# Patient Record
Sex: Male | Born: 1950 | Race: Black or African American | Hispanic: No | Smoking: Never smoker
Health system: Southern US, Community
[De-identification: ages and names within clinical notes are randomized; demographics above are authoritative.]

## PROBLEM LIST (undated history)

## (undated) ENCOUNTER — Emergency Department (HOSPITAL_COMMUNITY): Admission: EM | Payer: Medicare Other | Source: Home / Self Care

## (undated) DIAGNOSIS — G35 Multiple sclerosis: Secondary | ICD-10-CM

## (undated) DIAGNOSIS — I10 Essential (primary) hypertension: Secondary | ICD-10-CM

## (undated) DIAGNOSIS — N319 Neuromuscular dysfunction of bladder, unspecified: Secondary | ICD-10-CM

## (undated) DIAGNOSIS — Z86711 Personal history of pulmonary embolism: Secondary | ICD-10-CM

## (undated) DIAGNOSIS — G8250 Quadriplegia, unspecified: Secondary | ICD-10-CM

## (undated) HISTORY — DX: Multiple sclerosis: G35

## (undated) HISTORY — PX: TONSILLECTOMY AND ADENOIDECTOMY: SUR1326

## (undated) HISTORY — PX: OTHER SURGICAL HISTORY: SHX169

## (undated) HISTORY — DX: Neuromuscular dysfunction of bladder, unspecified: N31.9

## (undated) HISTORY — DX: Personal history of pulmonary embolism: Z86.711

---

## 1999-07-04 ENCOUNTER — Inpatient Hospital Stay (HOSPITAL_COMMUNITY): Admission: EM | Admit: 1999-07-04 | Discharge: 1999-07-07 | Payer: Self-pay | Admitting: *Deleted

## 1999-07-04 ENCOUNTER — Encounter: Payer: Self-pay | Admitting: Family Medicine

## 1999-07-06 ENCOUNTER — Encounter: Payer: Self-pay | Admitting: Internal Medicine

## 2000-03-07 ENCOUNTER — Encounter: Payer: Self-pay | Admitting: Emergency Medicine

## 2000-03-08 ENCOUNTER — Inpatient Hospital Stay (HOSPITAL_COMMUNITY): Admission: EM | Admit: 2000-03-08 | Discharge: 2000-03-09 | Payer: Self-pay | Admitting: Emergency Medicine

## 2000-12-12 ENCOUNTER — Encounter: Payer: Self-pay | Admitting: Emergency Medicine

## 2000-12-12 ENCOUNTER — Inpatient Hospital Stay (HOSPITAL_COMMUNITY): Admission: EM | Admit: 2000-12-12 | Discharge: 2000-12-21 | Payer: Self-pay | Admitting: Emergency Medicine

## 2000-12-13 ENCOUNTER — Encounter: Payer: Self-pay | Admitting: Internal Medicine

## 2000-12-17 ENCOUNTER — Encounter: Payer: Self-pay | Admitting: Internal Medicine

## 2000-12-19 ENCOUNTER — Encounter: Payer: Self-pay | Admitting: Internal Medicine

## 2001-04-18 ENCOUNTER — Ambulatory Visit (HOSPITAL_COMMUNITY): Admission: RE | Admit: 2001-04-18 | Discharge: 2001-04-18 | Payer: Self-pay | Admitting: Family Medicine

## 2001-04-18 ENCOUNTER — Encounter: Payer: Self-pay | Admitting: Family Medicine

## 2001-05-16 ENCOUNTER — Ambulatory Visit (HOSPITAL_COMMUNITY): Admission: RE | Admit: 2001-05-16 | Discharge: 2001-05-16 | Payer: Self-pay | Admitting: General Surgery

## 2002-08-21 ENCOUNTER — Encounter: Payer: Self-pay | Admitting: Emergency Medicine

## 2002-08-21 ENCOUNTER — Emergency Department (HOSPITAL_COMMUNITY): Admission: EM | Admit: 2002-08-21 | Discharge: 2002-08-21 | Payer: Self-pay | Admitting: Emergency Medicine

## 2003-04-19 ENCOUNTER — Inpatient Hospital Stay (HOSPITAL_COMMUNITY): Admission: EM | Admit: 2003-04-19 | Discharge: 2003-04-22 | Payer: Self-pay | Admitting: Emergency Medicine

## 2003-04-19 ENCOUNTER — Encounter: Payer: Self-pay | Admitting: Emergency Medicine

## 2003-04-20 ENCOUNTER — Encounter: Payer: Self-pay | Admitting: Internal Medicine

## 2003-04-21 ENCOUNTER — Encounter: Payer: Self-pay | Admitting: Internal Medicine

## 2003-07-17 ENCOUNTER — Encounter: Payer: Self-pay | Admitting: Emergency Medicine

## 2003-07-18 ENCOUNTER — Inpatient Hospital Stay (HOSPITAL_COMMUNITY): Admission: EM | Admit: 2003-07-18 | Discharge: 2003-07-22 | Payer: Self-pay | Admitting: Emergency Medicine

## 2003-07-18 ENCOUNTER — Encounter: Payer: Self-pay | Admitting: Internal Medicine

## 2003-07-21 ENCOUNTER — Encounter: Payer: Self-pay | Admitting: Internal Medicine

## 2003-10-03 ENCOUNTER — Inpatient Hospital Stay (HOSPITAL_COMMUNITY): Admission: EM | Admit: 2003-10-03 | Discharge: 2003-10-11 | Payer: Self-pay | Admitting: Internal Medicine

## 2003-10-03 ENCOUNTER — Encounter: Payer: Self-pay | Admitting: Internal Medicine

## 2005-04-23 ENCOUNTER — Inpatient Hospital Stay (HOSPITAL_COMMUNITY): Admission: EM | Admit: 2005-04-23 | Discharge: 2005-04-26 | Payer: Self-pay | Admitting: Emergency Medicine

## 2005-10-26 ENCOUNTER — Emergency Department (HOSPITAL_COMMUNITY): Admission: EM | Admit: 2005-10-26 | Discharge: 2005-10-26 | Payer: Self-pay | Admitting: Emergency Medicine

## 2006-10-24 ENCOUNTER — Emergency Department (HOSPITAL_COMMUNITY): Admission: EM | Admit: 2006-10-24 | Discharge: 2006-10-24 | Payer: Self-pay | Admitting: Emergency Medicine

## 2007-04-12 ENCOUNTER — Emergency Department (HOSPITAL_COMMUNITY): Admission: EM | Admit: 2007-04-12 | Discharge: 2007-04-12 | Payer: Self-pay | Admitting: Emergency Medicine

## 2007-10-10 ENCOUNTER — Inpatient Hospital Stay (HOSPITAL_COMMUNITY): Admission: EM | Admit: 2007-10-10 | Discharge: 2007-10-14 | Payer: Self-pay | Admitting: Emergency Medicine

## 2007-10-31 ENCOUNTER — Inpatient Hospital Stay (HOSPITAL_COMMUNITY): Admission: EM | Admit: 2007-10-31 | Discharge: 2007-11-06 | Payer: Self-pay | Admitting: Emergency Medicine

## 2007-12-03 ENCOUNTER — Emergency Department (HOSPITAL_COMMUNITY): Admission: EM | Admit: 2007-12-03 | Discharge: 2007-12-03 | Payer: Self-pay | Admitting: Emergency Medicine

## 2008-04-04 ENCOUNTER — Emergency Department (HOSPITAL_COMMUNITY): Admission: EM | Admit: 2008-04-04 | Discharge: 2008-04-04 | Payer: Self-pay | Admitting: Emergency Medicine

## 2008-04-12 ENCOUNTER — Inpatient Hospital Stay (HOSPITAL_COMMUNITY): Admission: EM | Admit: 2008-04-12 | Discharge: 2008-04-14 | Payer: Self-pay | Admitting: Emergency Medicine

## 2008-04-12 ENCOUNTER — Ambulatory Visit: Payer: Self-pay | Admitting: Nephrology

## 2008-04-25 ENCOUNTER — Inpatient Hospital Stay (HOSPITAL_COMMUNITY): Admission: EM | Admit: 2008-04-25 | Discharge: 2008-04-28 | Payer: Self-pay | Admitting: Emergency Medicine

## 2008-06-17 IMAGING — CR DG CHEST 1V PORT
1 series · 1 of 1 positions shown · non-contrast
Comparison: 10/11/2007.

CLINICAL DATA: 56 year-old with fever.  Unable to communicate.
 PORTABLE CHEST, ONE VIEW ? 10/31/2007:

[view not recorded]
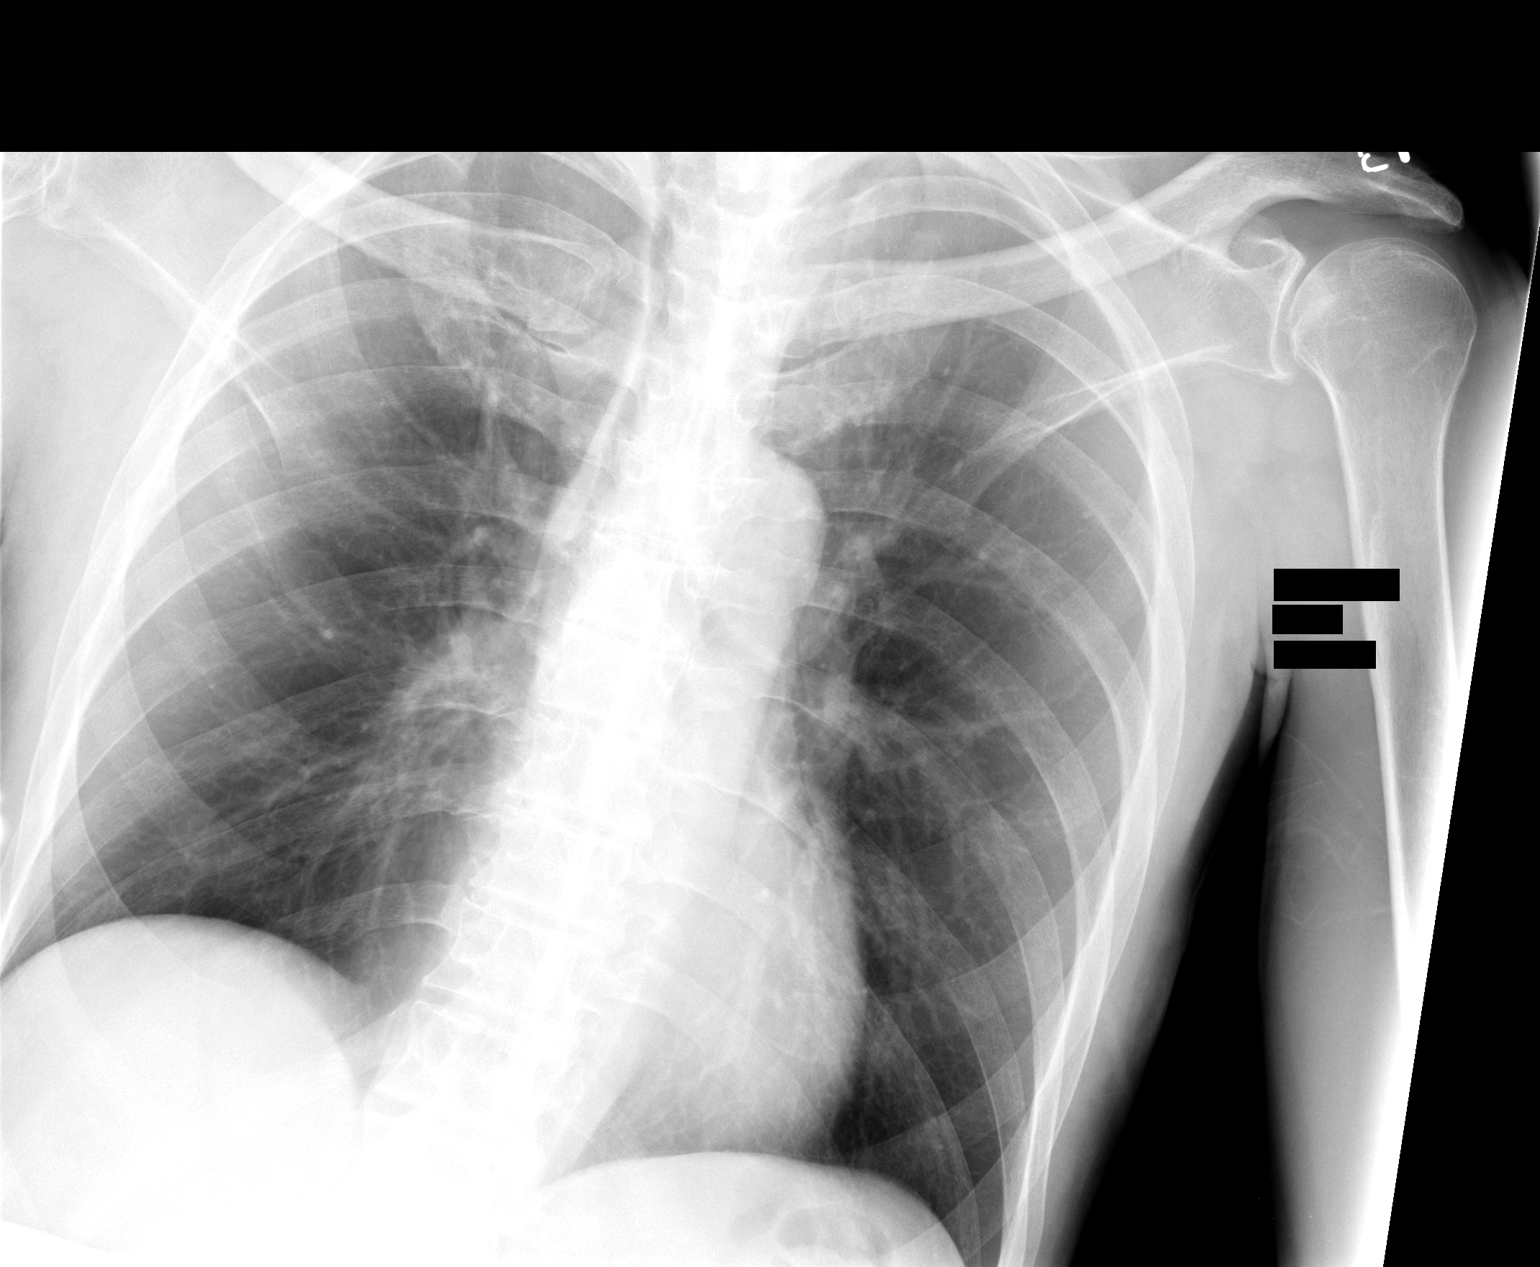

[1 of 1 positions shown; findings below may reference images not displayed]

FINDINGS: Cardiac silhouette, mediastinal and hilar contours are stable.  No acute pulmonary findings.  Bony thorax is intact.
IMPRESSION: No acute cardiopulmonary findings.

## 2008-09-14 ENCOUNTER — Inpatient Hospital Stay (HOSPITAL_COMMUNITY): Admission: EM | Admit: 2008-09-14 | Discharge: 2008-09-16 | Payer: Self-pay | Admitting: Emergency Medicine

## 2008-10-25 ENCOUNTER — Ambulatory Visit: Payer: Self-pay | Admitting: *Deleted

## 2008-10-25 ENCOUNTER — Inpatient Hospital Stay (HOSPITAL_COMMUNITY): Admission: EM | Admit: 2008-10-25 | Discharge: 2008-10-29 | Payer: Self-pay | Admitting: Emergency Medicine

## 2008-12-01 ENCOUNTER — Inpatient Hospital Stay (HOSPITAL_COMMUNITY): Admission: EM | Admit: 2008-12-01 | Discharge: 2008-12-03 | Payer: Self-pay | Admitting: Emergency Medicine

## 2008-12-19 ENCOUNTER — Inpatient Hospital Stay (HOSPITAL_COMMUNITY): Admission: EM | Admit: 2008-12-19 | Discharge: 2008-12-21 | Payer: Self-pay | Admitting: Emergency Medicine

## 2009-04-14 ENCOUNTER — Inpatient Hospital Stay (HOSPITAL_COMMUNITY): Admission: EM | Admit: 2009-04-14 | Discharge: 2009-04-15 | Payer: Self-pay | Admitting: Emergency Medicine

## 2009-10-21 ENCOUNTER — Emergency Department (HOSPITAL_COMMUNITY): Admission: EM | Admit: 2009-10-21 | Discharge: 2009-10-21 | Payer: Self-pay | Admitting: Emergency Medicine

## 2009-11-03 ENCOUNTER — Inpatient Hospital Stay (HOSPITAL_COMMUNITY): Admission: EM | Admit: 2009-11-03 | Discharge: 2009-11-09 | Payer: Self-pay | Admitting: Emergency Medicine

## 2009-11-03 ENCOUNTER — Ambulatory Visit: Payer: Self-pay | Admitting: Emergency Medicine

## 2009-11-09 ENCOUNTER — Ambulatory Visit (HOSPITAL_COMMUNITY): Admission: RE | Admit: 2009-11-09 | Discharge: 2009-11-09 | Payer: Self-pay | Admitting: Internal Medicine

## 2009-11-09 ENCOUNTER — Inpatient Hospital Stay: Admission: RE | Admit: 2009-11-09 | Discharge: 2009-11-22 | Payer: Self-pay | Admitting: Internal Medicine

## 2009-11-29 ENCOUNTER — Inpatient Hospital Stay (HOSPITAL_COMMUNITY): Admission: EM | Admit: 2009-11-29 | Discharge: 2009-12-05 | Payer: Self-pay | Admitting: Emergency Medicine

## 2009-11-30 IMAGING — CR DG CHEST 1V PORT
1 series · 1 of 1 positions shown · non-contrast
Comparison: 04/14/2009

CLINICAL DATA: Portable for placement of trach.  UTI.

PORTABLE CHEST - 1 VIEW at 1181 hours

[view not recorded]
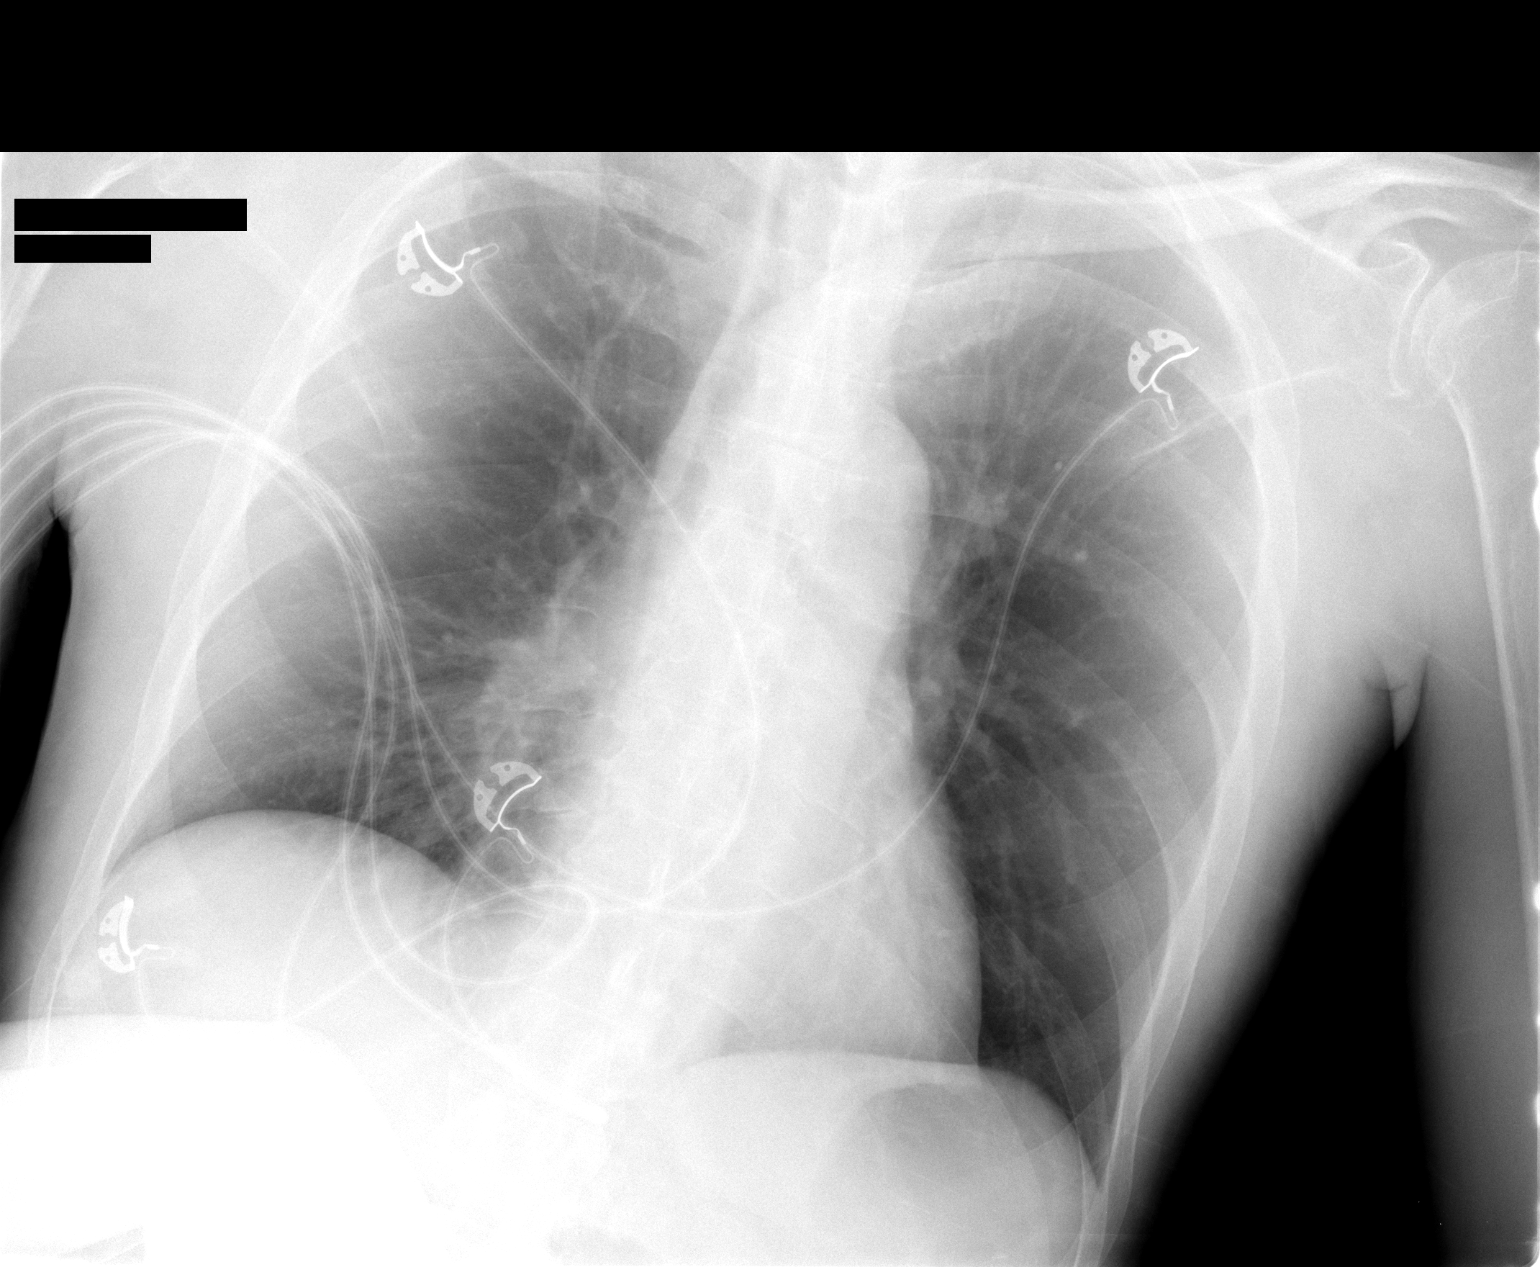

[1 of 1 positions shown; findings below may reference images not displayed]

FINDINGS: Normal cardiomediastinal silhouette.  Lungs well expanded
and clear.  No acute findings.
IMPRESSION: No acute chest abnormality.

## 2010-03-06 ENCOUNTER — Inpatient Hospital Stay (HOSPITAL_COMMUNITY): Admission: EM | Admit: 2010-03-06 | Discharge: 2010-03-09 | Payer: Self-pay | Admitting: Emergency Medicine

## 2010-04-24 ENCOUNTER — Inpatient Hospital Stay (HOSPITAL_COMMUNITY): Admission: EM | Admit: 2010-04-24 | Discharge: 2010-04-29 | Payer: Self-pay | Admitting: Emergency Medicine

## 2010-07-15 ENCOUNTER — Inpatient Hospital Stay (HOSPITAL_COMMUNITY): Admission: EM | Admit: 2010-07-15 | Discharge: 2010-07-19 | Payer: Self-pay | Admitting: Emergency Medicine

## 2010-07-19 ENCOUNTER — Encounter: Payer: Self-pay | Admitting: Family Medicine

## 2010-07-19 ENCOUNTER — Ambulatory Visit: Payer: Self-pay | Admitting: Family Medicine

## 2010-07-19 DIAGNOSIS — L89109 Pressure ulcer of unspecified part of back, unspecified stage: Secondary | ICD-10-CM

## 2010-07-19 DIAGNOSIS — I10 Essential (primary) hypertension: Secondary | ICD-10-CM

## 2010-07-19 DIAGNOSIS — R1310 Dysphagia, unspecified: Secondary | ICD-10-CM | POA: Insufficient documentation

## 2010-07-19 DIAGNOSIS — G35 Multiple sclerosis: Secondary | ICD-10-CM

## 2010-07-19 DIAGNOSIS — Z86718 Personal history of other venous thrombosis and embolism: Secondary | ICD-10-CM

## 2010-07-19 DIAGNOSIS — N390 Urinary tract infection, site not specified: Secondary | ICD-10-CM

## 2010-07-19 DIAGNOSIS — N319 Neuromuscular dysfunction of bladder, unspecified: Secondary | ICD-10-CM

## 2010-07-26 ENCOUNTER — Encounter (INDEPENDENT_AMBULATORY_CARE_PROVIDER_SITE_OTHER): Payer: Self-pay | Admitting: Pharmacist

## 2010-07-26 DIAGNOSIS — Z789 Other specified health status: Secondary | ICD-10-CM | POA: Insufficient documentation

## 2010-07-26 DIAGNOSIS — Z593 Problems related to living in residential institution: Secondary | ICD-10-CM

## 2010-07-27 ENCOUNTER — Encounter (INDEPENDENT_AMBULATORY_CARE_PROVIDER_SITE_OTHER): Payer: Self-pay | Admitting: Pharmacist

## 2010-08-03 ENCOUNTER — Telehealth: Payer: Self-pay | Admitting: Family Medicine

## 2010-08-16 ENCOUNTER — Encounter: Payer: Self-pay | Admitting: Family Medicine

## 2010-08-26 ENCOUNTER — Telehealth: Payer: Self-pay | Admitting: Family Medicine

## 2010-08-27 ENCOUNTER — Telehealth: Payer: Self-pay | Admitting: Family Medicine

## 2010-09-20 ENCOUNTER — Encounter: Payer: Self-pay | Admitting: Family Medicine

## 2010-09-21 ENCOUNTER — Inpatient Hospital Stay (HOSPITAL_COMMUNITY): Admission: EM | Admit: 2010-09-21 | Discharge: 2010-02-13 | Payer: Self-pay | Admitting: Emergency Medicine

## 2010-10-03 ENCOUNTER — Encounter: Payer: Self-pay | Admitting: Family Medicine

## 2010-10-08 ENCOUNTER — Telehealth: Payer: Self-pay | Admitting: Family Medicine

## 2010-10-08 ENCOUNTER — Inpatient Hospital Stay (HOSPITAL_COMMUNITY)
Admission: EM | Admit: 2010-10-08 | Discharge: 2010-10-12 | Payer: Self-pay | Source: Home / Self Care | Attending: Internal Medicine | Admitting: Internal Medicine

## 2010-10-18 ENCOUNTER — Encounter: Payer: Self-pay | Admitting: Family Medicine

## 2010-10-26 ENCOUNTER — Encounter: Payer: Self-pay | Admitting: Pharmacist

## 2010-11-14 NOTE — Progress Notes (Signed)
  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 Catheter is leaking cloudy urine wants order to change it also concerned about color of urine. Noted yesterday's phone note. No fever, no pain, no change in mentation no change in vitals, note pt has sensation in lower ext. Given order to change at [facility], has been done before, given 1 time dose of Vicodin 5/500mg  if needed prior to change, urine culture sent, no antibitoics given. Initial call taken by: Milinda Antis MD,  August 27, 2010 8:53 PM     Appended Document:  Better to do the culture the urine after the catheter changed to culture what is growing in the bladder. It it's proteus or providencia (stone formers) and the tube is crusted, it's reasonable to treat to allow another microbe to colonize in its place.

## 2010-11-14 NOTE — Assessment & Plan Note (Signed)
Summary: NH visit   History of Present Illness: Rev Renwick denies any acute problems or pain.  Allergies: No Known Drug Allergies  Physical Exam  General:  Alert, very dysarthric, cachetic AA male, sitting in a supportive wheel chair Eyes:  vision grossly intact, pupils equal, pupils round, pupils reactive to light, strabismus, nystagmus, and bilateral cataracts.  EOMs with significant nystagmus in all gazes, paresis of medial gaze on the right.  Msk:  flexion contractures of all extremities, tight achilles and slight off knees and hips. and severe muscle wasting. Psych:  good eye contact, saddened mood   Impression & Recommendations:  Problem # 1:  MULTIPLE SCLEROSIS (ICD-340)  Orders: Provider Misc Charge- [facility] (Misc)  Problem # 2:  DECUBITUS ULCER, SACRUM (ICD-707.03) Improving per skin care nurse [facility]  Problem # 3:  NEUROGENIC BLADDER (ZOX-096.04) Suprapubic tube [facility] [address]  Problem # 4:  HYPERTENSION, BENIGN ESSENTIAL (ICD-401.1)  His updated medication list for this problem includes:    Metoprolol Tartrate 25 Mg Tabs (Metoprolol tartrate) .Marland Kitchen... 1/2 tablet (12.5mg ) two times a day  Orders: Provider Misc Charge- [facility] (Misc)  Complete Medication List: 1)  Caltrate 600+d 600-400 Mg-unit Tabs (Calcium carbonate-vitamin d) .... Take 1 tablet by mouth two times a day 2)  Coumadin 6 Mg Tabs (Warfarin sodium) .... Daily 3)  Toviaz 8 Mg Xr24h-tab (Fesoterodine fumarate) .... Once daily 4)  Docusate Sodium 100 Mg Caps (Docusate sodium) .... One daily prn 5)  Metoprolol Tartrate 25 Mg Tabs (Metoprolol tartrate) .... 1/2 tablet (12.5mg ) two times a day   Orders Added: 1)  Provider Misc Charge- [facility] [Misc]

## 2010-11-14 NOTE — Miscellaneous (Signed)
   Vital Signs:  Patient profile:   60 year old male Weight:      114 pounds Temp:     99 degrees F Pulse rate:   87 / minute Resp:     18 per minute BP supine:   132 / 91  History of Present Illness: Lying in bed watching "The View", says that TV keeps him entertained.  He can not sit up in chair secondary to severe pain.  Spends days in bed, did the same at home.  When asked is not sure if he will be living at [facility], family on the other had planned for long term stay.   He agreed with the statement the he has a bad disease, and responded "Amen".  Nurses report hydrogel to ulcer on buttocks, with continued healing.  Noted patient is loosing weight.  He is a total feed, so will make sure this is being done for every meal.  He may need increase in nutritional supplements.  Allergies: No Known Drug Allergies  Past History:  Past Medical History: Last updated: 07/19/2010 Multiple sclerosis     -bed ridden since 1986     -  neurogenic bladder with suprapubic catheter     -  dysphagia     - decubitous ulcer Hx of Pulmonary Embolism- on Coumadin  Social History: Last updated: 07/19/2010 Undergrad degree from [employer] From [address], [employer] grad Divorced, one daughter, Fredonia Highland Never smoked, no alcohol  Past Surgical History: T & A Suprapubic urinary cath placement  Family History: Reviewed history from 07/19/2010 and no changes required. Non -contributory  Social History: Reviewed history from 07/19/2010 and no changes required. Undergrad degree from [employer] From [address], [employer] grad Divorced, one daughter, Fredonia Highland Never smoked, no alcohol  Physical Exam  General:  Alert, very dysarthric, cachetic AA male, lying in bed. Lungs:  normal respiratory effort and normal breath sounds.   Heart:  normal rate, regular rhythm, and no murmur.   Abdomen:  soft and non-tender.  + catheter site  without infection Skin:  stage 3 ulcer on right buttocks, healthy granulation tissue Psych:  good eye contact, saddened mood   Impression & Recommendations:  Problem # 1:  DECUBITUS ULCER, SACRUM (ICD-707.03) treatment effective, will need to monitor for weight loss and ensure that he is being fed. Orders: Provider Misc Charge- [facility] (Misc)  Problem # 2:  COUMADIN THERAPY (ICD-V58.61) monitoring q 2 weeks stable on current dose Orders: Provider Misc Charge- [facility] (Misc)  Problem # 3:  MULTIPLE SCLEROSIS (ICD-340) profound immobility with quadraplegia, and flexion contractures. Orders: Provider Misc Charge- [facility] (Misc)  Problem # 4:  HYPERTENSION, BENIGN ESSENTIAL (ICD-401.1) low dose beta blocker His updated medication list for this problem includes:    Metoprolol Tartrate 25 Mg Tabs (Metoprolol tartrate) .Marland Kitchen... 1/2 tablet (12.5mg ) two times a day  Complete Medication List: 1)  Caltrate 600+d 600-400 Mg-unit Tabs (Calcium carbonate-vitamin d) .... Take 1 tablet by mouth two times a day 2)  Coumadin 6 Mg Tabs (Warfarin sodium) .... Daily 3)  Toviaz 8 Mg Xr24h-tab (Fesoterodine fumarate) .... Once daily 4)  Docusate Sodium 100 Mg Caps (Docusate sodium) .... One daily prn 5)  Metoprolol Tartrate 25 Mg Tabs (Metoprolol tartrate) .... 1/2 tablet (12.5mg ) two times a day   Orders Added: 1)  Provider Misc Charge- [facility] [Misc]

## 2010-11-14 NOTE — Progress Notes (Signed)
Summary: cloudy urine  Phone Note Other Incoming   Caller: [facility] Summary of Call: Nursing Home called- stated noted cloudy urine from suprapubic catheter.  Per staff, it is normally clear.  Patient's VSS, no complaints, at baseline.  Since is suprapubic cath and unable to change catheter, asymptomatic, and she had collected sample from bagged urine I asked her to not send for urinalysis c&s.  Asked her to monitor VS, changes in condition.  Low threshold for collecting U/A, starting abx if pt has change in condition. Initial call taken by: Delbert Harness MD,  August 26, 2010 1:29 AM

## 2010-11-14 NOTE — Progress Notes (Signed)
Summary: Nursing home   Phone Note Other Incoming   Caller: [facility] RN , Sales promotion account executive Summary of Call: INR 1.8.  Coumadin 6mg  daily.   Last INR 2.3 on 07/20/10.   Kept dosing the same. recheck INR in 2 wks.  Initial call taken by: Cat Ta MD,  August 03, 2010 10:31 AM

## 2010-11-14 NOTE — Miscellaneous (Signed)
Summary: Med Update  Clinical Lists Changes  Medications: Removed medication of CIPRO 500 MG TABS (CIPROFLOXACIN HCL) one two times a day until 10/10 Changed medication from COUMADIN 6 MG TABS (WARFARIN SODIUM) to COUMADIN 6 MG TABS (WARFARIN SODIUM) daily - Signed Changed medication from TOVIAZ 8 MG XR24H-TAB (FESOTERODINE FUMARATE) to TOVIAZ 8 MG XR24H-TAB (FESOTERODINE FUMARATE) once daily - Signed Removed medication of LOPRESSOR 50 MG TABS (METOPROLOL TARTRATE) 1/2 tab of 25 mg two times a day Added new medication of METOPROLOL TARTRATE 25 MG TABS (METOPROLOL TARTRATE) 1/2 tablet (12.5mg ) two times a day - Signed Rx of COUMADIN 6 MG TABS (WARFARIN SODIUM) daily;  #1 x 0;  Signed;  Entered by: Christian Mate D;  Authorized by: Madelon Lips Pharm D;  Method used: Historical Rx of TOVIAZ 8 MG XR24H-TAB (FESOTERODINE FUMARATE) once daily;  #1 x 0;  Signed;  Entered by: Christian Mate D;  Authorized by: Madelon Lips Pharm D;  Method used: Historical Rx of METOPROLOL TARTRATE 25 MG TABS (METOPROLOL TARTRATE) 1/2 tablet (12.5mg ) two times a day;  #1 x 0;  Signed;  Entered by: Christian Mate D;  Authorized by: Madelon Lips Pharm D;  Method used: Historical    Prescriptions: METOPROLOL TARTRATE 25 MG TABS (METOPROLOL TARTRATE) 1/2 tablet (12.5mg ) two times a day  #1 x 0   Entered and Authorized by:   Christian Mate D   Signed by:   Madelon Lips Pharm D on 07/27/2010   Method used:   Historical   RxID:   [id number] TOVIAZ 8 MG XR24H-TAB (FESOTERODINE FUMARATE) once daily  #1 x 0   Entered and Authorized by:   Christian Mate D   Signed by:   Madelon Lips Pharm D on 07/27/2010   Method used:   Historical   RxID:   [id number] COUMADIN 6 MG TABS (WARFARIN SODIUM) daily  #1 x 0   Entered and Authorized by:   Christian Mate D   Signed by:   Madelon Lips Pharm D on 07/27/2010   Method used:   Historical   RxID:   [id number]

## 2010-11-14 NOTE — Miscellaneous (Signed)
Summary: Problem List Update  Clinical Lists Changes  Problems: Added new problem of PERSON LIVING IN RESIDENTIAL INSTITUTION (ICD-V60.6) Observations: Added new observation of PT ROOM#: 311  (07/26/2010 15:22)

## 2010-11-14 NOTE — Miscellaneous (Signed)
Summary: [facility] admission   Vital Signs:  Patient profile:   60 year old male O2 Sat:      96 % on Room air Temp:     98.6 degrees F oral Pulse rate:   87 / minute Resp:     18 per minute BP supine:   146 / 92  O2 Flow:  Room air  History of Present Illness: [name], being admitted to [facility] facility after a 3 days stay in the hospital for increasing weakness and urinary tract infection.  Family is no longer able to care for him and requesting long term placement.  Patient reports diagnosis at age 82 and not able to walk for years.  He has been cared for by his 82 year old Mother and his daughter.  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  He denies pain, denies double vision, he is unable to use his extremities for function, he is unable to turn himself in bed.  He denies difficulty swallowing, would like his diet changed to include more foods..  Current Medications (verified): 1)  Cipro 500 Mg Tabs (Ciprofloxacin Hcl) .... One Two Times A Day Until 10/10 2)  Caltrate 600+d 600-400 Mg-Unit  Tabs (Calcium Carbonate-Vitamin D) .... Take 1 Tablet By Mouth Two Times A Day 3)  Coumadin 6 Mg Tabs (Warfarin Sodium) 4)  Toviaz 8 Mg Xr24h-Tab (Fesoterodine Fumarate) 5)  Lopressor 50 Mg Tabs (Metoprolol Tartrate) .... 1/2 Tab of 25 Mg Two Times A Day 6)  Docusate Sodium 100 Mg Caps (Docusate Sodium) .... One Daily Prn  Allergies (verified): No Known Drug Allergies  Past History:  Family History: Last updated: 07/19/2010 Non -contributory  Past Medical History: Multiple [name]     -bed ridden since 1986     -  neurogenic bladder with suprapubic catheter     -  dysphagia     - decubitous ulcer Hx of Pulmonary Embolism- on Coumadin  Past Surgical History: T & A  Family History: Non -contributory  Social History: Undergrad degree from [employer] From  [address], [employer] grad Divorced, one daughter, Fredonia Highland Never smoked, no alcohol  Review of Systems      See HPI  Physical Exam  General:  Alert, severe dysarthria. Head:  Normocephalic and atraumatic without obvious abnormalities. No apparent alopecia or balding. Eyes:  vision grossly intact, pupils equal, pupils round, pupils reactive to light, strabismus, nystagmus, and bilateral cataracts.   Ears:  R ear normal and L ear normal.   Nose:  External nasal examination shows no deformity or inflammation. Nasal mucosa are pink and moist without lesions or exudates. Mouth:  teeth in fair condition, caries repaired Neck:  No deformities, masses, or tenderness noted. Lungs:  Bibasilar rales, otherwise clear Heart:  RRR Abdomen:  flat, non tender Genitalia:  Suprapubic bladder cath in place Msk:  flexion contractures of all extremities, tight achilles and slight off knees and hips. and severe muscle wasting. Pulses:  R and L carotid,radial,femoral,dorsalis pedis and posterior tibial pulses are full and equal bilaterally Neurologic:  alert & oriented X3.  Able to remember 3 objects in 5 minutes. EOMs with significant nystagmus in all gazes, paresis of medial gaze on the right.  Tongue midline, soft palate elevation with gag.  Occ spasms of legs.  Skin:  Hydrocolloid dressing over left ischium Cervical Nodes:  No lymphadenopathy noted Inguinal Nodes:  No significant  adenopathy Psych:  good eye contact and not depressed appearing.     Impression & Recommendations:  Problem # 1:  MULTIPLE [name] (ICD-340)  Dependent in all ADLs and immoblie and bed bound.  Appears cogniatively intact with severe dysarthria.  Orders: Provider Misc Charge- [facility] (Misc)  Problem # 2:  NEUROGENIC BLADDER [phone])  draining suprapubic cath,  completeing course of cipro for Kleb UTI  [facility] ([facility])  Problem # 3:  DYSPHAGIA UNSPECIFIED (ICD-787.20) ST to reassess  swallow, good gag to gross testing, however severe dysarthria.  Problem # 4:  DECUBITUS ULCER, SACRUM (ICD-707.03)  not observed, stage IV, measured at 2x2x0.5; plan is for hydrocolloid dressings  [facility] ([facility])  Problem # 5:  COUMADIN THERAPY (ICD-V58.61) INR at 2.3, on 6 mg coumadin, recheck 2 weeks  Problem # 6:  HYPERTENSION, BENIGN ESSENTIAL (ICD-401.1)  Monitor, BP on admission in the 140/90 range His updated medication list for this problem includes:    Lopressor 50 Mg Tabs (Metoprolol tartrate) .Marland Kitchen... 1/2 tab of 25 mg two times a day  Orders: Provider Misc Charge- [facility] (Misc)  Complete Medication List: 1)  Cipro 500 Mg Tabs (Ciprofloxacin hcl) .... One two times a day until 10/10 2)  Caltrate 600+d 600-400 Mg-unit Tabs (Calcium carbonate-vitamin d) .... Take 1 tablet by mouth two times a day 3)  Coumadin 6 Mg Tabs (Warfarin sodium) 4)  Toviaz 8 Mg Xr24h-tab (Fesoterodine fumarate) 5)  Lopressor 50 Mg Tabs (Metoprolol tartrate) .... 1/2 tab of 25 mg two times a day 6)  Docusate Sodium 100 Mg Caps (Docusate sodium) .... One daily prn

## 2010-11-16 NOTE — Miscellaneous (Signed)
Summary: med list update  Clinical Lists Changes  Medications: Changed medication from CALTRATE 600+D 600-400 MG-UNIT  TABS (CALCIUM CARBONATE-VITAMIN D) Take 1 tablet by mouth two times a day to CALTRATE 600+D 600-400 MG-UNIT  TABS (CALCIUM CARBONATE-VITAMIN D) Take 1 tablet by mouth daily Changed medication from DOCUSATE SODIUM 100 MG CAPS (DOCUSATE SODIUM) one daily prn to DOCUSATE SODIUM 100 MG CAPS (DOCUSATE SODIUM) take 1 tablet by mouth two times a day as needed

## 2010-11-16 NOTE — Miscellaneous (Signed)
   Vital Signs:  Patient profile:   60 year old male Weight:      123 pounds Pulse rate:   82 / minute Resp:     18 per minute  Primary Care Provider:  Ellery Plunk MD   History of Present Illness: Stay in hospital on hospitalist service over the holidays for urosepsis.  Treated with IV antibiotics vis PIC line, suprapubic cath changed by urology.  He reports feeling better.  Allergies: No Known Drug Allergies  Physical Exam  General:  Looked more hydrated, fuller faced, weight up 3 pounds.  Profound dysarthria. Neck:  No deformities, masses, or tenderness noted. Lungs:  normal respiratory effort and normal breath sounds.   Heart:  normal rate and regular rhythm.   Neurologic:  dysarthria, oriented, functionally quadraplegic, abnormal EOMs   Impression & Recommendations:  Problem # 1:  DYSPHAGIA UNSPECIFIED (ICD-787.20) Thickened water at bedside, staff are hydrating orally  Problem # 2:  URINARY TRACT INFECTION, RECURRENT (ICD-599.0) few more days of IV ceftriaxone then when 10 day course is complete will discontiue PIC line.  Suprapubic cath in bladder was changed by URO. His updated medication list for this problem includes:    Toviaz 8 Mg Xr24h-tab (Fesoterodine fumarate) ..... Once daily  Orders: Provider Misc Charge- [facility] (Misc)  Complete Medication List: 1)  Caltrate 600+d 600-400 Mg-unit Tabs (Calcium carbonate-vitamin d) .... Take 1 tablet by mouth two times a day 2)  Coumadin 6 Mg Tabs (Warfarin sodium) .... Daily 3)  Toviaz 8 Mg Xr24h-tab (Fesoterodine fumarate) .... Once daily 4)  Docusate Sodium 100 Mg Caps (Docusate sodium) .... One daily prn 5)  Metoprolol Tartrate 25 Mg Tabs (Metoprolol tartrate) .... 1/2 tablet (12.5mg ) two times a day   Orders Added: 1)  Provider Misc Charge- [facility] [Misc]

## 2010-11-16 NOTE — Assessment & Plan Note (Signed)
Summary: NH primary MD visit   Vital Signs:  Patient profile:   60 year old male Temp:     97.8 degrees F Pulse rate:   86 / minute Resp:     18 per minute BP sitting:   145 / 91  Primary Care Ceairra Mccarver:  Ellery Plunk MD  CC:  NH visit.  History of Present Illness: Pt seems in good spirits today.  He was being fed when I came in.  He does not have any complaints.  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    Current Medications (verified): 1)  Caltrate 600+d 600-400 Mg-Unit  Tabs (Calcium Carbonate-Vitamin D) .... Take 1 Tablet By Mouth Two Times A Day 2)  Coumadin 6 Mg Tabs (Warfarin Sodium) .... Daily 3)  Toviaz 8 Mg Xr24h-Tab (Fesoterodine Fumarate) .... Once Daily 4)  Docusate Sodium 100 Mg Caps (Docusate Sodium) .... One Daily Prn 5)  Metoprolol Tartrate 25 Mg Tabs (Metoprolol Tartrate) .... 1/2 Tablet (12.5mg ) Two Times A Day  Allergies (verified): No Known Drug Allergies  Review of Systems  The patient denies anorexia, fever, chest pain, and headaches.    Physical Exam  General:  Alert, very dysarthric, cachetic AA male, lying in bed being fed Head:  Normocephalic and atraumatic without obvious abnormalities.  Eyes:  vision grossly intact, strabismus, and bilateral cataracts.  Ears:  R ear normal and L ear normal.   Lungs:  normal respiratory effort and normal breath sounds.   Heart:  normal rate, regular rhythm, and no murmur.   Abdomen:  Bowel sounds positive,abdomen soft and non-tender without masses, organomegaly or hernias noted. Msk:  flexion contractures of all extremities, tight achilles and slight off knees and hips. and severe muscle wasting. Neurologic:  alert & oriented X3.  Good long and short term memory Psych:  good eye contact,normally interactive.     Impression & Recommendations:  Problem # 1:  HYPERTENSION, BENIGN ESSENTIAL (ICD-401.1) essentially at goal today.  No change to medications His updated medication list for this problem  includes:    Metoprolol Tartrate 25 Mg Tabs (Metoprolol tartrate) .Marland Kitchen... 1/2 tablet (12.5mg ) two times a day  Problem # 2:  PULMONARY EMBOLISM, HX OF (ICD-V12.51) on coumadin, INR followed by pharmacy His updated medication list for this problem includes:    Coumadin 6 Mg Tabs (Warfarin sodium) .Marland Kitchen... Daily  Problem # 3:  MULTIPLE SCLEROSIS (ICD-340) Assessment: Unchanged Pt with progressive symptoms now in NH for care.  Daughter and mother are close by.  Continue to monitor for progression  Complete Medication List: 1)  Caltrate 600+d 600-400 Mg-unit Tabs (Calcium carbonate-vitamin d) .... Take 1 tablet by mouth two times a day 2)  Coumadin 6 Mg Tabs (Warfarin sodium) .... Daily 3)  Toviaz 8 Mg Xr24h-tab (Fesoterodine fumarate) .... Once daily 4)  Docusate Sodium 100 Mg Caps (Docusate sodium) .... One daily prn 5)  Metoprolol Tartrate 25 Mg Tabs (Metoprolol tartrate) .... 1/2 tablet (12.5mg ) two times a day

## 2010-11-16 NOTE — Progress Notes (Signed)
  Phone Note Other Incoming   Caller: nurse at [employer] of Call: Called that pt is not feeling good, that tonight he became diaphoretic and had a heart rate of 90's to 120, pt is denying any chest discomfort but just doesn't feel right.  Pt vitals are afebrile, 145/76 HR 120 RR 16 pt feels funny per nurse.   Pt chart said he did have same complaint on the 21 st which resolved after about 2 hours.  This has been going on for 3 hours.  Told nurse that pt can be evaluated in the ED for tachycardia and diaphoresis but did not feel he needs to be admitted.  Nurse Melissa verbalized understanding.  Initial call taken by: Antoine Primas DO,  October 08, 2010 2:55 AM

## 2010-12-01 ENCOUNTER — Other Ambulatory Visit: Payer: Self-pay | Admitting: Pharmacist

## 2010-12-01 DIAGNOSIS — I10 Essential (primary) hypertension: Secondary | ICD-10-CM

## 2010-12-01 DIAGNOSIS — Z86718 Personal history of other venous thrombosis and embolism: Secondary | ICD-10-CM

## 2010-12-01 DIAGNOSIS — N319 Neuromuscular dysfunction of bladder, unspecified: Secondary | ICD-10-CM

## 2010-12-25 LAB — CBC
HCT: 40.5 % (ref 39.0–52.0)
HCT: 42.2 % (ref 39.0–52.0)
Hemoglobin: 13.5 g/dL (ref 13.0–17.0)
Hemoglobin: 14.7 g/dL (ref 13.0–17.0)
MCH: 27.7 pg (ref 26.0–34.0)
MCHC: 32 g/dL (ref 30.0–36.0)
MCHC: 32.2 g/dL (ref 30.0–36.0)
MCV: 86.2 fL (ref 78.0–100.0)
MCV: 86.7 fL (ref 78.0–100.0)
RBC: 4.7 MIL/uL (ref 4.22–5.81)
RBC: 5.32 MIL/uL (ref 4.22–5.81)
WBC: 11.1 10*3/uL — ABNORMAL HIGH (ref 4.0–10.5)
WBC: 6.2 10*3/uL (ref 4.0–10.5)

## 2010-12-25 LAB — DIFFERENTIAL
Basophils Relative: 0 % (ref 0–1)
Eosinophils Relative: 2 % (ref 0–5)
Lymphocytes Relative: 15 % (ref 12–46)
Lymphocytes Relative: 28 % (ref 12–46)
Lymphs Abs: 1.7 10*3/uL (ref 0.7–4.0)
Lymphs Abs: 1.8 10*3/uL (ref 0.7–4.0)
Monocytes Absolute: 0.6 10*3/uL (ref 0.1–1.0)
Monocytes Relative: 8 % (ref 3–12)
Monocytes Relative: 9 % (ref 3–12)
Neutro Abs: 8.4 10*3/uL — ABNORMAL HIGH (ref 1.7–7.7)
Neutrophils Relative %: 76 % (ref 43–77)

## 2010-12-25 LAB — URINALYSIS, ROUTINE W REFLEX MICROSCOPIC
Glucose, UA: NEGATIVE mg/dL
Protein, ur: >300 mg/dL — AB
Specific Gravity, Urine: 1.025 (ref 1.005–1.030)

## 2010-12-25 LAB — CARDIAC PANEL(CRET KIN+CKTOT+MB+TROPI)
CK, MB: 1.4 ng/mL (ref 0.3–4.0)
Total CK: 246 U/L — ABNORMAL HIGH (ref 7–232)
Troponin I: 0.03 ng/mL (ref 0.00–0.06)
Troponin I: 0.08 ng/mL — ABNORMAL HIGH (ref 0.00–0.06)

## 2010-12-25 LAB — PHOSPHORUS: Phosphorus: 1.9 mg/dL — ABNORMAL LOW (ref 2.3–4.6)

## 2010-12-25 LAB — BASIC METABOLIC PANEL
CO2: 26 mEq/L (ref 19–32)
Calcium: 9.6 mg/dL (ref 8.4–10.5)
Chloride: 107 mEq/L (ref 96–112)
GFR calc Af Amer: >60 mL/min (ref >60)
GFR calc non Af Amer: >60 mL/min (ref >60)
GFR calc non Af Amer: >60 mL/min (ref >60)
Glucose, Bld: 71 mg/dL (ref 70–99)
Potassium: 3.6 mEq/L (ref 3.5–5.1)
Sodium: 139 mEq/L (ref 135–145)
Sodium: 141 mEq/L (ref 135–145)

## 2010-12-25 LAB — CULTURE, BLOOD (ROUTINE X 2)
Culture  Setup Time: 201112251103
Culture: NO GROWTH
Culture: NO GROWTH

## 2010-12-25 LAB — PROTIME-INR
INR: 1.73 — ABNORMAL HIGH (ref 0.00–1.49)
INR: 2.3 — ABNORMAL HIGH (ref 0.00–1.49)
INR: 2.69 — ABNORMAL HIGH (ref 0.00–1.49)
Prothrombin Time: 25.4 seconds — ABNORMAL HIGH (ref 11.6–15.2)

## 2010-12-25 LAB — MAGNESIUM: Magnesium: 1.7 mg/dL (ref 1.5–2.5)

## 2010-12-25 LAB — TSH: TSH: 2.007 u[IU]/mL (ref 0.350–4.500)

## 2010-12-25 LAB — URINE MICROSCOPIC-ADD ON

## 2010-12-25 LAB — APTT: aPTT: 37 seconds (ref 24–37)

## 2010-12-25 LAB — URINE CULTURE

## 2010-12-25 LAB — POCT CARDIAC MARKERS: Myoglobin, poc: 159 ng/mL (ref 12–200)

## 2010-12-27 LAB — PROTIME-INR
INR: 2.19 — ABNORMAL HIGH (ref 0.00–1.49)
INR: 2.38 — ABNORMAL HIGH (ref 0.00–1.49)
INR: 2.43 — ABNORMAL HIGH (ref 0.00–1.49)
Prothrombin Time: 24.5 seconds — ABNORMAL HIGH (ref 11.6–15.2)
Prothrombin Time: 26.1 seconds — ABNORMAL HIGH (ref 11.6–15.2)
Prothrombin Time: 26.5 seconds — ABNORMAL HIGH (ref 11.6–15.2)

## 2010-12-27 LAB — CBC
Hemoglobin: 15 g/dL (ref 13.0–17.0)
Hemoglobin: 15.4 g/dL (ref 13.0–17.0)
MCH: 29 pg (ref 26.0–34.0)
MCHC: 33.2 g/dL (ref 30.0–36.0)
MCV: 87.6 fL (ref 78.0–100.0)
RBC: 5.16 MIL/uL (ref 4.22–5.81)
WBC: 7.8 10*3/uL (ref 4.0–10.5)

## 2010-12-27 LAB — DIFFERENTIAL
Eosinophils Absolute: 0.1 10*3/uL (ref 0.0–0.7)
Eosinophils Relative: 1 % (ref 0–5)
Lymphs Abs: 1.9 10*3/uL (ref 0.7–4.0)
Monocytes Absolute: 0.5 10*3/uL (ref 0.1–1.0)
Monocytes Relative: 7 % (ref 3–12)

## 2010-12-27 LAB — BASIC METABOLIC PANEL
BUN: 7 mg/dL (ref 6–23)
CO2: 24 mEq/L (ref 19–32)
GFR calc non Af Amer: >60 mL/min (ref >60)
Glucose, Bld: 88 mg/dL (ref 70–99)
Potassium: 3.7 mEq/L (ref 3.5–5.1)
Sodium: 141 mEq/L (ref 135–145)

## 2010-12-27 LAB — COMPREHENSIVE METABOLIC PANEL
ALT: 12 U/L (ref 0–53)
AST: 18 U/L (ref 0–37)
CO2: 27 mEq/L (ref 19–32)
Calcium: 9 mg/dL (ref 8.4–10.5)
GFR calc Af Amer: >60 mL/min (ref >60)
GFR calc non Af Amer: >60 mL/min (ref >60)
Sodium: 141 mEq/L (ref 135–145)
Total Protein: 7.9 g/dL (ref 6.0–8.3)

## 2010-12-27 LAB — URINE MICROSCOPIC-ADD ON

## 2010-12-27 LAB — URINE CULTURE: Culture  Setup Time: 201110012019

## 2010-12-27 LAB — URINALYSIS, ROUTINE W REFLEX MICROSCOPIC
Bilirubin Urine: NEGATIVE
Specific Gravity, Urine: 1.009 (ref 1.005–1.030)
pH: 7.5 (ref 5.0–8.0)

## 2010-12-30 LAB — PROTIME-INR
INR: 1.7 — ABNORMAL HIGH (ref 0.00–1.49)
INR: 1.94 — ABNORMAL HIGH (ref 0.00–1.49)
Prothrombin Time: 19.8 seconds — ABNORMAL HIGH (ref 11.6–15.2)

## 2010-12-30 LAB — CBC
HCT: 36.8 % — ABNORMAL LOW (ref 39.0–52.0)
HCT: 37.2 % — ABNORMAL LOW (ref 39.0–52.0)
MCHC: 33.3 g/dL (ref 30.0–36.0)
MCHC: 33.5 g/dL (ref 30.0–36.0)
Platelets: 178 10*3/uL (ref 150–400)
Platelets: 209 10*3/uL (ref 150–400)
RDW: 15.9 % — ABNORMAL HIGH (ref 11.5–15.5)
RDW: 16.1 % — ABNORMAL HIGH (ref 11.5–15.5)
WBC: 5.2 10*3/uL (ref 4.0–10.5)
WBC: 5.4 10*3/uL (ref 4.0–10.5)

## 2010-12-30 LAB — BASIC METABOLIC PANEL
BUN: 5 mg/dL — ABNORMAL LOW (ref 6–23)
Calcium: 8.8 mg/dL (ref 8.4–10.5)
Creatinine, Ser: 0.79 mg/dL (ref 0.4–1.5)
GFR calc non Af Amer: >60 mL/min (ref >60)
Glucose, Bld: 88 mg/dL (ref 70–99)

## 2010-12-31 LAB — BASIC METABOLIC PANEL
BUN: 4 mg/dL — ABNORMAL LOW (ref 6–23)
BUN: 5 mg/dL — ABNORMAL LOW (ref 6–23)
BUN: 10 mg/dL (ref 6–23)
BUN: 5 mg/dL — ABNORMAL LOW (ref 6–23)
BUN: 8 mg/dL (ref 6–23)
CO2: 20 mEq/L (ref 19–32)
CO2: 21 mEq/L (ref 19–32)
CO2: 22 mEq/L (ref 19–32)
Calcium: 8.9 mg/dL (ref 8.4–10.5)
Calcium: 7.6 mg/dL — ABNORMAL LOW (ref 8.4–10.5)
Chloride: 110 mEq/L (ref 96–112)
Chloride: 111 mEq/L (ref 96–112)
Chloride: 110 mEq/L (ref 96–112)
Chloride: 113 mEq/L — ABNORMAL HIGH (ref 96–112)
Creatinine, Ser: 0.75 mg/dL (ref 0.4–1.5)
Creatinine, Ser: 0.81 mg/dL (ref 0.4–1.5)
GFR calc Af Amer: >60 mL/min (ref >60)
GFR calc Af Amer: >60 mL/min (ref >60)
GFR calc non Af Amer: >60 mL/min (ref >60)
GFR calc non Af Amer: >60 mL/min (ref >60)
GFR calc non Af Amer: >60 mL/min (ref >60)
GFR calc non Af Amer: >60 mL/min (ref >60)
GFR calc non Af Amer: >60 mL/min (ref >60)
Glucose, Bld: 102 mg/dL — ABNORMAL HIGH (ref 70–99)
Glucose, Bld: 92 mg/dL (ref 70–99)
Glucose, Bld: 95 mg/dL (ref 70–99)
Glucose, Bld: 119 mg/dL — ABNORMAL HIGH (ref 70–99)
Glucose, Bld: 89 mg/dL (ref 70–99)
Potassium: 3.4 mEq/L — ABNORMAL LOW (ref 3.5–5.1)
Potassium: 3.7 mEq/L (ref 3.5–5.1)
Potassium: 3.4 mEq/L — ABNORMAL LOW (ref 3.5–5.1)
Potassium: 3.9 mEq/L (ref 3.5–5.1)
Potassium: 4.1 mEq/L (ref 3.5–5.1)
Sodium: 135 mEq/L (ref 135–145)
Sodium: 140 mEq/L (ref 135–145)
Sodium: 142 mEq/L (ref 135–145)
Sodium: 140 mEq/L (ref 135–145)
Sodium: 141 mEq/L (ref 135–145)

## 2010-12-31 LAB — URINE MICROSCOPIC-ADD ON

## 2010-12-31 LAB — CBC
HCT: 40.1 % (ref 39.0–52.0)
HCT: 40.1 % (ref 39.0–52.0)
HCT: 39.6 % (ref 39.0–52.0)
HCT: 39.8 % (ref 39.0–52.0)
HCT: 50.9 % (ref 39.0–52.0)
Hemoglobin: 12.7 g/dL — ABNORMAL LOW (ref 13.0–17.0)
Hemoglobin: 13.5 g/dL (ref 13.0–17.0)
Hemoglobin: 13.6 g/dL (ref 13.0–17.0)
Hemoglobin: 13.1 g/dL (ref 13.0–17.0)
Hemoglobin: 13.1 g/dL (ref 13.0–17.0)
Hemoglobin: 16.2 g/dL (ref 13.0–17.0)
Hemoglobin: 13.9 g/dL (ref 13.0–17.0)
MCH: 29.2 pg (ref 26.0–34.0)
MCH: 29.4 pg (ref 26.0–34.0)
MCH: 29.5 pg (ref 26.0–34.0)
MCHC: 33.6 g/dL (ref 30.0–36.0)
MCHC: 31.9 g/dL (ref 30.0–36.0)
MCHC: 33 g/dL (ref 30.0–36.0)
MCHC: 33.2 g/dL (ref 30.0–36.0)
MCHC: 33.2 g/dL (ref 30.0–36.0)
MCV: 86.7 fL (ref 78.0–100.0)
MCV: 87.2 fL (ref 78.0–100.0)
MCV: 87.6 fL (ref 78.0–100.0)
MCV: 88.9 fL (ref 78.0–100.0)
MCV: 89.6 fL (ref 78.0–100.0)
Platelets: 150 10*3/uL (ref 150–400)
Platelets: 156 10*3/uL (ref 150–400)
Platelets: 173 10*3/uL (ref 150–400)
Platelets: 105 10*3/uL — ABNORMAL LOW (ref 150–400)
Platelets: 107 10*3/uL — ABNORMAL LOW (ref 150–400)
Platelets: 164 10*3/uL (ref 150–400)
RBC: 4.14 MIL/uL — ABNORMAL LOW (ref 4.22–5.81)
RBC: 4.32 MIL/uL (ref 4.22–5.81)
RBC: 4.58 MIL/uL (ref 4.22–5.81)
RBC: 4.46 MIL/uL (ref 4.22–5.81)
RBC: 4.47 MIL/uL (ref 4.22–5.81)
RBC: 4.61 MIL/uL (ref 4.22–5.81)
RBC: 4.75 MIL/uL (ref 4.22–5.81)
RDW: 16.1 % — ABNORMAL HIGH (ref 11.5–15.5)
RDW: 14.9 % (ref 11.5–15.5)
RDW: 15.3 % (ref 11.5–15.5)
RDW: 15.4 % (ref 11.5–15.5)
WBC: 12.1 10*3/uL — ABNORMAL HIGH (ref 4.0–10.5)
WBC: 7.9 10*3/uL (ref 4.0–10.5)
WBC: 8 10*3/uL (ref 4.0–10.5)
WBC: 11.6 10*3/uL — ABNORMAL HIGH (ref 4.0–10.5)
WBC: 7.5 10*3/uL (ref 4.0–10.5)
WBC: 6.9 10*3/uL (ref 4.0–10.5)
WBC: 7.7 10*3/uL (ref 4.0–10.5)

## 2010-12-31 LAB — VITAMIN B12: Vitamin B-12: 499 pg/mL (ref 211–911)

## 2010-12-31 LAB — DIFFERENTIAL
Basophils Absolute: 0 10*3/uL (ref 0.0–0.1)
Basophils Absolute: 0 10*3/uL (ref 0.0–0.1)
Basophils Relative: 1 % (ref 0–1)
Eosinophils Absolute: 0 10*3/uL (ref 0.0–0.7)
Eosinophils Absolute: 0.1 10*3/uL (ref 0.0–0.7)
Eosinophils Absolute: 0.2 10*3/uL (ref 0.0–0.7)
Eosinophils Relative: 1 % (ref 0–5)
Eosinophils Relative: 2 % (ref 0–5)
Eosinophils Relative: 3 % (ref 0–5)
Lymphocytes Relative: 10 % — ABNORMAL LOW (ref 12–46)
Lymphocytes Relative: 18 % (ref 12–46)
Lymphocytes Relative: 22 % (ref 12–46)
Lymphocytes Relative: 33 % (ref 12–46)
Lymphs Abs: 1.2 10*3/uL (ref 0.7–4.0)
Lymphs Abs: 1.4 10*3/uL (ref 0.7–4.0)
Lymphs Abs: 1.8 10*3/uL (ref 0.7–4.0)
Lymphs Abs: 0.7 10*3/uL (ref 0.7–4.0)
Lymphs Abs: 2.1 10*3/uL (ref 0.7–4.0)
Lymphs Abs: 2.6 10*3/uL (ref 0.7–4.0)
Monocytes Absolute: 0.6 10*3/uL (ref 0.1–1.0)
Monocytes Absolute: 1.3 10*3/uL — ABNORMAL HIGH (ref 0.1–1.0)
Monocytes Absolute: 0.7 10*3/uL (ref 0.1–1.0)
Monocytes Relative: 12 % (ref 3–12)
Monocytes Relative: 5 % (ref 3–12)
Neutro Abs: 10.2 10*3/uL — ABNORMAL HIGH (ref 1.7–7.7)
Neutro Abs: 5.5 10*3/uL (ref 1.7–7.7)
Neutro Abs: 19.8 10*3/uL — ABNORMAL HIGH (ref 1.7–7.7)

## 2010-12-31 LAB — COMPREHENSIVE METABOLIC PANEL
ALT: 17 U/L (ref 0–53)
ALT: 69 U/L — ABNORMAL HIGH (ref 0–53)
ALT: 93 U/L — ABNORMAL HIGH (ref 0–53)
AST: 31 U/L (ref 0–37)
AST: 68 U/L — ABNORMAL HIGH (ref 0–37)
Albumin: 3.3 g/dL — ABNORMAL LOW (ref 3.5–5.2)
Alkaline Phosphatase: 64 U/L (ref 39–117)
Alkaline Phosphatase: 44 U/L (ref 39–117)
Alkaline Phosphatase: 45 U/L (ref 39–117)
Alkaline Phosphatase: 50 U/L (ref 39–117)
BUN: 6 mg/dL (ref 6–23)
BUN: 3 mg/dL — ABNORMAL LOW (ref 6–23)
BUN: 4 mg/dL — ABNORMAL LOW (ref 6–23)
CO2: 24 mEq/L (ref 19–32)
CO2: 24 mEq/L (ref 19–32)
CO2: 26 mEq/L (ref 19–32)
Calcium: 8.4 mg/dL (ref 8.4–10.5)
Calcium: 8.7 mg/dL (ref 8.4–10.5)
Calcium: 9.2 mg/dL (ref 8.4–10.5)
Chloride: 106 mEq/L (ref 96–112)
Chloride: 108 mEq/L (ref 96–112)
Creatinine, Ser: 0.8 mg/dL (ref 0.4–1.5)
Creatinine, Ser: 0.68 mg/dL (ref 0.4–1.5)
GFR calc Af Amer: >60 mL/min (ref >60)
GFR calc Af Amer: >60 mL/min (ref >60)
GFR calc Af Amer: >60 mL/min (ref >60)
GFR calc non Af Amer: >60 mL/min (ref >60)
GFR calc non Af Amer: >60 mL/min (ref >60)
GFR calc non Af Amer: >60 mL/min (ref >60)
GFR calc non Af Amer: >60 mL/min (ref >60)
GFR calc non Af Amer: >60 mL/min (ref >60)
Glucose, Bld: 69 mg/dL — ABNORMAL LOW (ref 70–99)
Glucose, Bld: 85 mg/dL (ref 70–99)
Glucose, Bld: 86 mg/dL (ref 70–99)
Glucose, Bld: 92 mg/dL (ref 70–99)
Potassium: 3.9 mEq/L (ref 3.5–5.1)
Potassium: 3.3 mEq/L — ABNORMAL LOW (ref 3.5–5.1)
Potassium: 4 mEq/L (ref 3.5–5.1)
Sodium: 139 mEq/L (ref 135–145)
Sodium: 142 mEq/L (ref 135–145)
Sodium: 137 mEq/L (ref 135–145)
Sodium: 142 mEq/L (ref 135–145)
Total Bilirubin: 1.4 mg/dL — ABNORMAL HIGH (ref 0.3–1.2)
Total Bilirubin: 0.4 mg/dL (ref 0.3–1.2)
Total Bilirubin: 0.3 mg/dL (ref 0.3–1.2)
Total Protein: 7 g/dL (ref 6.0–8.3)

## 2010-12-31 LAB — URINALYSIS, ROUTINE W REFLEX MICROSCOPIC
Bilirubin Urine: NEGATIVE
Bilirubin Urine: NEGATIVE
Glucose, UA: NEGATIVE mg/dL
Glucose, UA: NEGATIVE mg/dL
Ketones, ur: 40 mg/dL — AB
Ketones, ur: NEGATIVE mg/dL
Nitrite: NEGATIVE
Nitrite: NEGATIVE
Protein, ur: 30 mg/dL — AB
Specific Gravity, Urine: 1.008 (ref 1.005–1.030)
pH: 7.5 (ref 5.0–8.0)
pH: 7 (ref 5.0–8.0)
pH: 8 (ref 5.0–8.0)

## 2010-12-31 LAB — BLOOD GAS, ARTERIAL
Bicarbonate: 21.3 mEq/L (ref 20.0–24.0)
Drawn by: 129801
O2 Content: 2 L/min
O2 Saturation: 99.1 %
Patient temperature: 98.6
pH, Arterial: 7.378 (ref 7.350–7.450)
pO2, Arterial: 147 mmHg — ABNORMAL HIGH (ref 80.0–100.0)

## 2010-12-31 LAB — MAGNESIUM
Magnesium: 1.8 mg/dL (ref 1.5–2.5)
Magnesium: 1.4 mg/dL — ABNORMAL LOW (ref 1.5–2.5)
Magnesium: 2 mg/dL (ref 1.5–2.5)
Magnesium: 2 mg/dL (ref 1.5–2.5)

## 2010-12-31 LAB — URINE CULTURE
Colony Count: >=100000
Colony Count: >=100000

## 2010-12-31 LAB — PROTIME-INR
INR: 1.17 (ref 0.00–1.49)
Prothrombin Time: 14.8 seconds (ref 11.6–15.2)
Prothrombin Time: 14.9 seconds (ref 11.6–15.2)

## 2010-12-31 LAB — POCT I-STAT, CHEM 8
Chloride: 114 mEq/L — ABNORMAL HIGH (ref 96–112)
Creatinine, Ser: 1.4 mg/dL (ref 0.4–1.5)
Glucose, Bld: 123 mg/dL — ABNORMAL HIGH (ref 70–99)
Potassium: 3.7 mEq/L (ref 3.5–5.1)
Sodium: 145 mEq/L (ref 135–145)

## 2010-12-31 LAB — FERRITIN: Ferritin: 90 ng/mL (ref 22–322)

## 2010-12-31 LAB — GLUCOSE, CAPILLARY: Glucose-Capillary: 125 mg/dL — ABNORMAL HIGH (ref 70–99)

## 2010-12-31 LAB — LACTIC ACID, PLASMA: Lactic Acid, Venous: 3.6 mmol/L — ABNORMAL HIGH (ref 0.5–2.2)

## 2010-12-31 LAB — CULTURE, BLOOD (ROUTINE X 2)

## 2010-12-31 LAB — IRON AND TIBC
Saturation Ratios: 41 % (ref 20–55)
TIBC: 258 ug/dL (ref 215–435)
UIBC: 153 ug/dL

## 2010-12-31 LAB — T4, FREE: Free T4: 1.11 ng/dL (ref 0.80–1.80)

## 2010-12-31 LAB — TSH
TSH: 0.82 u[IU]/mL (ref 0.350–4.500)
TSH: 2.677 u[IU]/mL (ref 0.350–4.500)

## 2010-12-31 LAB — D-DIMER, QUANTITATIVE: D-Dimer, Quant: 0.51 ug/mL-FEU — ABNORMAL HIGH (ref 0.00–0.48)

## 2010-12-31 LAB — HEPATITIS B SURFACE ANTIBODY,QUALITATIVE: Hep B S Ab: POSITIVE — AB

## 2010-12-31 LAB — CK TOTAL AND CKMB (NOT AT ARMC)
CK, MB: 0.5 ng/mL (ref 0.3–4.0)
Relative Index: INVALID (ref 0.0–2.5)

## 2010-12-31 LAB — HEPATITIS C ANTIBODY: HCV Ab: NEGATIVE

## 2010-12-31 LAB — BRAIN NATRIURETIC PEPTIDE: Pro B Natriuretic peptide (BNP): <30 pg/mL (ref 0.0–100.0)

## 2010-12-31 LAB — HEPARIN LEVEL (UNFRACTIONATED): Heparin Unfractionated: 1.23 IU/mL — ABNORMAL HIGH (ref 0.30–0.70)

## 2010-12-31 LAB — HEPATITIS B SURFACE ANTIGEN: Hepatitis B Surface Ag: NEGATIVE

## 2010-12-31 LAB — PHOSPHORUS: Phosphorus: 2.6 mg/dL (ref 2.3–4.6)

## 2011-01-01 LAB — CBC
HCT: 37.5 % — ABNORMAL LOW (ref 39.0–52.0)
HCT: 47 % (ref 39.0–52.0)
Hemoglobin: 12.4 g/dL — ABNORMAL LOW (ref 13.0–17.0)
Hemoglobin: 15.4 g/dL (ref 13.0–17.0)
MCHC: 32.6 g/dL (ref 30.0–36.0)
MCHC: 33.2 g/dL (ref 30.0–36.0)
MCV: 89.1 fL (ref 78.0–100.0)
Platelets: 131 10*3/uL — ABNORMAL LOW (ref 150–400)
Platelets: 131 10*3/uL — ABNORMAL LOW (ref 150–400)
RBC: 4.27 MIL/uL (ref 4.22–5.81)
RDW: 15.5 % (ref 11.5–15.5)
RDW: 15.6 % — ABNORMAL HIGH (ref 11.5–15.5)
WBC: 8 10*3/uL (ref 4.0–10.5)

## 2011-01-01 LAB — URINE MICROSCOPIC-ADD ON

## 2011-01-01 LAB — CULTURE, BLOOD (ROUTINE X 2): Culture: NO GROWTH

## 2011-01-01 LAB — POCT I-STAT, CHEM 8
Calcium, Ion: 1.07 mmol/L — ABNORMAL LOW (ref 1.12–1.32)
Chloride: 111 mEq/L (ref 96–112)
Glucose, Bld: 79 mg/dL (ref 70–99)
HCT: 50 % (ref 39.0–52.0)
Hemoglobin: 17 g/dL (ref 13.0–17.0)

## 2011-01-01 LAB — BASIC METABOLIC PANEL
BUN: 5 mg/dL — ABNORMAL LOW (ref 6–23)
CO2: 20 mEq/L (ref 19–32)
Calcium: 8 mg/dL — ABNORMAL LOW (ref 8.4–10.5)
Calcium: 8.6 mg/dL (ref 8.4–10.5)
Chloride: 112 mEq/L (ref 96–112)
Creatinine, Ser: 1.02 mg/dL (ref 0.4–1.5)
GFR calc Af Amer: >60 mL/min (ref >60)
GFR calc Af Amer: >60 mL/min (ref >60)
GFR calc non Af Amer: >60 mL/min (ref >60)
GFR calc non Af Amer: >60 mL/min (ref >60)
GFR calc non Af Amer: >60 mL/min (ref >60)
Glucose, Bld: 91 mg/dL (ref 70–99)
Potassium: 3.5 mEq/L (ref 3.5–5.1)
Potassium: 4.1 mEq/L (ref 3.5–5.1)
Sodium: 137 mEq/L (ref 135–145)
Sodium: 141 mEq/L (ref 135–145)

## 2011-01-01 LAB — URINE CULTURE

## 2011-01-01 LAB — URINALYSIS, ROUTINE W REFLEX MICROSCOPIC
Bilirubin Urine: NEGATIVE
Glucose, UA: NEGATIVE mg/dL
Protein, ur: NEGATIVE mg/dL
Specific Gravity, Urine: 1.02 (ref 1.005–1.030)

## 2011-01-01 LAB — DIFFERENTIAL
Basophils Absolute: 0 10*3/uL (ref 0.0–0.1)
Basophils Relative: 0 % (ref 0–1)
Eosinophils Absolute: 0 10*3/uL (ref 0.0–0.7)
Eosinophils Relative: 0 % (ref 0–5)
Monocytes Absolute: 0.6 10*3/uL (ref 0.1–1.0)

## 2011-01-02 LAB — CBC
HCT: 40 % (ref 39.0–52.0)
HCT: 40.6 % (ref 39.0–52.0)
HCT: 46.2 % (ref 39.0–52.0)
Hemoglobin: 13.2 g/dL (ref 13.0–17.0)
MCHC: 32.6 g/dL (ref 30.0–36.0)
MCHC: 32.4 g/dL (ref 30.0–36.0)
MCHC: 32.7 g/dL (ref 30.0–36.0)
MCV: 89.3 fL (ref 78.0–100.0)
MCV: 89.6 fL (ref 78.0–100.0)
MCV: 89.8 fL (ref 78.0–100.0)
MCV: 90.3 fL (ref 78.0–100.0)
Platelets: 190 10*3/uL (ref 150–400)
Platelets: 187 10*3/uL (ref 150–400)
Platelets: 210 10*3/uL (ref 150–400)
RBC: 4.48 MIL/uL (ref 4.22–5.81)
RBC: 4.5 MIL/uL (ref 4.22–5.81)
RBC: 4.63 MIL/uL (ref 4.22–5.81)
RDW: 14.9 % (ref 11.5–15.5)
WBC: 7.9 10*3/uL (ref 4.0–10.5)
WBC: 6.9 10*3/uL (ref 4.0–10.5)
WBC: 8.7 10*3/uL (ref 4.0–10.5)

## 2011-01-02 LAB — BASIC METABOLIC PANEL
BUN: 7 mg/dL (ref 6–23)
CO2: 24 mEq/L (ref 19–32)
Chloride: 110 mEq/L (ref 96–112)
Potassium: 4 mEq/L (ref 3.5–5.1)

## 2011-01-02 LAB — URINALYSIS, ROUTINE W REFLEX MICROSCOPIC
Bilirubin Urine: NEGATIVE
Ketones, ur: NEGATIVE mg/dL
Nitrite: POSITIVE — AB
Protein, ur: 30 mg/dL — AB
Protein, ur: NEGATIVE mg/dL
Specific Gravity, Urine: 1.03 (ref 1.005–1.030)
Urobilinogen, UA: 0.2 mg/dL (ref 0.0–1.0)
pH: 6 (ref 5.0–8.0)
pH: 6.5 (ref 5.0–8.0)

## 2011-01-02 LAB — COMPREHENSIVE METABOLIC PANEL
AST: 16 U/L (ref 0–37)
Albumin: 3.8 g/dL (ref 3.5–5.2)
BUN: 11 mg/dL (ref 6–23)
BUN: 12 mg/dL (ref 6–23)
CO2: 26 mEq/L (ref 19–32)
Chloride: 111 mEq/L (ref 96–112)
Creatinine, Ser: 0.92 mg/dL (ref 0.4–1.5)
Creatinine, Ser: 1 mg/dL (ref 0.4–1.5)
GFR calc non Af Amer: >60 mL/min (ref >60)
Glucose, Bld: 85 mg/dL (ref 70–99)
Total Bilirubin: 0.5 mg/dL (ref 0.3–1.2)
Total Protein: 8.8 g/dL — ABNORMAL HIGH (ref 6.0–8.3)

## 2011-01-02 LAB — URINE CULTURE: Colony Count: >=100000

## 2011-01-02 LAB — DIFFERENTIAL
Lymphocytes Relative: 5 % — ABNORMAL LOW (ref 12–46)
Lymphs Abs: 0.7 10*3/uL (ref 0.7–4.0)
Monocytes Absolute: 0.6 10*3/uL (ref 0.1–1.0)
Monocytes Relative: 4 % (ref 3–12)
Neutro Abs: 13.4 10*3/uL — ABNORMAL HIGH (ref 1.7–7.7)

## 2011-01-02 LAB — URINE MICROSCOPIC-ADD ON

## 2011-01-02 LAB — T4, FREE: Free T4: 1.04 ng/dL (ref 0.80–1.80)

## 2011-01-02 LAB — TSH: TSH: 2.542 u[IU]/mL (ref 0.350–4.500)

## 2011-01-03 LAB — PROTIME-INR
INR: 1.03 (ref 0.00–1.49)
Prothrombin Time: 13.4 seconds (ref 11.6–15.2)

## 2011-01-03 LAB — CBC
HCT: 44.7 % (ref 39.0–52.0)
HCT: 48.4 % (ref 39.0–52.0)
Hemoglobin: 13.1 g/dL (ref 13.0–17.0)
Hemoglobin: 15.1 g/dL (ref 13.0–17.0)
Hemoglobin: 14.1 g/dL (ref 13.0–17.0)
MCHC: 33.8 g/dL (ref 30.0–36.0)
MCHC: 34.1 g/dL (ref 30.0–36.0)
MCHC: 32.8 g/dL (ref 30.0–36.0)
MCHC: 33.8 g/dL (ref 30.0–36.0)
MCV: 89.2 fL (ref 78.0–100.0)
MCV: 90.2 fL (ref 78.0–100.0)
Platelets: 131 10*3/uL — ABNORMAL LOW (ref 150–400)
Platelets: 168 10*3/uL (ref 150–400)
Platelets: 240 10*3/uL (ref 150–400)
RBC: 4.03 MIL/uL — ABNORMAL LOW (ref 4.22–5.81)
RBC: 4.27 MIL/uL (ref 4.22–5.81)
RBC: 5.01 MIL/uL (ref 4.22–5.81)
RBC: 5.37 MIL/uL (ref 4.22–5.81)
RDW: 15.7 % — ABNORMAL HIGH (ref 11.5–15.5)
RDW: 15.8 % — ABNORMAL HIGH (ref 11.5–15.5)
WBC: 7.8 10*3/uL (ref 4.0–10.5)
WBC: 8.3 10*3/uL (ref 4.0–10.5)
WBC: 7.6 10*3/uL (ref 4.0–10.5)

## 2011-01-03 LAB — COMPREHENSIVE METABOLIC PANEL
ALT: 23 U/L (ref 0–53)
ALT: 38 U/L (ref 0–53)
AST: 17 U/L (ref 0–37)
AST: 22 U/L (ref 0–37)
Albumin: 3.8 g/dL (ref 3.5–5.2)
Alkaline Phosphatase: 63 U/L (ref 39–117)
BUN: 13 mg/dL (ref 6–23)
CO2: 24 mEq/L (ref 19–32)
CO2: 24 mEq/L (ref 19–32)
Calcium: 9.4 mg/dL (ref 8.4–10.5)
Calcium: 10.2 mg/dL (ref 8.4–10.5)
Chloride: 107 mEq/L (ref 96–112)
Creatinine, Ser: 1.07 mg/dL (ref 0.4–1.5)
GFR calc Af Amer: >60 mL/min (ref >60)
GFR calc Af Amer: >60 mL/min (ref >60)
GFR calc non Af Amer: >60 mL/min (ref >60)
Glucose, Bld: 100 mg/dL — ABNORMAL HIGH (ref 70–99)
Potassium: 3.8 mEq/L (ref 3.5–5.1)
Potassium: 4.6 mEq/L (ref 3.5–5.1)
Sodium: 139 mEq/L (ref 135–145)
Sodium: 138 mEq/L (ref 135–145)
Total Bilirubin: 1.1 mg/dL (ref 0.3–1.2)
Total Protein: 7.9 g/dL (ref 6.0–8.3)
Total Protein: 8.3 g/dL (ref 6.0–8.3)

## 2011-01-03 LAB — CULTURE, BLOOD (ROUTINE X 2)
Culture: NO GROWTH
Culture: NO GROWTH

## 2011-01-03 LAB — URINALYSIS, ROUTINE W REFLEX MICROSCOPIC
Glucose, UA: NEGATIVE mg/dL
Protein, ur: 30 mg/dL — AB
Urobilinogen, UA: 1 mg/dL (ref 0.0–1.0)

## 2011-01-03 LAB — GLUCOSE, CAPILLARY
Glucose-Capillary: 82 mg/dL (ref 70–99)
Glucose-Capillary: 85 mg/dL (ref 70–99)
Glucose-Capillary: 95 mg/dL (ref 70–99)
Glucose-Capillary: 99 mg/dL (ref 70–99)

## 2011-01-03 LAB — BASIC METABOLIC PANEL
BUN: 10 mg/dL (ref 6–23)
BUN: 7 mg/dL (ref 6–23)
CO2: 21 mEq/L (ref 19–32)
CO2: 23 mEq/L (ref 19–32)
CO2: 27 mEq/L (ref 19–32)
Calcium: 8.6 mg/dL (ref 8.4–10.5)
Calcium: 9.3 mg/dL (ref 8.4–10.5)
Chloride: 112 mEq/L (ref 96–112)
Chloride: 113 mEq/L — ABNORMAL HIGH (ref 96–112)
Chloride: 116 mEq/L — ABNORMAL HIGH (ref 96–112)
Creatinine, Ser: 1.06 mg/dL (ref 0.4–1.5)
Creatinine, Ser: 1.25 mg/dL (ref 0.4–1.5)
Creatinine, Ser: 0.77 mg/dL (ref 0.4–1.5)
GFR calc Af Amer: >60 mL/min (ref >60)
GFR calc Af Amer: >60 mL/min (ref >60)
GFR calc Af Amer: >60 mL/min (ref >60)
GFR calc Af Amer: >60 mL/min (ref >60)
GFR calc non Af Amer: 60 mL/min — ABNORMAL LOW (ref >60)
GFR calc non Af Amer: >60 mL/min (ref >60)
Glucose, Bld: 85 mg/dL (ref 70–99)
Glucose, Bld: 85 mg/dL (ref 70–99)
Potassium: 3.5 mEq/L (ref 3.5–5.1)
Potassium: 3.6 mEq/L (ref 3.5–5.1)
Potassium: 3.8 mEq/L (ref 3.5–5.1)
Sodium: 142 mEq/L (ref 135–145)
Sodium: 143 mEq/L (ref 135–145)
Sodium: 137 mEq/L (ref 135–145)

## 2011-01-03 LAB — DIFFERENTIAL
Basophils Absolute: 0 10*3/uL (ref 0.0–0.1)
Eosinophils Absolute: 0.1 10*3/uL (ref 0.0–0.7)
Eosinophils Relative: 0 % (ref 0–5)
Eosinophils Relative: 1 % (ref 0–5)
Lymphocytes Relative: 5 % — ABNORMAL LOW (ref 12–46)
Lymphs Abs: 0.4 10*3/uL — ABNORMAL LOW (ref 0.7–4.0)
Lymphs Abs: 2.4 10*3/uL (ref 0.7–4.0)
Monocytes Relative: 12 % (ref 3–12)
Neutro Abs: 7.4 10*3/uL (ref 1.7–7.7)

## 2011-01-03 LAB — LACTIC ACID, PLASMA: Lactic Acid, Venous: 1.9 mmol/L (ref 0.5–2.2)

## 2011-01-03 LAB — BLOOD GAS, ARTERIAL
Acid-Base Excess: 0.6 mmol/L (ref 0.0–2.0)
Bicarbonate: 23.4 mEq/L (ref 20.0–24.0)
O2 Saturation: 99 %
Patient temperature: 105.3
TCO2: 20.3 mmol/L (ref 0–100)
pO2, Arterial: 149 mmHg — ABNORMAL HIGH (ref 80.0–100.0)

## 2011-01-03 LAB — URINE MICROSCOPIC-ADD ON

## 2011-01-03 LAB — URINE CULTURE

## 2011-01-03 LAB — MRSA PCR SCREENING: MRSA by PCR: NEGATIVE

## 2011-01-08 ENCOUNTER — Non-Acute Institutional Stay: Payer: Self-pay | Admitting: Family Medicine

## 2011-01-08 DIAGNOSIS — G35 Multiple sclerosis: Secondary | ICD-10-CM

## 2011-01-08 DIAGNOSIS — I10 Essential (primary) hypertension: Secondary | ICD-10-CM

## 2011-01-08 DIAGNOSIS — N319 Neuromuscular dysfunction of bladder, unspecified: Secondary | ICD-10-CM

## 2011-01-12 ENCOUNTER — Non-Acute Institutional Stay: Payer: Self-pay | Admitting: Family Medicine

## 2011-01-16 ENCOUNTER — Encounter: Payer: Self-pay | Admitting: Family Medicine

## 2011-01-16 NOTE — Assessment & Plan Note (Signed)
Last BPs under good control, continue current regimen

## 2011-01-16 NOTE — Progress Notes (Unsigned)
  Subjective:    Patient ID: [name], male    DOB: Aug 20, 1951, 60 y.o.   MRN: [id number]  HPI  Visited with patient in his room.  Pt reports good spirits and that he is eating well.  He feels that he is well treated in this NH.  Reports some changes in urine that have resolved.  Review of Systems Denies CP, diarrhea, SOB    Objective:   Physical Exam    Vital signs reviewed General appearance - alert, and in no distress and oriented to person, place, and time--thin appearing Heart - normal rate, regular rhythm, normal S1, S2, no murmurs, rubs, clicks or gallops Chest - clear to auscultation, no wheezes, rales or rhonchi, symmetric air entry, no tachypnea, retractions or cyanosis Abdomen - soft, nontender, nondistended, no masses or organomegaly Msk:  flexion contractures of all extremities and severe muscle wasting.     Assessment & Plan:

## 2011-01-16 NOTE — Assessment & Plan Note (Signed)
With suprapubic catheter.  UTI resolved.

## 2011-01-16 NOTE — Assessment & Plan Note (Signed)
Bedridden.  Unable to perform ADLs.

## 2011-01-17 ENCOUNTER — Other Ambulatory Visit: Payer: Self-pay | Admitting: Pharmacist

## 2011-01-21 LAB — DIFFERENTIAL
Lymphocytes Relative: 6 % — ABNORMAL LOW (ref 12–46)
Lymphs Abs: 0.7 10*3/uL (ref 0.7–4.0)
Monocytes Relative: 6 % (ref 3–12)
Neutro Abs: 11.6 10*3/uL — ABNORMAL HIGH (ref 1.7–7.7)
Neutrophils Relative %: 88 % — ABNORMAL HIGH (ref 43–77)

## 2011-01-21 LAB — PROTIME-INR
INR: 1 (ref 0.00–1.49)
INR: 1 (ref 0.00–1.49)
Prothrombin Time: 12.8 seconds (ref 11.6–15.2)

## 2011-01-21 LAB — CULTURE, BLOOD (ROUTINE X 2)
Culture: NO GROWTH
Culture: NO GROWTH

## 2011-01-21 LAB — CBC
HCT: 43.4 % (ref 39.0–52.0)
HCT: 46.5 % (ref 39.0–52.0)
Hemoglobin: 14.3 g/dL (ref 13.0–17.0)
Hemoglobin: 15.3 g/dL (ref 13.0–17.0)
Hemoglobin: 15.9 g/dL (ref 13.0–17.0)
MCHC: 32.4 g/dL (ref 30.0–36.0)
MCHC: 32.9 g/dL (ref 30.0–36.0)
MCHC: 33 g/dL (ref 30.0–36.0)
MCV: 89.4 fL (ref 78.0–100.0)
MCV: 89.8 fL (ref 78.0–100.0)
RBC: 4.85 MIL/uL (ref 4.22–5.81)
RBC: 5.26 MIL/uL (ref 4.22–5.81)
RBC: 5.46 MIL/uL (ref 4.22–5.81)
RDW: 14.9 % (ref 11.5–15.5)
RDW: 15.1 % (ref 11.5–15.5)

## 2011-01-21 LAB — BASIC METABOLIC PANEL
CO2: 22 mEq/L (ref 19–32)
Chloride: 110 mEq/L (ref 96–112)
GFR calc Af Amer: >60 mL/min (ref >60)
Glucose, Bld: 100 mg/dL — ABNORMAL HIGH (ref 70–99)
Potassium: 4 mEq/L (ref 3.5–5.1)
Sodium: 140 mEq/L (ref 135–145)

## 2011-01-21 LAB — COMPREHENSIVE METABOLIC PANEL
ALT: 34 U/L (ref 0–53)
AST: 39 U/L — ABNORMAL HIGH (ref 0–37)
CO2: 23 mEq/L (ref 19–32)
CO2: 28 mEq/L (ref 19–32)
Calcium: 9.7 mg/dL (ref 8.4–10.5)
Chloride: 106 mEq/L (ref 96–112)
Creatinine, Ser: 1.09 mg/dL (ref 0.4–1.5)
Creatinine, Ser: 1.12 mg/dL (ref 0.4–1.5)
GFR calc Af Amer: >60 mL/min (ref >60)
GFR calc Af Amer: >60 mL/min (ref >60)
GFR calc non Af Amer: >60 mL/min (ref >60)
GFR calc non Af Amer: >60 mL/min (ref >60)
Glucose, Bld: 91 mg/dL (ref 70–99)
Total Bilirubin: 0.6 mg/dL (ref 0.3–1.2)
Total Protein: 9.3 g/dL — ABNORMAL HIGH (ref 6.0–8.3)

## 2011-01-21 LAB — URINE MICROSCOPIC-ADD ON

## 2011-01-21 LAB — URINE CULTURE

## 2011-01-21 LAB — URINALYSIS, ROUTINE W REFLEX MICROSCOPIC
Bilirubin Urine: NEGATIVE
Glucose, UA: NEGATIVE mg/dL
Ketones, ur: NEGATIVE mg/dL
pH: 7.5 (ref 5.0–8.0)

## 2011-01-21 LAB — POCT I-STAT, CHEM 8
Calcium, Ion: 1.18 mmol/L (ref 1.12–1.32)
Creatinine, Ser: 1.1 mg/dL (ref 0.4–1.5)
Glucose, Bld: 91 mg/dL (ref 70–99)
Hemoglobin: 17.3 g/dL — ABNORMAL HIGH (ref 13.0–17.0)
Potassium: 4.2 mEq/L (ref 3.5–5.1)
TCO2: 28 mmol/L (ref 0–100)

## 2011-01-21 LAB — LIPASE, BLOOD: Lipase: 18 U/L (ref 11–59)

## 2011-01-25 LAB — CBC
HCT: 38.8 % — ABNORMAL LOW (ref 39.0–52.0)
Hemoglobin: 13.2 g/dL (ref 13.0–17.0)
Hemoglobin: 15.9 g/dL (ref 13.0–17.0)
MCHC: 34 g/dL (ref 30.0–36.0)
Platelets: 207 10*3/uL (ref 150–400)
RBC: 4.44 MIL/uL (ref 4.22–5.81)
RDW: 14.8 % (ref 11.5–15.5)
WBC: 5.3 10*3/uL (ref 4.0–10.5)

## 2011-01-25 LAB — URINALYSIS, ROUTINE W REFLEX MICROSCOPIC
Bilirubin Urine: NEGATIVE
Nitrite: POSITIVE — AB
Protein, ur: 30 mg/dL — AB
Urobilinogen, UA: 0.2 mg/dL (ref 0.0–1.0)

## 2011-01-25 LAB — BASIC METABOLIC PANEL
BUN: 11 mg/dL (ref 6–23)
CO2: 25 mEq/L (ref 19–32)
Calcium: 8.3 mg/dL — ABNORMAL LOW (ref 8.4–10.5)
Calcium: 9.3 mg/dL (ref 8.4–10.5)
GFR calc Af Amer: >60 mL/min (ref >60)
GFR calc non Af Amer: >60 mL/min (ref >60)
GFR calc non Af Amer: >60 mL/min (ref >60)
Glucose, Bld: 95 mg/dL (ref 70–99)
Potassium: 3.7 mEq/L (ref 3.5–5.1)
Sodium: 138 mEq/L (ref 135–145)
Sodium: 140 mEq/L (ref 135–145)

## 2011-01-25 LAB — URINE CULTURE: Colony Count: >=100000

## 2011-01-25 LAB — LACTIC ACID, PLASMA: Lactic Acid, Venous: 1 mmol/L (ref 0.5–2.2)

## 2011-01-25 LAB — CULTURE, BLOOD (ROUTINE X 2): Culture: NO GROWTH

## 2011-01-25 LAB — DIFFERENTIAL
Basophils Absolute: 0 10*3/uL (ref 0.0–0.1)
Basophils Relative: 0 % (ref 0–1)
Eosinophils Relative: 0 % (ref 0–5)
Monocytes Absolute: 0.6 10*3/uL (ref 0.1–1.0)
Neutro Abs: 7.6 10*3/uL (ref 1.7–7.7)

## 2011-01-25 LAB — POCT I-STAT 3, ART BLOOD GAS (G3+)
Acid-base deficit: 1 mmol/L (ref 0.0–2.0)
O2 Saturation: 98 %

## 2011-01-29 LAB — BASIC METABOLIC PANEL
BUN: 7 mg/dL (ref 6–23)
CO2: 20 mEq/L (ref 19–32)
Calcium: 8.6 mg/dL (ref 8.4–10.5)
Calcium: 9.2 mg/dL (ref 8.4–10.5)
Creatinine, Ser: 1.03 mg/dL (ref 0.4–1.5)
Creatinine, Ser: 1.12 mg/dL (ref 0.4–1.5)
GFR calc Af Amer: >60 mL/min (ref >60)
GFR calc Af Amer: >60 mL/min (ref >60)
GFR calc non Af Amer: >60 mL/min (ref >60)
GFR calc non Af Amer: >60 mL/min (ref >60)
Sodium: 137 mEq/L (ref 135–145)

## 2011-01-29 LAB — CBC
HCT: 42.7 % (ref 39.0–52.0)
HCT: 44.4 % (ref 39.0–52.0)
HCT: 46.4 % (ref 39.0–52.0)
Hemoglobin: 14.9 g/dL (ref 13.0–17.0)
Hemoglobin: 15.1 g/dL (ref 13.0–17.0)
MCHC: 33.5 g/dL (ref 30.0–36.0)
MCHC: 33.8 g/dL (ref 30.0–36.0)
MCV: 88.1 fL (ref 78.0–100.0)
MCV: 90 fL (ref 78.0–100.0)
MCV: 90.1 fL (ref 78.0–100.0)
Platelets: 139 10*3/uL — ABNORMAL LOW (ref 150–400)
Platelets: 153 10*3/uL (ref 150–400)
Platelets: 161 10*3/uL (ref 150–400)
RBC: 4.99 MIL/uL (ref 4.22–5.81)
RBC: 5.04 MIL/uL (ref 4.22–5.81)
RDW: 14.7 % (ref 11.5–15.5)
RDW: 15.1 % (ref 11.5–15.5)
WBC: 14 10*3/uL — ABNORMAL HIGH (ref 4.0–10.5)
WBC: 14.5 10*3/uL — ABNORMAL HIGH (ref 4.0–10.5)
WBC: 6.8 10*3/uL (ref 4.0–10.5)

## 2011-01-29 LAB — URINALYSIS, ROUTINE W REFLEX MICROSCOPIC
Bilirubin Urine: NEGATIVE
Ketones, ur: 40 mg/dL — AB
Nitrite: POSITIVE — AB
Protein, ur: 30 mg/dL — AB
Specific Gravity, Urine: 1.023 (ref 1.005–1.030)
pH: 7.5 (ref 5.0–8.0)

## 2011-01-29 LAB — CULTURE, BLOOD (ROUTINE X 2)

## 2011-01-29 LAB — COMPREHENSIVE METABOLIC PANEL
AST: 23 U/L (ref 0–37)
BUN: 9 mg/dL (ref 6–23)
CO2: 24 mEq/L (ref 19–32)
Calcium: 9 mg/dL (ref 8.4–10.5)
Chloride: 107 mEq/L (ref 96–112)
Creatinine, Ser: 0.99 mg/dL (ref 0.4–1.5)
GFR calc non Af Amer: >60 mL/min (ref >60)
Glucose, Bld: 91 mg/dL (ref 70–99)
Total Bilirubin: 1.2 mg/dL (ref 0.3–1.2)

## 2011-01-29 LAB — DIFFERENTIAL
Basophils Absolute: 0 10*3/uL (ref 0.0–0.1)
Basophils Absolute: 0 10*3/uL (ref 0.0–0.1)
Basophils Relative: 0 % (ref 0–1)
Eosinophils Absolute: 0 10*3/uL (ref 0.0–0.7)
Eosinophils Relative: 0 % (ref 0–5)
Eosinophils Relative: 1 % (ref 0–5)
Lymphocytes Relative: 8 % — ABNORMAL LOW (ref 12–46)
Lymphs Abs: 1.1 10*3/uL (ref 0.7–4.0)
Neutro Abs: 11.5 10*3/uL — ABNORMAL HIGH (ref 1.7–7.7)
Neutrophils Relative %: 76 % (ref 43–77)

## 2011-01-29 LAB — URINE CULTURE: Colony Count: >=100000

## 2011-01-29 LAB — POCT I-STAT, CHEM 8
BUN: 12 mg/dL (ref 6–23)
Chloride: 103 mEq/L (ref 96–112)
Creatinine, Ser: 1 mg/dL (ref 0.4–1.5)
Potassium: 3.6 mEq/L (ref 3.5–5.1)
Sodium: 140 mEq/L (ref 135–145)
TCO2: 24 mmol/L (ref 0–100)

## 2011-01-29 LAB — VANCOMYCIN, TROUGH
Vancomycin Tr: 10.3 ug/mL (ref 10.0–20.0)
Vancomycin Tr: 18.4 ug/mL (ref 10.0–20.0)

## 2011-01-29 LAB — MAGNESIUM: Magnesium: 1.9 mg/dL (ref 1.5–2.5)

## 2011-01-29 LAB — CLOSTRIDIUM DIFFICILE EIA: C difficile Toxins A+B, EIA: NEGATIVE

## 2011-01-30 LAB — BASIC METABOLIC PANEL
BUN: 13 mg/dL (ref 6–23)
BUN: 6 mg/dL (ref 6–23)
BUN: 6 mg/dL (ref 6–23)
CO2: 22 mEq/L (ref 19–32)
CO2: 22 mEq/L (ref 19–32)
Calcium: 8.6 mg/dL (ref 8.4–10.5)
Chloride: 108 mEq/L (ref 96–112)
Chloride: 108 mEq/L (ref 96–112)
Creatinine, Ser: 0.81 mg/dL (ref 0.4–1.5)
GFR calc non Af Amer: >60 mL/min (ref >60)
GFR calc non Af Amer: >60 mL/min (ref >60)
Glucose, Bld: 105 mg/dL — ABNORMAL HIGH (ref 70–99)
Glucose, Bld: 86 mg/dL (ref 70–99)
Glucose, Bld: 95 mg/dL (ref 70–99)
Potassium: 3.8 mEq/L (ref 3.5–5.1)
Potassium: 4.1 mEq/L (ref 3.5–5.1)
Sodium: 137 mEq/L (ref 135–145)

## 2011-01-30 LAB — CBC
HCT: 39.9 % (ref 39.0–52.0)
HCT: 46 % (ref 39.0–52.0)
Hemoglobin: 13.3 g/dL (ref 13.0–17.0)
MCHC: 32.7 g/dL (ref 30.0–36.0)
MCHC: 34 g/dL (ref 30.0–36.0)
MCV: 87.6 fL (ref 78.0–100.0)
MCV: 89 fL (ref 78.0–100.0)
MCV: 89 fL (ref 78.0–100.0)
Platelets: 116 10*3/uL — ABNORMAL LOW (ref 150–400)
Platelets: 131 10*3/uL — ABNORMAL LOW (ref 150–400)
Platelets: 139 10*3/uL — ABNORMAL LOW (ref 150–400)
Platelets: 150 10*3/uL (ref 150–400)
RDW: 14 % (ref 11.5–15.5)
RDW: 14.6 % (ref 11.5–15.5)
RDW: 14.7 % (ref 11.5–15.5)
WBC: 12.6 10*3/uL — ABNORMAL HIGH (ref 4.0–10.5)
WBC: 14.8 10*3/uL — ABNORMAL HIGH (ref 4.0–10.5)

## 2011-01-30 LAB — DIFFERENTIAL
Basophils Absolute: 0 10*3/uL (ref 0.0–0.1)
Basophils Absolute: 0.1 10*3/uL (ref 0.0–0.1)
Basophils Relative: 0 % (ref 0–1)
Basophils Relative: 0 % (ref 0–1)
Eosinophils Absolute: 0 10*3/uL (ref 0.0–0.7)
Eosinophils Absolute: 0.1 10*3/uL (ref 0.0–0.7)
Eosinophils Absolute: 0.2 10*3/uL (ref 0.0–0.7)
Eosinophils Relative: 0 % (ref 0–5)
Eosinophils Relative: 1 % (ref 0–5)
Lymphocytes Relative: 15 % (ref 12–46)
Lymphocytes Relative: 9 % — ABNORMAL LOW (ref 12–46)
Lymphs Abs: 1.9 10*3/uL (ref 0.7–4.0)
Monocytes Absolute: 1.1 10*3/uL — ABNORMAL HIGH (ref 0.1–1.0)
Monocytes Absolute: 1.1 10*3/uL — ABNORMAL HIGH (ref 0.1–1.0)
Monocytes Relative: 8 % (ref 3–12)
Neutro Abs: 9.7 10*3/uL — ABNORMAL HIGH (ref 1.7–7.7)
Neutrophils Relative %: 76 % (ref 43–77)

## 2011-01-30 LAB — COMPREHENSIVE METABOLIC PANEL
AST: 20 U/L (ref 0–37)
Albumin: 3.1 g/dL — ABNORMAL LOW (ref 3.5–5.2)
Alkaline Phosphatase: 63 U/L (ref 39–117)
Chloride: 109 mEq/L (ref 96–112)
GFR calc Af Amer: >60 mL/min (ref >60)
Potassium: 3.9 mEq/L (ref 3.5–5.1)
Sodium: 138 mEq/L (ref 135–145)
Total Bilirubin: 0.7 mg/dL (ref 0.3–1.2)
Total Protein: 6.7 g/dL (ref 6.0–8.3)

## 2011-01-30 LAB — URINALYSIS, ROUTINE W REFLEX MICROSCOPIC
Bilirubin Urine: NEGATIVE
Protein, ur: 30 mg/dL — AB
Specific Gravity, Urine: 1.023 (ref 1.005–1.030)
Urobilinogen, UA: 0.2 mg/dL (ref 0.0–1.0)

## 2011-01-30 LAB — URINE CULTURE: Special Requests: NEGATIVE

## 2011-01-30 LAB — URINE MICROSCOPIC-ADD ON

## 2011-01-30 LAB — PROTIME-INR: INR: 1.1 (ref 0.00–1.49)

## 2011-01-30 LAB — APTT: aPTT: 35 seconds (ref 24–37)

## 2011-02-01 ENCOUNTER — Non-Acute Institutional Stay: Payer: Self-pay | Admitting: Family Medicine

## 2011-02-01 DIAGNOSIS — I10 Essential (primary) hypertension: Secondary | ICD-10-CM

## 2011-02-01 DIAGNOSIS — G35 Multiple sclerosis: Secondary | ICD-10-CM

## 2011-02-01 DIAGNOSIS — R634 Abnormal weight loss: Secondary | ICD-10-CM

## 2011-02-04 DIAGNOSIS — R634 Abnormal weight loss: Secondary | ICD-10-CM | POA: Insufficient documentation

## 2011-02-04 NOTE — Assessment & Plan Note (Signed)
Functionally quadriparetic. Risks of pneumonia

## 2011-02-04 NOTE — Progress Notes (Unsigned)
  Subjective:    Patient ID: [name], male    DOB: 12-03-1950, 60 y.o.   MRN: [id number]  [name] was seen 4/18 at [facility] on routine follow up. He denies bladder spasms or other pain. No back pain. Also denies dysphagia.     Review of Systems     Objective:   Physical Exam  Constitutional:       Generalized muscle atrophy.  Pleasant, cooperative  HENT:  Head: Normocephalic.  Cardiovascular: Normal rate and regular rhythm.   Pulmonary/Chest: Effort normal and breath sounds normal.  Abdominal: Soft. He exhibits no distension. There is no tenderness.       Suprapubic cystotomy catheter.   Neurological: He is alert.       Flexion contractures of arms, extension contractures of legs. No hyperreflexia or clonus. Positive signs of intranuclear opthalmoplegia.           Assessment & Plan:

## 2011-02-04 NOTE — Assessment & Plan Note (Signed)
Slow weight loss of 5.2 lbs Jan - March, then gain of 13.2 lbs. No edema, so believe this is an error and asked that the weight be rechecked.

## 2011-02-04 NOTE — Assessment & Plan Note (Signed)
well controlled  

## 2011-02-10 ENCOUNTER — Telehealth: Payer: Self-pay | Admitting: Family Medicine

## 2011-02-10 ENCOUNTER — Inpatient Hospital Stay (HOSPITAL_COMMUNITY)
Admission: EM | Admit: 2011-02-10 | Discharge: 2011-02-15 | DRG: 872 | Disposition: A | Discharge status: Skilled Nursing Facility | Payer: Medicare Other | Attending: Internal Medicine | Admitting: Internal Medicine

## 2011-02-10 ENCOUNTER — Emergency Department (HOSPITAL_COMMUNITY): Payer: Medicare Other

## 2011-02-10 DIAGNOSIS — A419 Sepsis, unspecified organism: Secondary | ICD-10-CM | POA: Diagnosis present

## 2011-02-10 DIAGNOSIS — Z993 Dependence on wheelchair: Secondary | ICD-10-CM

## 2011-02-10 DIAGNOSIS — G35 Multiple sclerosis: Secondary | ICD-10-CM | POA: Diagnosis present

## 2011-02-10 DIAGNOSIS — L8991 Pressure ulcer of unspecified site, stage 1: Secondary | ICD-10-CM | POA: Diagnosis present

## 2011-02-10 DIAGNOSIS — L89309 Pressure ulcer of unspecified buttock, unspecified stage: Secondary | ICD-10-CM | POA: Diagnosis present

## 2011-02-10 DIAGNOSIS — R310 Gross hematuria: Secondary | ICD-10-CM | POA: Diagnosis present

## 2011-02-10 DIAGNOSIS — R1310 Dysphagia, unspecified: Secondary | ICD-10-CM | POA: Diagnosis present

## 2011-02-10 DIAGNOSIS — S3720XA Unspecified injury of bladder, initial encounter: Secondary | ICD-10-CM | POA: Diagnosis present

## 2011-02-10 DIAGNOSIS — I10 Essential (primary) hypertension: Secondary | ICD-10-CM | POA: Diagnosis present

## 2011-02-10 DIAGNOSIS — X58XXXA Exposure to other specified factors, initial encounter: Secondary | ICD-10-CM | POA: Diagnosis present

## 2011-02-10 DIAGNOSIS — N390 Urinary tract infection, site not specified: Secondary | ICD-10-CM | POA: Diagnosis present

## 2011-02-10 DIAGNOSIS — Z681 Body mass index (BMI) 19 or less, adult: Secondary | ICD-10-CM

## 2011-02-10 DIAGNOSIS — Z87440 Personal history of urinary (tract) infections: Secondary | ICD-10-CM

## 2011-02-10 DIAGNOSIS — A4102 Sepsis due to Methicillin resistant Staphylococcus aureus: Principal | ICD-10-CM | POA: Diagnosis present

## 2011-02-10 DIAGNOSIS — N319 Neuromuscular dysfunction of bladder, unspecified: Secondary | ICD-10-CM | POA: Diagnosis present

## 2011-02-10 DIAGNOSIS — S3730XA Unspecified injury of urethra, initial encounter: Secondary | ICD-10-CM | POA: Diagnosis present

## 2011-02-10 DIAGNOSIS — Z7901 Long term (current) use of anticoagulants: Secondary | ICD-10-CM

## 2011-02-10 DIAGNOSIS — Z66 Do not resuscitate: Secondary | ICD-10-CM | POA: Diagnosis present

## 2011-02-10 DIAGNOSIS — E860 Dehydration: Secondary | ICD-10-CM | POA: Diagnosis present

## 2011-02-10 DIAGNOSIS — L89109 Pressure ulcer of unspecified part of back, unspecified stage: Secondary | ICD-10-CM | POA: Diagnosis present

## 2011-02-10 DIAGNOSIS — Z86711 Personal history of pulmonary embolism: Secondary | ICD-10-CM

## 2011-02-10 DIAGNOSIS — D62 Acute posthemorrhagic anemia: Secondary | ICD-10-CM | POA: Diagnosis present

## 2011-02-10 LAB — CBC
HCT: 46.5 % (ref 39.0–52.0)
Hemoglobin: 15.5 g/dL (ref 13.0–17.0)
MCH: 28.7 pg (ref 26.0–34.0)
MCHC: 33.3 g/dL (ref 30.0–36.0)
MCV: 86 fL (ref 78.0–100.0)
Platelets: 215 K/uL (ref 150–400)
RBC: 5.41 MIL/uL (ref 4.22–5.81)
RDW: 15.5 % (ref 11.5–15.5)
WBC: 20.7 K/uL — ABNORMAL HIGH (ref 4.0–10.5)

## 2011-02-10 LAB — BLOOD GAS, VENOUS
Acid-base deficit: 2.1 mmol/L — ABNORMAL HIGH (ref 0.0–2.0)
Bicarbonate: 23.3 meq/L (ref 20.0–24.0)
FIO2: 1 %
O2 Saturation: 71.3 %
Patient temperature: 98.6
TCO2: 21.7 mmol/L (ref 0–100)
pCO2, Ven: 45.2 mmHg (ref 45.0–50.0)
pH, Ven: 7.332 — ABNORMAL HIGH (ref 7.250–7.300)
pO2, Ven: 41.6 mmHg (ref 30.0–45.0)

## 2011-02-10 LAB — URINALYSIS, ROUTINE W REFLEX MICROSCOPIC
Bilirubin Urine: NEGATIVE
Glucose, UA: NEGATIVE mg/dL
Nitrite: NEGATIVE
Protein, ur: >300 mg/dL — AB
Specific Gravity, Urine: 1.027 (ref 1.005–1.030)
Urobilinogen, UA: 1 mg/dL (ref 0.0–1.0)
pH: 5.5 (ref 5.0–8.0)

## 2011-02-10 LAB — POCT I-STAT, CHEM 8
BUN: 16 mg/dL (ref 6–23)
Calcium, Ion: 1.1 mmol/L — ABNORMAL LOW (ref 1.12–1.32)
Chloride: 107 mEq/L (ref 96–112)
Creatinine, Ser: 1.2 mg/dL (ref 0.4–1.5)
Glucose, Bld: 111 mg/dL — ABNORMAL HIGH (ref 70–99)
HCT: 47 % (ref 39.0–52.0)
Hemoglobin: 16 g/dL (ref 13.0–17.0)
Potassium: 4 mEq/L (ref 3.5–5.1)
Sodium: 141 mEq/L (ref 135–145)
TCO2: 24 mmol/L (ref 0–100)

## 2011-02-10 LAB — DIFFERENTIAL
Basophils Absolute: 0 10*3/uL (ref 0.0–0.1)
Basophils Relative: 0 % (ref 0–1)
Eosinophils Absolute: 0.1 K/uL (ref 0.0–0.7)
Eosinophils Relative: 0 % (ref 0–5)
Lymphocytes Relative: 10 % — ABNORMAL LOW (ref 12–46)
Lymphs Abs: 2.1 K/uL (ref 0.7–4.0)
Monocytes Absolute: 1.9 K/uL — ABNORMAL HIGH (ref 0.1–1.0)
Monocytes Relative: 9 % (ref 3–12)
Neutro Abs: 16.6 10*3/uL — ABNORMAL HIGH (ref 1.7–7.7)
Neutrophils Relative %: 80 % — ABNORMAL HIGH (ref 43–77)

## 2011-02-10 LAB — URINE MICROSCOPIC-ADD ON

## 2011-02-10 LAB — PROCALCITONIN: Procalcitonin: 0.16 ng/mL

## 2011-02-10 LAB — PROTIME-INR
INR: 2.21 — ABNORMAL HIGH (ref 0.00–1.49)
Prothrombin Time: 24.7 seconds — ABNORMAL HIGH (ref 11.6–15.2)

## 2011-02-10 LAB — GLUCOSE, CAPILLARY: Glucose-Capillary: 156 mg/dL — ABNORMAL HIGH (ref 70–99)

## 2011-02-10 LAB — SAMPLE TO BLOOD BANK

## 2011-02-10 LAB — LACTIC ACID, PLASMA: Lactic Acid, Venous: 3.6 mmol/L — ABNORMAL HIGH (ref 0.5–2.2)

## 2011-02-10 LAB — APTT: aPTT: 42 seconds — ABNORMAL HIGH (ref 24–37)

## 2011-02-10 NOTE — Telephone Encounter (Signed)
Call from [facility], pt suprapubic catheter was not draining. This was changed by the nursing cath and that one did not drain. Pt then began to have a large amount of bleeding from the penis. He is on coumadin therapy. He will be transferred to the ED for evaluation.

## 2011-02-11 LAB — CBC
MCV: 86.9 fL (ref 78.0–100.0)
Platelets: 146 10*3/uL — ABNORMAL LOW (ref 150–400)
RDW: 15.7 % — ABNORMAL HIGH (ref 11.5–15.5)
WBC: 13.2 10*3/uL — ABNORMAL HIGH (ref 4.0–10.5)

## 2011-02-11 LAB — DIFFERENTIAL
Basophils Absolute: 0 10*3/uL (ref 0.0–0.1)
Eosinophils Absolute: 0 10*3/uL (ref 0.0–0.7)
Eosinophils Relative: 0 % (ref 0–5)
Lymphs Abs: 2.1 10*3/uL (ref 0.7–4.0)

## 2011-02-11 LAB — BASIC METABOLIC PANEL
BUN: 13 mg/dL (ref 6–23)
GFR calc non Af Amer: >60 mL/min (ref >60)
Potassium: 4.3 mEq/L (ref 3.5–5.1)

## 2011-02-11 LAB — MRSA PCR SCREENING: MRSA by PCR: NEGATIVE

## 2011-02-12 ENCOUNTER — Inpatient Hospital Stay (HOSPITAL_COMMUNITY): Payer: Medicare Other

## 2011-02-12 LAB — CBC
HCT: 24.9 % — ABNORMAL LOW (ref 39.0–52.0)
Platelets: 101 10*3/uL — ABNORMAL LOW (ref 150–400)
RDW: 15.8 % — ABNORMAL HIGH (ref 11.5–15.5)
WBC: 7.6 10*3/uL (ref 4.0–10.5)

## 2011-02-12 LAB — URINE CULTURE
Colony Count: 3000
Culture  Setup Time: 201204290258

## 2011-02-12 LAB — BASIC METABOLIC PANEL
GFR calc Af Amer: >60 mL/min (ref >60)
GFR calc non Af Amer: >60 mL/min (ref >60)
Glucose, Bld: 100 mg/dL — ABNORMAL HIGH (ref 70–99)
Potassium: 3.7 mEq/L (ref 3.5–5.1)
Sodium: 139 mEq/L (ref 135–145)

## 2011-02-12 LAB — PROTIME-INR
INR: 1.84 — ABNORMAL HIGH (ref 0.00–1.49)
Prothrombin Time: 21.4 seconds — ABNORMAL HIGH (ref 11.6–15.2)

## 2011-02-13 LAB — CBC
Hemoglobin: 10.9 g/dL — ABNORMAL LOW (ref 13.0–17.0)
MCHC: 33.1 g/dL (ref 30.0–36.0)
Platelets: 105 10*3/uL — ABNORMAL LOW (ref 150–400)
RBC: 3.85 MIL/uL — ABNORMAL LOW (ref 4.22–5.81)

## 2011-02-13 LAB — CROSSMATCH
Antibody Screen: NEGATIVE
Unit division: 0

## 2011-02-13 LAB — PROTIME-INR
INR: 1.2 (ref 0.00–1.49)
Prothrombin Time: 15.4 seconds — ABNORMAL HIGH (ref 11.6–15.2)

## 2011-02-13 NOTE — Telephone Encounter (Signed)
Patient was sent to the ER and admitted on the hospitalist service

## 2011-02-14 LAB — CULTURE, BLOOD (ROUTINE X 2): Culture  Setup Time: 201204290208

## 2011-02-14 LAB — CBC
MCH: 28.6 pg (ref 26.0–34.0)
MCV: 86.5 fL (ref 78.0–100.0)
Platelets: 129 10*3/uL — ABNORMAL LOW (ref 150–400)
RBC: 4.06 MIL/uL — ABNORMAL LOW (ref 4.22–5.81)
RDW: 15.1 % (ref 11.5–15.5)

## 2011-02-14 LAB — BASIC METABOLIC PANEL
BUN: 8 mg/dL (ref 6–23)
Calcium: 9.1 mg/dL (ref 8.4–10.5)
Chloride: 108 mEq/L (ref 96–112)
Creatinine, Ser: 0.85 mg/dL (ref 0.4–1.5)
GFR calc Af Amer: >60 mL/min (ref >60)
GFR calc non Af Amer: >60 mL/min (ref >60)

## 2011-02-15 ENCOUNTER — Inpatient Hospital Stay (HOSPITAL_COMMUNITY): Payer: Medicare Other

## 2011-02-15 MED ORDER — IOHEXOL 300 MG/ML  SOLN
50.0000 mL | Freq: Once | INTRAMUSCULAR | Status: AC | PRN
  Administered 2011-02-15: 10 mL via INTRAVENOUS

## 2011-02-16 NOTE — Consult Note (Signed)
[name], [name]              ACCOUNT NO.:  [id number]  MEDICAL RECORD NO.:  [id number]           PATIENT TYPE:  I  LOCATION:  1310                         FACILITY:  [facility]  PHYSICIAN:  Danae Chen, M.D.  DATE OF BIRTH:  September 18, 1951  DATE OF CONSULTATION:  02/11/2011 DATE OF DISCHARGE:                                CONSULTATION   REASON FOR CONSULTATION:  Urethral bleeding.  HISTORY OF PRESENT ILLNESS:  The patient is a 60 year old male who has a history of multiple sclerosis.  He has an SP tube that has been in place for a while.  The SP tube has been replaced at the nursing home every 4 weeks.  Apparently the SP tube came out yesterday morning and the nurses at nursing facility attempted to place a Foley catheter through the penis.  He has been on Coumadin for pulmonary embolism.  He then started having bleeding from the urethra.  He was then brought to the emergency room.  The suprapubic catheter was replaced and he had continued to have bleeding from the urethra.  Then he became hypotensive and tachycardic. He was then admitted for further treatment and I was asked to see him in consultation.  PAST MEDICAL HISTORY:  He has end-stage multiple sclerosis and he has had an SP tube for a while and the SP tube is replaced every 4 weeks. The SP tube was placed for neurogenic bladder.  He has a history of pulmonary embolism.  He has chronic urinary tract infections.  There is also a history of hypertension.  MEDICATIONS:  Metoprolol, Toviaz, Coumadin, Colace, Dulcolax, calcium and vitamin D.  ALLERGIES:  No known drug allergies.  SOCIAL HISTORY:  He resides at a skilled nursing facility [facility].  He does not smoke nor drink.  FAMILY HISTORY:  It was unable to obtain his family history from him.  REVIEW OF SYSTEMS:  As noted in the HPI and everything else is negative.  PHYSICAL EXAMINATION:  GENERAL:  This is a pleasant 60 year old male who is not  complaining of any pain at this time. VITAL SIGNS:  His temperature is 98.5, pulse 97, respirations 22, and blood pressure 121/63. SKIN:  Warm and dry. ABDOMEN:  His abdomen is soft, nondistended, nontender.  He has no CVA tenderness. GU:  Kidneys are not palpable.  He has no hepatomegaly, no splenomegaly. He has an SP tube that is draining well at this time.  He has minimal bleeding at this time from the urethra and scrotum is normal.  He has no testicular mass.  Cords and epididymis are within normal limits. RECTAL:  Examination is deferred.  IMPRESSION:  Urethral bleeding secondary to traumatic Foley catheter insertion, neurogenic bladder, end-stage multiple sclerosis, suprapubic cystostomy, and chronic urinary tract infection.  SUGGESTIONS:  Leave SP tube in place.  Ice packs to the penis and scrotum.  Would not attempt at this time to reinsert a Foley catheter in the bladder.  Continue IV antibiotics.  We will follow the patient with you.     Danae Chen, M.D.     MN/MEDQ  D:  02/11/2011  T:  02/11/2011  Job:  [id number]  Electronically Signed by Lindaann Slough M.D. on 02/16/2011 03:19:22 PM

## 2011-02-17 LAB — CULTURE, BLOOD (ROUTINE X 2)
Culture  Setup Time: 201204290208
Culture: NO GROWTH

## 2011-02-21 NOTE — Discharge Summary (Signed)
[name], [name]              ACCOUNT NO.:  [id number]  MEDICAL RECORD NO.:  [id number]           PATIENT TYPE:  I  LOCATION:  1310                         FACILITY:  [facility]  PHYSICIAN:  Altha Harm, MDDATE OF BIRTH:  [birth year]  DATE OF ADMISSION:  02/10/2011 DATE OF DISCHARGE:  02/15/2011                              DISCHARGE SUMMARY   DISCHARGE DISPOSITION:  Skilled nursing facility.  FINAL DISCHARGE DIAGNOSES: 1. Urinary tract infection. 2. MRSA bacteremia. 3. Acute dehydration, resolved. 4. Hematuria, resolved. 5. Multiple sclerosis. 6. History of pulmonary embolus, greater than 6 months ago. 7. Chronic indwelling suprapubic catheter. 8. Hypertension, well controlled. 9. Neurogenic bladder secondary to multiple sclerosis. 10.Dysphagia.  DISCHARGE MEDICATIONS:  Includes the following. 1. Ensure 1 can p.o. b.i.d. between meals. 2. Multivitamin with minerals 1 tab p.o. daily. 3. Vancomycin 1200 mg IV b.i.d. for 12 days to be given until Feb 27, 2011. 4. Calcium carbonate with vitamin D over-the-counter 1 tab p.o. daily. 5. Metoprolol 12.5 mg p.o. b.i.d. Discontinued medications:  Coumadin 6 mg p.o. daily.  CONSULTANTS:  Lindaann Slough, M.D., Urology.  PROCEDURES:  None.  DIAGNOSTIC STUDIES: 1. Portable chest x-ray on admission which shows no acute     cardiopulmonary disease. 2. Renal ultrasound on the 30th which shows normal study.  PRIMARY CARE PHYSICIAN:  [employer].  CODE STATUS:  Do not resuscitate.  ALLERGIES:  No known drug allergies.  CHIEF COMPLAINT:  Bloody urine.  HISTORY OF PRESENT ILLNESS:  Please refer to the H and P by Dr. [name] for details of the HPI.  However, in short, this is a 60 year old gentleman with multiple sclerosis who was brought to the emergency room after having gross hematuria via his suprapubic catheter.  The patient has been on Coumadin therapy for pulmonary embolus, diagnosed in July  of 2011.  The patient was initially hemodynamically stable in the emergency room and then became hypotensive and tachycardic.  He was found to have a lactate level elevated at 3.6 and procalcitonin level at 0.16.  [facility] were consulted for further evaluation and management.  1. MRSA bacteremia with sepsis.  The patient presented with a clinical     picture of sepsis.  The patient therefore was started on broad-     spectrum antibiotics and both urine and blood cultures were sent     for evaluation.  Although, the patient's urine culture did not grew     out pathogens it is still felt that this patient did have urinary     tract infection which likely led to this MRSA bacteremia leading to     sepsis.  The MRSA was found to be sensitive to vancomycin.  I had a     phone consultation with ID who recommends that the patient had at     least 7 days of IV vancomycin before being transitioned over to any     oral medications.  The MRSA was found to be sensitive to several     medications including Bactrim and tetracycline.  However, at this     time,  my recommendation is that the patient be discharged on     vancomycin.  I am making recommendation for vancomycin through the     entire course of his 14-day therapy, however, it would not be     unreasonable for the patient to have 7 days of vancomycin and then     switch to Bactrim to complete his course of therapy for 10 days.  I     will defer to the treating physician at nursing facility to make     determination after 7 days of therapy based on the patient's     clinical condition and any factors affecting the administration of     medications.  The patient's septic picture is completely resolved.     He is now hemodynamically stable.  He thus has been able to be     restarted on his usual medications.  It is noted that he is being     discharged on vancomycin. 2. Gross hematuria.  It is unclear as to whether or not the  hematuria     is associated with the urinary tract infection or whether this was     secondary to trauma.  Nevertheless, the patient had a decrease in     his hemoglobin down to 8.1 which required a transfusion of 3 units     of packed red blood cells.  Because of the ongoing hematuria, the     patient was reversed on his INR and the Coumadin was discontinued.     There was a lot of discussion around whether or not this patient     should be restarted on Coumadin due to the fact that he did have a     pulmonary embolus.  I will review all the records that are     available.  I spoke with the personnel at [facility] and     were unable to find any evidence of any predisposing factors that     would require this gentleman to be on lifelong Coumadin.  The     patient has been treated for greater than 6 months, although it is     not quite yet a year.  I have spoken with the hematologist who     recommends that the patient can be discontinued from the Coumadin     and that the pulmonary embolus would have been adequately treated.     Thus at this time, weighing risks and benefits, we have     discontinued the Coumadin and the patient has had no further     hematuria and his hemoglobin has remained stable.  At the time of     discharge, the patient has a hemoglobin of 11.6, hematocrit of 35.1     and platelet count of 129, white blood cell count 6.5. 3. In terms of the patient's skin, the patient has stage I area on his     backside which was present on admission.  There has been no further     breakdown here and his skin appears to be relatively well cared     for.  The patient does have contractures which predisposed him to     excessive pressure on bony prominences, otherwise the patient is     stable.  PHYSICAL EXAMINATION:  At the time of discharge, his physical examination is as such: GENERAL:  The patient is chronically ill but acutely well appearing. VITAL SIGNS:   Temperature is  98.9, heart rate 81, blood pressure 149/86, respiratory rate 18, O2 sats are 100% on room air. HEENT:  He is normocephalic, atraumatic.  Pupils are equally round and reactive to light and accommodation.  Extraocular movements are intact. Oropharynx is moist.  No exudate, erythema or lesions are noted. NECK:  Trachea is midline.  No masses, no thyromegaly.  No JVD.  No carotid bruits. RESPIRATORY:  The patient has normal respiratory effort.  Equal excursion bilaterally. CARDIOVASCULAR:  He has got a normal S1 and S2.  No murmurs, rubs or gallops are noted.  PMI is nondisplaced.  No heaves or thrills on palpation. ABDOMEN:  Obese, soft, nontender and nondistended.  No masses.  No hepatosplenomegaly.  DIETARY RESTRICTIONS:  None.  PHYSICAL RESTRICTIONS:  The patient is wheelchair bound and we will treat that way.  The patient has reducible contractures and should probably receive passive range of motion on a daily basis to prevent any further contractures.  PLAN:  The plan is for the patient to be discharged to skilled facility once his PICC line has been placed.  Passively, we are awaiting PICC line placement via Interventional Radiology.  FOLLOWUP:  The patient is to be seen by the physician at the skilled facility within 72 hours.     Altha Harm, MD     MAM/MEDQ  D:  02/15/2011  T:  02/15/2011  Job:  [id number]  Electronically Signed by Marthann Schiller MD on 02/21/2011 02:20:54 PM

## 2011-02-24 NOTE — H&P (Signed)
[name], [name] NO.:  [id number]  MEDICAL RECORD NO.:  [id number]           PATIENT TYPE:  I  LOCATION:  1223                         FACILITY:  [facility]  PHYSICIAN:  [name], M.D. DATE OF BIRTH:  April 05, 1951  DATE OF ADMISSION:  02/10/2011 DATE OF DISCHARGE:                             HISTORY & PHYSICAL   PRIMARY CARE PHYSICIAN:  [facility].  CHIEF COMPLAINT:  Bloody urine.  HISTORY OF PRESENT ILLNESS:  This is a 60 year old male with a history of multiple sclerosis who resides at the [facility], was brought to the emergency department after passing gross hematuria per his suprapubic catheter and via his penis after catheters had been attempted to be placed.  The patient is on Coumadin therapy for a history of pulmonary embolism.  Per report of the staff, the patient had passed large clots.  The patient was seen and evaluated in the emergency department, he was found to have a urinary tract infection, his hemoglobin level on admission was found to be 15.5.  In the emergency department, the patient began to have hypotension and tachycardia.  Laboratory studies were also performed to evaluate for sepsis and these returned strongly positive.  A lactate level of 3.6 with a procalcitonin level of 0.16.  The patient was started on the sepsis protocol and referred for medical admission.  PAST MEDICAL HISTORY: 1. Multiple sclerosis. 2. Neurogenic bladder. 3. Status post suprapubic catheter placement. 4. Pulmonary embolism, on chronic Coumadin therapy. 5. Recurrent urinary tract infections and hospitalization in December     2011 with sepsis and urinary tract infection, the cultures grew     Providencia stuartii, which was resistant to multiple medications. 6. The patient also has chronic sacral decubitus ulcerations, which     are unstageable. 7. Dysphagia. 8. Hypertension.  MEDICATIONS:  At this time include  metoprolol, Toviaz, Coumadin, Colace, milk of magnesia, Dulcolax, Fleet enema and free water and Oyster Shell Calcium with Vitamin D.  ALLERGIES:  No known drug allergies.  SOCIAL HISTORY:  The patient resides at the skilled nursing home facility, [facility].  He is a nonsmoker, nondrinker.  No history of illicit drug usage.  FAMILY HISTORY:  Noncontributory.  REVIEW OF SYSTEMS:  Pertinent as mentioned above.  PHYSICAL EXAMINATION FINDINGS:  GENERAL:  This is a thin, sickly appearing, cachectic 60 year old African American male who is in acute distress.  VITAL SIGNS:  Temperature 98.5 to 101.7; blood pressure 138/65, but decreased 81/52; heart rate 104 to 151, respirations 20 to 32, O2 saturation 99% to 100%. HEENT:  Normocephalic, atraumatic.  The patient has exophthalmos or strabismus.  Pupils are equally round, reactive to light.  Unable to elicit appropriate extraocular movements.  Nares are patent bilaterally. Oropharynx is clear. NECK:  Supple.  No thyromegaly, adenopathy, jugular venous distention. CARDIOVASCULAR:  Tachycardic rate and rhythm.  No murmurs, gallops, or rubs appreciated. LUNGS:  Clear to auscultation bilaterally.  No rales, rhonchi, or wheezes. ABDOMEN:  Positive bowel sounds.  Soft, nontender, nondistended.  The suprapubic catheter is present.  There is sanguineous drainage around the insertion of the suprapubic catheter at  the site. GENITOURINARY:  Normal male genitalia.  The patient also sanguineous drainage from his meatus. BACK:  The patient has 2 areas of unstageable sacral decubitus ulcers, which are 1-1/2 cm in diameter on the buttocks and the coccyx area. NEUROLOGIC:  The patient has generalized weakness and muscle atrophy and contractures of both forearms and hands.  LABORATORY STUDIES:  White blood cell count 20.7, hemoglobin 15.5, hematocrit 46.5, platelets 215, neutrophils 80% lymphocytes 10%.  Sodium 141, potassium 4.0, chloride 107, CO2 of  24, BUN 16, creatinine 1.2, and glucose 111.  Prothrombin time 24.7, INR 2.21, PTT 42.  Urinalysis, large urine hemoglobin, moderate leukocyte esterase, greater than 300 protein.  Urine microscopic with white blood cells too numerous to count, many bacteria.  Chest x-ray reveals no acute cardiopulmonary disease findings.  ASSESSMENT:  A 60 year old male being admitted with 1. Sepsis and septic shock. 2. Urinary tract infection. 3. Hematuria. 4. Neurogenic bladder. 5. Coagulopathy secondary to Coumadin therapy. 6. Prehospital sacral decubitus ulcers, which are unstageable. 7. History of pulmonary emboli in the past. 8. Multiple sclerosis. 9. Contractures, which are chronic.  PLAN:  The patient has been admitted to step-down ICU area.  He has been placed on the sepsis protocol.  IV vancomycin and cefepime have been ordered in the past for these Providencia stuartii that grew in the urine cultures, was susceptible to cefepime and Rocephin.  However, it was not susceptible to ciprofloxacin in the past.  However, this will remain for now and may be changed if needed and possibly an infectious diseases consultation may be needed.  Prior to administration of antibiotics, blood cultures x2 have been ordered and urine cultures and the patient has been placed on IV fluids for fluid resuscitation secondary to his hypotension and tachycardia.  Pulmonary Critical Care has been consulted and Urology has also been consulted as well.  The consultant on-call for Urology was Dr. Brunilda Payor and he will see the patient in the a.m.  CODE STATUS:  We will remain a do not resuscitate wound care.  A wound care evaluation has also been requested for further care of the sacral decubitus ulcers.  The patient's regular medications will be further verified.  However, most of his medications will be held secondary to his hypertension at this time.  His Coumadin therapy will also be held secondary to the hematuria.   He has been placed in SCDs for DVT prophylaxis at this time and his PT and INR will be monitored.  He may be started on a low-dose heparin drip is needed once the hematuria has ceased.     [name], M.D.     HJ/MEDQ  D:  02/11/2011  T:  02/11/2011  Job:  [id number]  Electronically Signed by [name] M.D. on 02/24/2011 07:48:41 PM

## 2011-02-27 NOTE — Discharge Summary (Signed)
NAMEIRVIN, BASTIN NO.:  [id number]   MEDICAL RECORD NO.:  [id number]          PATIENT TYPE:  INP   LOCATION:  1405                         FACILITY:  [facility]   PHYSICIAN:  Ramiro Harvest, MD    DATE OF BIRTH:  05-Jun-1951   DATE OF ADMISSION:  12/01/2008  DATE OF DISCHARGE:  12/03/2008                               DISCHARGE SUMMARY   PRIMARY CARE PHYSICIAN:  Dr. Maurice Small of [facility].   DISCHARGE DIAGNOSES:  1. Pseudomonas urinary tract infection.  2. Altered mental status secondary to problem #1.  3. Mild dehydration.  4. Multiple sclerosis.  5. Stage III sacral decubitus ulcer.  6. Oropharyngeal dysphagia, aspiration risk.  The patient does not      wish to have a percutaneous endoscopic gastrostomy tube placement.  7. Multiple sclerosis, bed-bound.  8. Neurogenic bladder.  9. History of recurrent urinary tract infections.  10.History of recurrent confused state secondary to urinary tract      infections.  11.History of recurrent pneumonia.  12.History of toxic encephalopathy.  13.The patient is a do not resuscitate.   DISCHARGE MEDICATIONS:  1. Ciprofloxacin 500 mg p.o. b.i.d. x11 days.  2. Oxybutynin 5 mg p.o. daily.  3. Multivitamin 1 tablet p.o. daily.   DISPOSITION AND FOLLOWUP:  The patient will be discharged home.  The  patient will be discharged home with home health PT/OT and a registered  nurse.  The patient will need wound care with Aquacel daily to the wound  area; may use as swab with normal saline to remove old dressing.  The  patient is to follow up with PCP in 1-2 weeks and a follow-up repeat  urinalysis will need to be checked for resolution of urinary tract  infection.  The patient has had multiple admissions for urinary tract  infections causing altered mental status.  May consider after he is his  course of antibiotics, put him on prophylaxis of Macrobid.  We will  defer to PCP on this.  They will also need to  check a CBC to follow up  on the patient's leukocytosis and a BMET to follow up on his  electrolytes and renal function.   CONSULTATIONS:  None.   PROCEDURES PERFORMED.:  A chest x-ray was performed on November 30, 2008, that showed no active cardiopulmonary disease.   ADMISSION HISTORY AND PHYSICAL:  Mr. Deloyd Handy is a 60 year old  gentleman with a history of recurrent pneumonias and urinary tract  infections with underlying diagnosis of severe multiple sclerosis.  The  patient has been bed bound for the past several years with pressure  ulcers on his sacrum and also a neurogenic bladder.  The patient had  been recently discharged from the [facility] on October 29, 2008.  Since then, he had been doing well up until the day of  admission when his mother noticed that he was slightly more confused  than his usual self, was not able to answer clearly, but instead was  mumbling.  Per the patient's mother every time he felt this way she  wanted to bring  him to the hospital because she thinks that something  may be going.  She brought him in and after giving IV fluids, he was  feeling much better and back at his baseline at that time per admitting  physician, but noticed to have a urinary tract infection.  The patient  had become uroseptic in the past.  [facility] were called for  admission.  The patient was eating, continued to eat his meals and was  doing okay.  The patient had severe dysphagia secondary to multiple  sclerosis, but does not wish to have a PEG tube placed.  The patient  understands his prognosis and his disease and has very reasonable  expectations.  Besides slightly garbled speech on the day of admission,  no other complaints.  The patient has not had any fever or chills.  He  is afebrile in the ED.   PHYSICAL EXAMINATION:  VITAL SIGNS:  Per admitting physician,  temperature 98.5, blood pressure 153/85, pulse of 115, respirations 20.   Saturating 98% on room air.  GENERAL:  The patient is currently in no acute distress, back at his  baseline, smiling and answering questions appropriately.  HEENT:  Normocephalic, atraumatic.  Pupils equal, round and reactive to  light.  Extraocular movements intact.  Somewhat dry mucous membranes.  Normal skin turgor.  RESPIRATORY:  Lungs are clear to auscultation bilaterally.  CARDIOVASCULAR:  Tachycardiac, regular rhythm.  ABDOMEN:  Soft, nontender, nondistended.  Positive bowel sounds.  EXTREMITIES:  No clubbing, cyanosis or edema.  The patient was not able  to move neither his arms or his legs at that point without significant  assistance.   ADMISSION LABS:  CBC - white count 16.6, hemoglobin of 15, sodium 134,  potassium 4.1, creatinine 1.01, calcium of 9.3.  UA showed white blood  cells too numerous to count and many bacteria.  Chest x-ray showed no  evidence of pneumonia.   HOSPITAL COURSE:  1. Pseudomonas urinary tract infection.  The patient was admitted for      a urinary tract infection.  Urine cultures were obtained.  The      patient was hydrated with IV fluids and placed empirically on IV      Rocephin while the urine cultures were pending.  The patient's      white count improved on a daily basis and the patient remained      afebrile throughout the hospitalization.  Urine cultures came back      which grew out Pseudomonas, greater than 100,000, which were      pansensitive and as such, the patient will be discharged home on      ciprofloxacin 500 mg p.o. b.i.d. for the next 11 days to complete a      2-week course for a complicated urinary tract infection.  On follow-      up, the patient will follow up with PCP and on follow-up, the      patient will need a repeat urinalysis done for resolution of      urinary tract infection.  May consider placing the patient      prophylactically on Macrobid secondary to multiple hospitalizations      due to recurrent urinary  tract infections.  2. Altered mental status.  The patient had presented with altered      mental status.  This was felt likely secondary to problem #1 and      mild dehydration.  The patient had been hydrated with IV  fluids in      the emergency department.  He was placed on IV Rocephin.  The      patient returned quickly back to his baseline and remained      asymptomatic throughout the hospitalization.  It was felt the      patient's altered mental status was likely secondary to his urinary      tract infection and dehydration.  The patient remained stable      throughout the hospitalization and was back at baseline by day of      discharge.  3. Mild dehydration.  The patient was noted on exam to be clinically      dehydrated.  The patient was hydrated with IV fluids.  The patient      became euvolemic by hospital day #2.  The patient was tolerating      p.o.'s and eating adequately.  The patient's IV fluids were then      saline locked.  By the day of discharge, the patient was euvolemic      and the patient was discharged in stable and improved condition.   On the day of discharge, vital signs; temperature 97.5, blood pressure  135/87, pulse of 89, respiratory rate 21.  Saturating 100% on room air.   DISCHARGE LABS:  Sodium 138, potassium 3.6, chloride 108, bicarb 22, BUN  6, creatinine 0.81, glucose of 95, calcium of 8.6.  CBC - white count of  12.7, hemoglobin 13.6, platelets of 139, hematocrit of 40.0.      Ramiro Harvest, MD  Electronically Signed     DT/MEDQ  D:  12/03/2008  T:  12/03/2008  Job:  [id number]   cc:   Gretta Arab. Valentina Lucks, M.D.  Fax: [phone]

## 2011-02-27 NOTE — H&P (Signed)
[name], HERITAGE NO.:  [id number]   MEDICAL RECORD NO.:  [id number]          PATIENT TYPE:  EMS   LOCATION:  MAJO                         FACILITY:  [facility]   PHYSICIAN:  Hollice Espy, M.D.DATE OF BIRTH:  Jun 27, 1951   DATE OF ADMISSION:  12/19/2008  DATE OF DISCHARGE:                              HISTORY & PHYSICAL   PRIMARY CARE Blossie Raffel:  Dr. Maurice Small.   CHIEF COMPLAINT:  Weakness.   HISTORY OF PRESENT ILLNESS:  The patient is a 60 year old African  American male with past medical history of MS and oropharyngeal  dysphagia who was just discharged from the [facility]  approximately 2 weeks ago.  At that time he had come in for Pseudomonas  urinary tract infection with secondary altered mental status which the  patient has a history of recurrent UTI.  He was found to have  pansensitive Pseudomonas and sent home on Cipro.  He had finished a  course but then for the last several days the patient has had problems  with increased weakness and confusion.  Family became concerned because  they have known from previous events that this likely indicates  reinfection and they brought him back to the emergency room.  In the  emergency room he was noted to have a temperature of 100.5 a heart rate  of 119.  His blood pressure was stable and labs were ordered.  He was  found to have a normal white count of 9, however, with an 84% shift.  A  UA was done which is found to have a large leukocyte esterase with 21-50  white cells, many bacteria and also many yeast.  Because of the multiple  previous hospitalizations, the patient was given a dose of cefepime and  vancomycin.  Currently he is able to communicate somewhat and says that  he is feeling somewhat better.  He complains of pain on his buttocks  where he has stage II decubitus ulcer.  He is otherwise not voicing any  other complaints.  He tells me he is not short of breath.  He denies any  chest pain.  No headaches or abdominal pain.  He tells me that he can  move his legs but with assistance.  I am unable to get much more review  of systems than that.   PAST MEDICAL HISTORY:  Extensive, including a history of multiple  sclerosis, stage III decubitus ulcer, oropharyngeal dysphagia,  aspiration risk.  The patient has declined a PEG tube.  He is bed bound.  He has a neurogenic bladder status post suprapubic catheter, history of  recurrent UTI, recent Pseudomonas UTI.  History of toxic encephalopathy,  aspiration pneumonia.   DISCHARGE MEDICATIONS:  1. The patient is on oxybutynin 5.  2. Multivitamin 1.  3. Completed a course of Cipro just several days ago.   ALLERGIES:  He has no known drug allergies.   SOCIAL HISTORY:  Lives at home.  No tobacco, alcohol or drug use.   FAMILY HISTORY:  Noncontributory.   PHYSICAL EXAMINATION:  VITALS:  On admission temperature 100.5, heart  rate  119, blood pressure 142/88, respirations 18, O2 sat 98% on room  air.  GENERAL:  He is actually alert.  He is oriented x2 and despite his MS  and representation, the patient is actually of sound mentation.  HEENT:  Mucous membranes are dry.  HEART:  Regular rhythm, mild tachycardia.  LUNGS:  Clear to auscultation bilaterally.  ABDOMEN:  Soft, nontender and nondistended.  Positive bowel sounds.  EXTREMITIES:  Essentially flaccid.  No muscle tone.  No edema.  1+  peripheral pulses.  He has a stage III decubitus bed sore.   LAB WORK:  Sodium 138, potassium 4.5, chloride 108, bicarb 25, BUN 11,  creatinine 0.9, glucose 95.  White count 9 with 84% shift.  H and H 16  and 47, MCV of 88, platelet count 207,000.  Lactic acid 1.  UA notes  small hemoglobin, 40 ketones, protein 30, large leukocyte esterase,  nitrite positive with many bacteria, 21-50 white cells, 3-6 red cells,  many yeast.  ABG on 2L showed a pH 7.42, pCO2 35, pO2 109, bicarb 23.  A  chest x-ray actually shows no acute chest  findings.  Abdominal x-ray  notes large stool burden and ileus.   ASSESSMENT AND PLAN:  1. Urinary tract infection with yeast.  Will treat with IV Cipro and      Diflucan.  2. Systemic inflammatory response syndrome secondary to #1.  IV fluids      and antibiotics.  3. History of multiple sclerosis.  4. History of aspiration risk.  The patient declines PEG tube.  Will      continue diet with aspiration precautions and nectar-thick liquids,      puree diet.  5. Sacral decubitus wound.  Wound care consult.  IV vancomycin.  6. History of neurogenic bladder.  Continue oxybutynin.  7. Constipation.  Will give Reglan and Fleet's enema.      Hollice Espy, M.D.  Electronically Signed     SKK/MEDQ  D:  12/19/2008  T:  12/19/2008  Job:  [id number]   cc:   Gretta Arab. Valentina Lucks, M.D.

## 2011-02-27 NOTE — Consult Note (Signed)
NAME:  [name], [name] NO.:  [id number]   MEDICAL RECORD NO.:  [id number]          PATIENT TYPE:  INP   LOCATION:  1342                         FACILITY:  [facility]   PHYSICIAN:  Wilson Singer, M.D.DATE OF BIRTH:  05-Aug-1951   DATE OF CONSULTATION:  10/26/2008  DATE OF DISCHARGE:                                 CONSULTATION   Reviewed medical records, received report from team, assessed the  patient and then met at the patient's bedside with the patient's mother  and caregiver and then spoke by telephone with his daughter, [name], cell phone number [phone] to discuss diagnosis, prognosis,  goals of care, end-of-life wishes, disposition and options.   PATIENT GOALS:  1. DNR/DNI.  2. No artificial feedings.  3. Comfort feeds of choice with known risk of aspiration.  4. Treat present illness/infections with all available medical      interventions in hopes of the patient's return to baseline; and      discharge home with present caregiving situation.  The patient does have a personal caregiver 8 hours a day and is  supported by [facility] nursing.   A detailed discussion was had today regarding advance directives,  concepts specific to code status, artificial feeding and hydration,  continued use of IV antibiotics and rehospitalizations.  The difference  between an aggressive medical intervention path versus a palliative  comfort care path for this patient at this time was detailed.  The  patient and family were appreciative of the opportunity to discuss this  difficult situation.  A Hard Choices booklet was left for the patient  and family to review and they are encouraged to call with questions or  concerns.  Palliative Care will continue to support holistically.   CHIEF COMPLAINT:  Dysphagia.   HISTORY OF PRESENT ILLNESS:  This is a 60 year old black male with end  stage multiple sclerosis.  He is bed-bound, quadriplegic,  dysphasic.  He  lives at home with his mother.  He is supported by a personal caregiver  and [facility] nursing.  Per old records, he has had  6 hospitalizations in the last 12 months.  The patient is admitted at  this hospitalization on October 25, 2008, for fever, chills and changes  in mental status.  He is worked up and found to have right middle lobe  pneumonia.  The patient was admitted and is on broad spectrum  antibiotics.  The patient has a known stage III sacral decubitus.  Palliative Care consult was had today in light of the patient's advanced  disease and poor prognosis.  Goals are as outlined above.   PAST MEDICAL HISTORY:  1. Multiple sclerosis.  2. End stage neurogenic bladder.  3. Anemia.  4. Decubitus ulcer stage III.  5. Dysphasia.  6. Dysarthria.  7. Recurrent aspiration pneumonia.   ALLERGIES:  No known allergies.   MEDICATIONS:  Reviewed.  Please see MAR for active list of medications.   FAMILY HISTORY:  He has 2 siblings alive and well; otherwise  noncontributory.   SOCIAL HISTORY:  The patient does live at home with his mother.  He has  1 living daughter who is active in his care.  He is a retired Optician, dispensing.   REVIEW OF SYSTEMS:  As per mentioned in HPI and per family at bedside.  Continued weight loss.  No complaints of physical pain.  Denies  depression.  Otherwise systems are negative.   PHYSICAL EXAMINATION:  VITAL SIGNS:  Blood pressure 122/91, temperature  98.3, pulse 112, respirations 16, O2 sats on room air 95%.  GENERAL:  This is a chronically ill-appearing, frail, cachectic black male in no  acute distress.  HEENT:  Head:  Atraumatic.  Positive temporal muscle  wasting.  Eyes:  Pupils are equal and reactive to light.  Eyeballs  somewhat bulging.  Mouth:  Mucous membranes are moist.  Intact  dentition.  No exudate noted.  NECK:  Without JVD, lymphadenopathy or  thyromegaly.  LUNGS:  Diminished in the bases, scattered  rhonchi,  audible throat secretions.  HEART:  Tachycardic.  No murmur noted.  ABDOMEN:  Soft, nontender, positive bowel sounds.  EXTREMITIES:  Bilateral lower extremities with positive muscle wasting.  No edema  noted.  Bilateral upper extremities with muscle atrophy and contractions  secondary to MS.  NEUROLOGIC:  The patient is alert and oriented x3.  He  is appropriate to conversation.  Pleasant with intact sense of humor.  GI:  Suprapubic cath noted for small amount of clear amber urine.   LAB DATA:  Reviewed from January 12:  Sodium 137, potassium 3.5,  chloride 107, CO2 20, glucose 108, BUN 12, creatinine 1.12, calcium 9.2.  CBC:  WBCs 12.7, RBCs 4.99, hemoglobin 15.1, hematocrit 44.7, platelet  count 155.   Total time spent on the unit was 90 minutes.  Time in:  12 o'clock.  Time out:  1:30.  Counseling and coordination of care composed 50% or  greater portion of this interaction.  Diagnosis and prognosis was  clarified.  The concept of Hospice and palliative care was reviewed.  The team approach to our service was offered.  Values and goals of care  important to the patient's family were elicited.  The family is  encouraged to call with questions or concerns.  Palliative Care will  continue to assist with re-goals as needed.  Palliative Care will  support holistically.      Herbert Pun, NP      Wilson Singer, M.D.  Electronically Signed    MCL/MEDQ  D:  10/26/2008  T:  10/26/2008  Job:  [id number]   cc:   [name]. Ambrose Mantle, M.D.  Fax: [phone]

## 2011-02-27 NOTE — H&P (Signed)
NAMEWAI, LITT NO.:  [id number]   MEDICAL RECORD NO.:  [id number]          PATIENT TYPE:  INP   LOCATION:  1401                         [facility]AC[facility]   PHYSICIAN:  Gordy Savers, MDDATE OF BIRTH:  April 30, 1951   DATE OF ADMISSION:  04/24/2008  DATE OF DISCHARGE:                              HISTORY & PHYSICAL   CHIEF COMPLAINT:  Abdominal pain and nausea.   HISTORY OF PRESENT ILLNESS:  The patient is a 60 year old African  American male with a history of advanced MS who has been bedfast for  approximately 17 years.  He presented to the emergency room today via  ambulance with a 1-day history of abdominal distention, nausea and  vomiting.  He is accompanied by his mother who states that a nurse aide  was required to perform a digital fecal disimpaction earlier today.  He  has had a history of chronic constipation.  A KUB revealed a distended  stomach and generalized ileus as well as a generous stool burden.  The  patient is now admitted for further evaluation and treatment of  suspected pseudo-obstruction secondary to a fecal impaction.   PAST MEDICAL HISTORY:  1. History of MS with quadriplegia for 17 years.  Complications      include chronic urinary tract infections, neurogenic bladder and a      suprapubic catheter placement.  2. Chronic anemia.  3. History of decubiti.  4. Aphasia.  5. History of aspiration pneumonia requiring ventilatory support.  6. Remote tonsillectomy.   CODE STATUS:  His medical records were reviewed and the patient has had  DNR orders in the past.   MEDICATIONS:  He is accompanied by his mother who is unaware of any  medications.  Review of his chart reveals discharge medications of  thiamine and multivitamins only.   FAMILY HISTORY:  Noncontributory.  Please see chart for details.  He has  had multiple admissions to the Health System over the years.   SOCIAL HISTORY:  He lives at home in the care of his family.   Also  receives daily visits with nursing aids.   PHYSICAL EXAMINATION:  GENERAL:  Exam revealed a well-developed [place]  American male who is alert, no dysphasia, appeared to be in no acute  distress.  SKIN:  Warm and dry without rash.  VITAL SIGNS:  Temperature 98.4, blood pressure 160/90, pulse rate 94.  HEAD AND NECK:  Revealed no signs of trauma.  Pupil responses were  normal.  Conjunctiva clear.  The patient had a strabismus with left eye  externally deviated.  Pupil responses were normal.  Oropharynx was  benign.  Mucosal membranes appeared pink and well-hydrated.  NECK:  No bruits or adenopathy.  No neck vein distention.  She has clear  anterolaterally.  CARDIOVASCULAR:  Regular rhythm with a pulse rate 90-100.  There is a  grade 2/6 brief systolic murmur.  ABDOMEN:  Revealed generalized distention and tympany.  There is no  tenderness or guarding.  A suprapubic catheter noted in the lower  midline.  EXTREMITIES:  Revealed significant muscle wasting.  The patient  had  complete paralysis of his lower extremities.  Pedal pulses were intact.  Upper extremities revealed considerable weakness, but he had a grade 2/5  grip strength.  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   IMPRESSION:  Ileus versus pseudo-obstruction secondary to fecal  impaction, favor the latter.   ADDITIONAL DIAGNOSES:  1. Multiple sclerosis with paraplegia.  2. Chronic urinary tract infections.  3. History of multidrug resistant UTIs.  4. Neurogenic bladder status post suprapubic catheter.  5. History of chronic anemia.  6. History of decubiti.  7. Dysphagia.   DISPOSITION:  The patient will be admitted to the hospital.  He will be  treated with cleansing enemas and a follow-up KUB will be reviewed in  the morning.  He will be supported to IV fluids and made n.p.o. except  for medications.  In view of his gastric distention, a nasogastric tube  will be placed.  Followup electrolytes and  CBC will be reviewed in the  morning.      Gordy Savers, MD  Electronically Signed     PFK/MEDQ  D:  04/25/2008  T:  04/25/2008  Job:  [id number]

## 2011-02-27 NOTE — H&P (Signed)
NAMEFELIBERTO, STOCKLEY              ACCOUNT NO.:  [id number]   MEDICAL RECORD NO.:  [id number]          PATIENT TYPE:  INP   LOCATION:  1405                         FACILITY:  [facility]   PHYSICIAN:  Michiel Cowboy, MDDATE OF BIRTH:  [birth year]   DATE OF ADMISSION:  11/30/2008  DATE OF DISCHARGE:                              HISTORY & PHYSICAL   PRIMARY CARE PHYSICIAN:  Gretta Arab. Valentina Lucks, M.D.   CHIEF COMPLAINT:  Altered mental status and transiently garbled speech  per his family.   HISTORY OF PRESENT ILLNESS:  The patient is a 60 year old male with  history of recurrent pneumonia and urinary tract infection with  underlying diagnosis of severe multiple sclerosis.  The patient is bed-  bound for the past 70 years with pressure also on his sacrum and  neurogenic bladder.  Patient has been recently discharged from [facility]  hospitalist service on January 15.  Since then, he was doing well up  until today when his mother noticed that he was slightly more confused  than his usual self and was not able to answer her clearly, but instead  was mumbling.  Per her, every time he feels like this, she wants to  bring him to the hospital because she thinks that something may be going  on.  She brought him in and after giving IV fluids, he was feeling much  better and currently back to his baseline, but was noticed to have a  urinary tract infection.  The patient has become uroseptic in the past,  and ED called [facility] hospitalist for admission.  Otherwise, he is eating.  He continues to eat meals and able to do well.  He has severe dysphagia  secondary to multiple sclerosis, but does not wish to have a PEG tube  placed.  The patient understands his prognosis and his disease and has  very reasonable expectations.  Besides slightly garbled speech today, no  other complaints.  He has not had a fever or chills.  He is afebrile in  the emergency department.   PAST MEDICAL HISTORY:  1. History of  recurrent pneumonia.  2. Stage III sacral decubitus ulcer.  3. Oropharyngeal dysphasia, respiration risk.  Does not wish to have      PEG tube placement.  4. Multiple sclerosis, bed-bound.  5. Neurogenic bladder.  6. Recurrent urinary tract infection.  7. Recurrent confused state secondary to UTIs.   SOCIAL HISTORY:  The patient lives at home, has nursing aide come out.  Does not smoke or drink, does not abuse drugs.   FAMILY HISTORY:  Noncontributory.   ALLERGIES:  NO KNOWN DRUG ALLERGIES.   MEDICATIONS:  The patient continues to take oxybutynin 5 mg daily as  well as multivitamin.   PHYSICAL EXAMINATION:  VITALS:  Temperature 98.5 blood pressure 153/85,  pulse 115, respirations 20, saturating 98% on room air.  The patient  appears to be currently in acute distress, back to his baseline, smiling  and answering questions appropriately.  HEENT:  Nontraumatic.  Somewhat still dry mucous membranes, but normal  skin turgor.  LUNGS:  Clear to auscultation  bilaterally.  HEART:  Regular rate and rhythm but rapid.  ABDOMEN:  Soft, nontender and nondistended.  LOWER EXTREMITIES:  Without clubbing, cyanosis or edema, but thin.  The  patient was not able to move neither his arms or legs at this point  without significant assistance.   LABORATORY DATA:  White blood cell count 16.6, hemoglobin 15, sodium  137, potassium 4.1, creatinine 1.01, calcium 9.3.  UA showing white  blood cell count too numerous to count and bacteria many.  Chest x-ray  showing no evidence of pneumonia.   ASSESSMENT/PLAN:  This is a 60 year old gentleman with recurrent urinary  tract infection secondary to neurogenic bladder secondary to multiple  sclerosis.  1. Urinary tract infection.  I wonder if the patient is colonized      since a lot at times when they obtain a urine culture it is showing      multiple strains.  Consider perhaps leaving the patient on Macrobid      after discharge.  For right now, will  cover with Rocephin, since in      the past when he had urinary tract infection with enterobacter that      was sensitive to Rocephin.  Will follow white blood cell count and      see if it is improving.  2. Mental status changes.  Most likely related to UTI as this has been      happening for the patient many times in the same way, is currently      resolved.  Will continue to monitor.  3. Dysphasia.  Continue his dysphasia diet.  The patient confirms he      does not wish to have PEG tube and understands risk for aspiration.      Will give IV fluids and follow clinically.  4. Prophylaxis.  Protonix, SCDs.  5. Code status:  DNR/DNI.  6. Mild dehydration.      Michiel Cowboy, MD  Electronically Signed     AVD/MEDQ  D:  12/01/2008  T:  12/01/2008  Job:  [id number]   cc:   Gretta Arab. Valentina Lucks, M.D.  Fax: [phone]

## 2011-02-27 NOTE — H&P (Signed)
[name], [name]              ACCOUNT NO.:  [id number]   MEDICAL RECORD NO.:  [id number]          PATIENT TYPE:  INP   LOCATION:  0103                         FACILITY:  [facility]   PHYSICIAN:  Michiel Cowboy, MDDATE OF BIRTH:  Feb 20, 1951   DATE OF ADMISSION:  10/09/2007  DATE OF DISCHARGE:                              HISTORY & PHYSICAL   CHIEF COMPLAINT:  Weakness.   PRIMARY CARE PHYSICIAN:  Gretta Arab. Valentina Lucks, M.D.   CHIEF COMPLAINT:  Generalized weakness.   HISTORY OF PRESENT ILLNESS:  This is a 60 year old gentleman with  history of multiple sclerosis as well as history of repeated pneumonias.  Presents today with episodes of frustration and generalized weakness as  well as diarrhea.  The patient is taken care of at home by caretaker but  lives with his mother.  Per family, today, the patient has had about one  episode of diarrhea, but otherwise, has been slowly progressively  becoming weaker and weaker.  He is not able to tolerate much of p.o.  secondary to just difficulty holding the cup and only drinks by straw.  Per assistant, his urine has been orange colored for the past three  weeks or so, and he has had some diminished urine output.  He also has  had a trauma to his toe this morning when he almost completely lost his  toenail on the left.   PAST MEDICAL HISTORY:  1. Multiple sclerosis for 17 years.  2. Repeat episodes of pneumonia.   ALLERGIES:  No known drug allergies.   MEDICATIONS:  Multivitamin.   PHYSICAL EXAMINATION:  VITAL SIGNS:  Blood pressure 161/98, respirations  18, pulse 112, temperature 100.8, saturating 98% on room air.  GENERAL:  The patient appears to be very thin, within no acute distress.  HEENT:  Nontraumatic.  LUNGS:  Diminished breath sounds bilaterally secondary to diminished  effort.  ABDOMEN:  Nontender, nondistended.  HEART:  Regular rate and rhythm, but gravid, no murmurs.  EXTREMITIES:  Cachectic.  Red looking toenail on his  left toe.  SKIN:  Per nursing staff, consistent with granulated heeling ulcers on  the sacrum and left buttock.  NEUROLOGICAL:  The patient is unable to cooperate.   LABORATORY DATA:  UA shows 3-6 white blood cells, moderate leukocyte  esterase.  Sodium 141, potassium 4.2, bicarbonate 21, creatinine 1.0,  BUN 4.1, total protein 8.2, white blood cell 17.8.  H&H 17/51.  Platelets 183.   Chest x-ray showed no acute cardiopulmonary disease.  KUB showed colon  for stool but no ileus.   ASSESSMENT/PLAN:  This is a 60 year old gentleman with multiple  sclerosis, presents with diarrhea and severe dehydration.  1. Severe dehydration.  Will rehydrate with normal saline to 150 and      watch electrolytes.  Dehydration likely secondary to poor hydration      versus new onset diarrhea.  2. Diarrhea.  Cause at this point unclear.  Will send for Clostridium      difficile and will also check fecal white blood cell count.  The      fact that the patient has  low-grade fever is making infectious      etiology somewhat more likely.  3. Low-grade fever and white blood cell count elevation.  Could be      secondary to a sacral ulcer versus toenail versus gastroenteritis.      Will obtain blood cultures and repeat chest x-ray once the patient      is more hydrated.  Will cover with Avelox per 100 mg IV daily.  4. Prophylaxis Protonix and Lovenox.   DISPOSITION:  Given difficulty to swallow, obtain swallow evaluation.  Given wound care, needs to have wound care nursing evaluation.  Will  start the patient on thiamine once daily.  The patient's family is  interested in placement or at least additional help.  Will discuss with  Child psychotherapist.  Overall, will admit to telemetry and watch for early  sepsis versus SIRS.      Michiel Cowboy, MD  Electronically Signed     AVD/MEDQ  D:  10/10/2007  T:  10/10/2007  Job:  [id number]   cc:   Gretta Arab. Valentina Lucks, M.D.  Fax: [phone]

## 2011-02-27 NOTE — Discharge Summary (Signed)
NAMEPERLEY, [name] NO.:  [id number]   MEDICAL RECORD NO.:  [id number]          PATIENT TYPE:  INP   LOCATION:  1437                         FACILITY:  [facility]   PHYSICIAN:  Ramiro Harvest, MD    DATE OF BIRTH:  06-13-1951   DATE OF ADMISSION:  10/09/2007  DATE OF DISCHARGE:  10/14/2007                               DISCHARGE SUMMARY   The patient's primary care physician is Dr. Maurice Small.   DISCHARGE DIAGNOSES:  1. Severe dehydration.  2. Viral gastroenteritis.  3. Sacral wound.  4. Leukocytosis.  5. Anemia of chronic disease.  6. Multiple sclerosis x 17 years.   DISCHARGE MEDICATIONS:  1. Avelox 400 mg p.o. daily x5 days.  2. Multivitamin 1 tablet p.o. daily.  3. Vitamin B1 p.o. daily.   DISPOSITION AND FOLLOW-UP:  The patient will be discharged home with  home health, PT, OT, home health R.N. and home health safety evaluation  and home health aide.  The patient will need a small piece of  Aquacel  applied to the left inner buttocks wound daily, also some dry gauze to  the left toe daily.  The patient will be on a dysphagia III diet with  chopped meat with thin liquids, no rice, corn or cornbread.  The patient  will need to call to schedule a follow-up appointment with PCP in 2  weeks.  On follow-up, a CBC will need to be obtained to follow up on the  patient's hemoglobin level.  Further workup of the patient's hemoglobin  may be needed and the patient has not had a colonoscopy done in the  past, would likely need one done.  Basic metabolic profile needs to be  checked to follow up on the patient's electrolytes as well.   PROCEDURES PERFORMED:  A chest x-ray was performed on October 11, 2007  which showed linear atelectasis in the lingula, no acute cardiopulmonary  disease otherwise.  Chest x-ray performed October 10, 2007 showed no  acute disease in the chest.  Acute abdominal series was ordered October 10, 2007 that showed no acute  cardiopulmonary process, gas and stool  throughout, nondilated colon, no evidence for small bowel obstruction.  A modified barium swallow was done on October 10, 2007.   CONSULTATIONS:  None.   BRIEF ADMISSION HISTORY AND PHYSICAL:  [name] is a 60-year-  old gentleman with history of multiple sclerosis as well as history of  repeated pneumonia that presented to the ED with episodes of prostration  and generalized weakness as well as diarrhea.  The patient is taken care  of at home by caretaker but lives with his mother.  Per family, the  patient had had about one episode of diarrhea but otherwise had been  slowly progressively becoming weaker and weaker.  The patient was not  able to tolerate much p.o. intake secondary to just difficulty holding  the cup and only drinks by a straw.  Per assistant, his urine had been  orange colored for the past 3 weeks or so and had somewhat diminished  urine output.  The patient  also had a trauma to his toe on the morning  of admission when he almost completely lost his toenail on the left.   PHYSICAL EXAMINATION:  BP 161/98, pulse of 112, respirations 18,  temperature 100.8, saturating 98% on room air.  General:  The patient  appears to be very thin with no acute distress.  HEENT:  Normocephalic,  atraumatic.  Pupils equal, round and reactive to light.  Extraocular  movements are intact.  Oropharynx was dry, clear, no lesions, no  exudates.  Respiratory:  Diminished breath sounds bilaterally secondary  to diminished effort.  ABDOMEN:  Soft, nontender, nondistended, positive  bowel sounds.  CARDIOVASCULAR:  Regular rate and rhythm.  No murmurs,  rubs or gallops.  EXTREMITIES:  Cachectic red looking toenail on the  left toe.  SKIN:  Per nursing staff consistent with granulated healing  ulcers on the sacrum and left buttock.  NEUROLOGICAL EXAM:  The patient  was unable to cooperate.   ADMISSION LABS:  Sodium 141, potassium 4.2, chloride  104, bicarbonate  25, glucose 95, BUN 8, creatinine 1.0, total bilirubin 1.3, alkaline  phosphatase 67, AST 29, ALT 24, total protein 8.2, albumin 4.1, calcium  of 10.6.  CBC:  White count 17.8, hemoglobin 17.0, hematocrit 51.1,  platelet count 183, ANC of 16.9.  Urinalysis:  UA was amber, cloudy,  specific gravity 1.027, pH of 5.5, glucose negative, bilirubin small,  greater than 80 ketones, blood large, protein negative, urobilinogen 1,  nitrite negative, leukocytes moderate, urine microscopy 3-6 white blood  cells, RBCs 11-20, a PT of 14.0, INR of 1.1, PTT of 33, a CK of 57, TSH  of 0.487.   HOSPITAL COURSE:  1. Severe dehydration.  The patient was brought in with severe      dehydration.  The patient was aggressively hydrated with IV fluids.      The patient's symptoms improved.  The patient's weakness improved      daily and by the day of discharge, the patient was in stable and      improved condition back to his baseline.  2. Viral gastroenteritis.  The patient had had a bout of diarrhea      prior to admission.  During the hospitalization, the patient had no      more episodes of diarrhea.  The patient remained afebrile and the      patient was able to tolerate p.o.  The patient will be discharged      in stable and improved condition.  3. Sacral wound.  The patient had a small sacral wound.  A wound care      consult was obtained.  Wound cultures were also obtained.  Wound      cultures grew a rare Staph aureus which was pan sensitive.  The      patient had been on Avelox already for 5 days during this      hospitalization and we will discharge home on five more days of      Avelox to complete a 10-day course of Avelox.  4. Leukocytosis.  It was felt this may be secondary to a sacral wound      versus gastroenteritis.  Blood cultures were drawn which were still      pending by day of discharge.  The patient's white count improved      daily.  The patient remained afebrile  throughout the      hospitalization.  Sacral wound cultures were also obtained.  Chest  x-ray was obtained which was negative.  The patient was placed      empirically on Avelox.  We are also awaiting cultures.  The patient      will be discharged home on 5 more days of Avelox to complete a 10-      day course of Avelox.  5. Anemia.  The patient is becoming a little bit anemic.  FOBT was      done which was positive.  An anemia panel was obtained.  The      patient's folate level was greater than 20.  The patient's B12      level was 478.  The patient had a ferritin of 165, total iron of      60, TIBC of 179.  The patient remained asymptomatic throughout the      hospitalization, did not have any melena, hematochezia or      hematemesis.  The patient's hemoglobin was stable throughout      hospitalization.  This could likely be due to anemia of chronic      disease.  However, the patient is 32 and if the patient has not had      a colonoscopy done, he will likely need a colonoscopy done as an      outpatient.  6. Multiple sclerosis times 17 years, stable throughout the      hospitalization.   On the day of discharge, the patient was in stable and improved  condition.  Vital signs on discharge, temperature 97.5, pulse of 77,  blood pressure 144/83, respiratory rate 20, saturating 97% on room air.   DISCHARGE LABS:  Magnesium 2.5, sodium 141, potassium 3.6, chloride 111,  bicarbonate 24, BUN 3, creatinine 0.73, glucose of 89, calcium of 8.2.  White count 7.1, hemoglobin 12.0, platelets 162, hematocrit 35.3.  The  patient will be discharged home in stable and improved condition to  follow up with his PCP in 2 weeks.  It has been a pleasure taking care  of [name].      Ramiro Harvest, MD  Electronically Signed     DT/MEDQ  D:  10/14/2007  T:  10/14/2007  Job:  [id number]   cc:   Gretta Arab. Valentina Lucks, M.D.  Fax: [phone]

## 2011-02-27 NOTE — H&P (Signed)
[name], [name]              ACCOUNT NO.:  [id number]   MEDICAL RECORD NO.:  [id number]          PATIENT TYPE:  INP   LOCATION:  1342                         FACILITY:  [facility]   PHYSICIAN:  Lucita Ferrara, MD         DATE OF BIRTH:  01/17/1951   DATE OF ADMISSION:  10/25/2008  DATE OF DISCHARGE:                              HISTORY & PHYSICAL   PRIMARY CARE PHYSICIAN:  Gretta Arab. Valentina Lucks, M.D.   CHIEF COMPLAINT:  Fever.   HISTORY OF PRESENT ILLNESS:  The patient is a 60 year old with a history  of end-stage multiple sclerosis, bed-bound, quadriplegic,  dysphagic,  and at some point on hospice care, who presents to [facility] with fevers, chills, decrease in mentation and activities from  his baseline.  He is known to have dysphagia and aspiration.  In the  emergency room he was obviously found to have right middle lobe  pneumonia.  He is cachectic, and this is secondary to malnutrition from  his chronic medical status.   His past medical history is significant for  1. Multiple sclerosis, quadriplegia, dysphagia.  2. Neurogenic bladder status post suprapubic catheter.  3. Anemia.  4. History of decubitus ulcer.  5. Dysphagia, aphagia.  6. History of aspiration pneumonia that is recurrent  7. History of tonsillectomy.   REVIEW OF SYSTEMS:  As per HPI, otherwise negative.   ALLERGIES:  No known drug allergies.   MEDICATIONS:  Include oxybutynin.   SOCIAL HISTORY:  He lives at home with his family.   PHYSICAL EXAMINATION:  GENERAL:  The patient is cachectic, in no obvious  distress.  VITAL SIGNS:  Blood pressure 149/102, pulse of 24, respirations 20, T  max 101.1.  HEENT: Mucous membranes are dry.  NECK: Supple.  CARDIOVASCULAR: S1, S2.  Tachy.  LUNGS: Clear to auscultation bilaterally without rhonchi, rales or  wheeze.  EXTREMITIES: No clubbing, cyanosis or edema.   LABORATORY DATA:  The patient's hemoglobin was 17.7, hematocrit 52.  Sodium 140,  potassium 3.6, chloride 102.  CBC shows white count of 14,  hemoglobin of 15.2, hematocrit of 46.4 and platelet count 161.  Urinalysis positive nitrites and positive for large amount of leukocyte  esterase.  His chest x-ray shows a right basal pneumonia.   ASSESSMENT/PLAN:  The patient is a 59 year old with:  1. Fevers, leukocytosis, positive urinalysis for presumptive urinary      tract infection.  Positive chest x-ray  showing a right middle lobe      pneumonia.  2. History of multiple sclerosis end-stage with dysphagia,      quadriplegia, and neurogenic bladder.  3. Do not resuscitate/do not intubate status.  4. Stage 2-3 sacral decubitus ulcer.   DISCUSSION:  The patient will be admitted to the medical floor.  The  patient will be started on IV hydration, IV broad-spectrum antibiotics.  I have reviewed his cultures in the past and most sensitivity for MIC to  Levaquin.  Will initiate Levaquin/vancomycin and blood cultures will be  drawn.  IV fluids will be initiated. Labs will be followed.  The  patient  will be kept n.p.o. Palliative care will be consulted.   Wound care will be consulted for decubitus ulceration.  The rest of the  plans are dependent on his progress and goals of care. Ethics:  The  patient is DNR/DNI.      Lucita Ferrara, MD  Electronically Signed     RR/MEDQ  D:  10/25/2008  T:  10/25/2008  Job:  [id number]

## 2011-02-27 NOTE — H&P (Signed)
NAMEMARGARITO, [name]              ACCOUNT NO.:  [id number]   MEDICAL RECORD NO.:  [id number]          PATIENT TYPE:  INP   LOCATION:  0101                         FACILITY:  [facility]   PHYSICIAN:  Michiel Cowboy, MDDATE OF BIRTH:  Apr 19, 1951   DATE OF ADMISSION:  04/14/2009  DATE OF DISCHARGE:                              HISTORY & PHYSICAL   PRIMARY CARE Cylie Dor:  Dr. Consuella Lose C. Griffin.   CHIEF COMPLAINT:  Fever.   The patient is a 60 year old gentleman with history of recurrent urinary  tract infections, multiple sclerosis and dysphagia.  The patient unable  to provide me much of a history.  He is nonverbal, only able to shake  his head yes and no, but states that currently he is feeling somewhat  better.  Per ED, he was brought in from home with secondary to fevers.  In the emergency room, he was found to have another urinary tract  infection at which point [facility] was called for admission.  Otherwise, review of systems unobtainable.   PAST MEDICAL HISTORY:  1. Pseudomonas urinary tract infection.  2. Multiple sclerosis.  3. Stage III sacral decub ulcer.  4. 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  The patient does not      wish to have a G-tube placed and understands the risk of dysphagia      and aspiration.  5. Neurogenic bladder, status post suprapubic catheter.  6. Recurrent urinary tract infections.  7. History of recurrent pneumonia.   SOCIAL HISTORY:  The patient lives at home.  Per my last H and P, he  does not smoke or drink, does not abuse drugs.  He has a nursing aide to  come out and help him.   FAMILY HISTORY:  Noncontributory.   ALLERGIES:  NO KNOWN DRUG ALLERGIES.   MEDICATIONS:  1. Oxybutynin 5 mg as needed for bladder spasms.  2. Nitrofurantoin 100 mg b.i.d. for prophylaxis.  3. Multivitamins.   PHYSICAL EXAMINATION:  VITAL SIGNS:  Temperature 100.0, blood pressure  161/92, pulse 113, respirations 28.  Saturating 97% on room  air.  GENERAL:  The patient appears to be in no acute distress.  HEENT:  Head nontraumatic.  Dry mucous membranes.  Decreased skin  turgor.  LUNGS:  Anteriorly clear to auscultation, but occasional rhonchi  posteriorly.  HEART:  Rapid, but regular.  No murmurs appreciated.  ABDOMEN:  Soft, nontender, nondistended.  Suprapubic PICC catheter in  place.  LOWER EXTREMITIES:  Without clubbing, cyanosis or edema.  Severe muscle  wasting throughout.  SKIN:  There is a decubitus ulcer noted.  It  appeared to be very small, but draining.  The depth of this is difficult  to perceive   LABORATORY DATA:  Hemoglobin 17.3, sodium 143, potassium 4.2, creatinine  1.1.  UA positive for nitrites and white blood cell count.   ASSESSMENT AND PLAN:  This is a 60 year old gentleman with past medical  history significant for severe multiple sclerosis, bed-bound being  admitted for recurrent urinary tract infection.   1. Urinary tract infection.  The patient has history of recurrent  urinary tract infections in the past.  He had responded well to      Cipro, we will try again.  Await urine culture.  The patient      actually looks fairly well today.  We will send for blood cultures.      Although he is tachycardiac, he does appear to be dehydrated and      frequently I noticed that his heart rate has been elevated since he      has been admitted.  We will watch for any signs of sepsis and give      IV fluids.  2. Decubitus ulcer, appears to be actually healing.  We will get wound      care consult.  3. Dysphagia.  The patient again confirms he does not wish to have any      PEG tube placement or G-tube.  We will continue dysphagia diet,      nectar-thick liquids and pureed diet.  4. Prophylaxis.  Protonix and Lovenox.  5. Code status.  The patient wished to be do not resuscitate/do not      intubate, which was confirmed with him again.      Michiel Cowboy, MD  Electronically  Signed     AVD/MEDQ  D:  04/14/2009  T:  04/14/2009  Job:  [id number]   cc:   Gretta Arab. Valentina Lucks, M.D.  Fax: [phone]

## 2011-02-27 NOTE — Discharge Summary (Signed)
[name], [name]              ACCOUNT NO.:  [id number]   MEDICAL RECORD NO.:  [id number]          PATIENT TYPE:  INP   LOCATION:  1509                         FACILITY:  [facility]   PHYSICIAN:  Corinna L. Lendell Caprice, MDDATE OF BIRTH:  January 28, 1951   DATE OF ADMISSION:  10/31/2007  DATE OF DISCHARGE:  11/06/2007                               DISCHARGE SUMMARY   DISCHARGE DIAGNOSES:  1. Pseudomonas urinary tract infection with sepsis.  2. Left buttock decubitus present on admission.  3. Oropharyngeal dysphasia.  4. Multiple sclerosis.  5. Anemia of chronic disease.   DISCHARGE MEDICATIONS:  Ciprofloxacin 500 mg twice daily until gone.  He  may continue his other outpatient medications as previously.  See H&P.   CONDITION:  Stable.   ACTIVITY:  Decubitus precautions.  He is being sent home with home PT,  OT and speech therapy.   DIET:  Pureed with thin liquids.   FOLLOW-UP:  With Dr. Maurice Small in 4 weeks.   PERTINENT LABORATORY DATA:  Initial white blood cell count was 23,000  and normalized to 8000.  The rest of his CBC was unremarkable.  PT/PTT  normal.  Basic metabolic panel unremarkable.  LFTs essentially  unremarkable.  Prealbumin 7.8.  Urinalysis showed greater than 300  protein, positive nitrite, large leukocyte esterase, large hemoglobin,  small bilirubin, 40 ketones, too numerous to count white cells, too  numerous to count red cells, many bacteria.  Blood cultures negative.  Urine culture grew out Pseudomonas aeruginosa, which was pansensitive.  Culture of the decubitus grew out MRSA, which was felt to be  colonization.   SPECIAL STUDIES/RADIOLOGY:  EKG showed sinus tachycardia with rate of  138, right atrial enlargement, left axis deviation, pulmonary disease  pattern, age-indeterminate inferior infarct.  Chest x-ray showed nothing  acute.   HISTORY AND HOSPITAL COURSE:  [name] is a pleasant 60 year old  black male patient of Dr. Maurice Small with a  history of multiple  sclerosis, who was admitted for UTI and sepsis.  He had noted bloody  urine and the patient's home nurse noted him to be tachycardiac into the  130s.  He had a temperature of 103.  He had recently completed a 10-day  course of Avelox.  The patient was started on IV fluids and antibiotics.  Please see H&P for  admission details and physical examination.  By the time of discharge he  was afebrile. his leukocytosis had resolved and he was feeling back to  his baseline.  He had a speech evaluation and it was recommended that he  be on a pureed diet.  He worked with PT and OT as well.      Corinna L. Lendell Caprice, MD  Electronically Signed     CLS/MEDQ  D:  11/26/2007  T:  11/28/2007  Job:  [id number]

## 2011-02-27 NOTE — H&P (Signed)
[name], JOLES NO.:  [id number]   MEDICAL RECORD NO.:  [id number]          PATIENT TYPE:  INP   LOCATION:  1604                         [facility]ACILITY:  [facility]   PHYSICIAN:  Maude Leriche, MD      DATE OF BIRTH:  05-08-1951   DATE OF ADMISSION:  04/11/2008  DATE OF DISCHARGE:                              HISTORY & PHYSICAL   PRIMARY CARE PHYSICIAN:  Gretta Arab. Valentina Lucks, M.D., [facility].   CHIEF COMPLAINT:  Decreased loss of consciousness and failure to thrive.   HISTORY OF PRESENT ILLNESS:  This is a 60 year old African American male  who is quadriplegic secondary to multiple sclerosis and has recently  been treated with antibiotics for urinary tract infection who presents  with increased confusion, darkened urine, suprapubic catheter and  failure to thrive.  The patient was recently treated with Cipro for UTI  and has recent urinary cultures showing Klebsiella, pseudomonas and  other species.   REVIEW OF SYSTEMS:  Unobtainable due to no family available.   PAST MEDICAL HISTORY:  1. Frequent UTIs.  2. Quadriplegia secondary to multiple sclerosis and chronic anemia.   FAMILY HISTORY:  Noncontributory.   SOCIAL HISTORY:  No tobacco, alcohol or drugs.  Lives with his mother.   ALLERGIES:  No known drug allergies.   MEDICATIONS:  1. Cipro.  2. Calcium.  3. Multivitamin.  4. Vesicare.   PHYSICAL EXAMINATION:  VITAL SIGNS:  Temperature 98.9, pulse 109,  respirations 16, blood pressure 147/97.  GENERAL:  A thin, alert, African American male who can say yes or  no, but no further conversation.  HEENT:  Poor dentition.  NECK:  Supple.  CHEST:  Clear to auscultation bilaterally.  HEART:  Regular rate and rhythm.  ABDOMEN:  Mild, nonfocal, tenderness to palpation without guarding or  rebound.  EXTREMITIES:  With poor muscle tone.  NEUROLOGICAL:  Upper and lower extremity contractures.   LABORATORY DATA:  Chest x-ray shows no acute  cardiopulmonary disease.  CBC:  Within normal limits.  Urinalysis shows 11-20 white cells per high-  powered field.  Nitrite positive, metabolic panel is within normal  limits.   IMPRESSION:  Patient with multiple sclerosis and quadriplegia with  altered mental status in the setting of a urinary tract infection that  has failed outpatient therapy.   PLAN:  Will admit for IV antibiotics with Zosyn and vancomycin secondary  to history of complicated UTIs with MRSA and other resistant organisms.  Will follow up culture  and change to outpatient regimen as soon as  possible for discharge planning.      Maude Leriche, MD  Electronically Signed     EWR/MEDQ  D:  04/12/2008  T:  04/12/2008  Job:  [id number]

## 2011-02-27 NOTE — H&P (Signed)
[name], [name]              ACCOUNT NO.:  [id number]   MEDICAL RECORD NO.:  [id number]          PATIENT TYPE:  INP   LOCATION:  0111                         [facility]ACILITY:  [facility]   PHYSICIAN:  Michiel Cowboy, MDDATE OF BIRTH:  1951/01/02   DATE OF ADMISSION:  10/31/2007  DATE OF DISCHARGE:                              HISTORY & PHYSICAL   ATTENDING PHYSICIAN:  Michiel Cowboy, MD   CHIEF COMPLAINT:  Confusion and bloody urine.   PRIMARY CARE PHYSICIAN:  Gretta Arab. Valentina Lucks, MD   HISTORY:  [name] is a pleasant 60 year old gentleman with a  longstanding history of multiple sclerosis for the past 17 years with  recent admission for severe dehydration.  The patient was just  discharged home on October 14, 2007, after an admission for dehydration  and sacral wound requiring 10 days of p.o. antibiotics secondary to  leukocytosis.  The patient was doing well up until this morning when his  nursing aide came in to see him and noted that he had dark bloody urine  in his Foley and was less responsive than usual.  EMS was called and the  patient was brought in to [facility] where it was noted that his urine  had many white blood cells as well as many red blood cells and many  bacteria.  The patient was noted to be tachycardic in the 130's.  Blood  pressure was 130's/80's.  He was noted to be febrile to 103 Fahrenheit.  [facility] was called to admit the patient.  Of note, the patient  has recently completed a course of 10 days of Avelox for a questionable  leukocytosis and small sacral ulcer.   PAST MEDICAL HISTORY:  Significant for multiple sclerosis x17 years,  anemia of chronic disease, leukocytosis, severe dependency.   HOME MEDICATIONS:  Per review of records include:  1. Multivitamin one tab p.o. daily.  2. Thiamine daily.   ALLERGIES:  No known drug allergies.   FAMILY HISTORY:  Noncontributory.   SOCIAL HISTORY:  The patient does not smoke, does not  drink.  Lives at  home, has nursing aides come out to help him.  Was recently discharged  with home health PT and OT.  His mother is elderly and unable to take  care of him but has a lot of help nursing.   REVIEW OF SYSTEMS:  Unable to obtain secondary to decreased mental  status from patient.   PHYSICAL EXAMINATION:  VITAL SIGNS:  Temperature 103.7, pulse 141,  respirations 20, blood pressure 150/96, satting 98% on room air.  GENERAL:  The patient is in no acute distress but unable to communicate,  lying in Trendelenburg position on the stretcher.  HEENT:  Dry mucous membranes.  Head atraumatic.  Eyes noted for  nonconjugate gaze.  LUNGS:  Clear to auscultation bilaterally.  HEART:  Rapid but regular.  ABDOMEN:  Scaphoid but nontender.  EXTREMITIES:  No clubbing, cyanosis or edema.  Very thin.  There is a  dry dressing noted on the great toe.  NEUROLOGICAL:  Nonfocal.   X-ray showing a clean chest x-ray.  Labs significant for white blood  cell count 23.2, hemoglobin 15.7, sodium 138, potassium 4.1, creatinine  0.94.  UA showing large blood, white blood cells and red blood cells too  numerous to count, positive nitrites, many bacteria.   IMPRESSION:  This is a 60 year old gentleman with multiple sclerosis now  with SIRS versus early sepsis likely secondary to urinary tract  infection.   SIRS (systemic inflammatory response syndrome infection) likely  secondary to urinary tract infection.  Will admit to stepdown.  Given  that the patient was recently on Avelox will start on Zosyn especially  since this patient was just recently discharged from the hospital and it  is unclear if he has a resistant gram negative urinary tract infection  and especially since he is possibly septic.  We will watch in stepdown,  give aggressive hydration.   History of decubitus ulcer, last time grew staph, these would be covered  by Zosyn as well.  We will order wound care.   Prophylaxis.  We will  do Protonix 40 mg IV daily.   DISPOSITION:  The patient had had PT and OT involvement when he was here  last time.  May need repeat social work evaluation since the patient  appeared to have increased hospitalizations in the past few months.   The patient's full code is discussed with family and patient.  Prophylaxis.  SCDs given the patient has remote history of hemoccult  positive stools.   Anemia, will Hemoccult stools, H and H.  The patient may need a  colonoscopy in the future.      Michiel Cowboy, MD  Electronically Signed     AVD/MEDQ  D:  10/31/2007  T:  10/31/2007  Job:  [id number]   cc:   Gretta Arab. Valentina Lucks, M.D.  Fax: [phone]

## 2011-02-27 NOTE — Consult Note (Signed)
NAMEPACEN, WATFORD              ACCOUNT NO.:  [id number]   MEDICAL RECORD NO.:  [id number]          PATIENT TYPE:  INP   LOCATION:  1320                         [facility]ACILITY:  [facility]   PHYSICIAN:  Wilson Singer, M.D.DATE OF BIRTH:  09-19-1951   DATE OF CONSULTATION:  09/15/2008  DATE OF DISCHARGE:                                 CONSULTATION   REFERRING PHYSICIAN:  Michiel Cowboy, M.D., [facility].   REASON FOR CONSULTATION:  Determine further goals of care and hospice  eligibility.   IMPRESSION:  1. Dysphagia secondary to end-stage multiple sclerosis.  Patient is      awaiting a modified barium swallow today and already has a modified      diet from bedside swallowing evaluation.  2. Gait abnormality.  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  3. Palliative Performance Score 30%.   RECOMMENDATIONS:  1. DNR established already.  2. Patient does not wish artificial nutrition in the way of PEG tube      feeding.  3. Patient is hospice eligible with a diagnosis of multiple sclerosis.  4. Patient and family at the present time wish for treatment of      intermittent infections.   HISTORY:  This very pleasant 60 year old man was diagnosed with multiple  sclerosis at the age of 26.  Six years later, he had deteriorated to the  point where he was unable to walk and has been bed-bound now for 17  years.  He has been cared for at his daughter's home by his daughter and  his mother.  There is also an aide that comes to the house 5 days a week  to help.  Patient, as mentioned above, is bed-bound and requires all  care with activities of daily living, although he has strength in his  arms sufficient to bring food to his mouth and swallow, although  swallowing is now becoming an issue.  He has had episodes of aspiration  pneumonia in the past.  He has a history of decubitus ulcers also.   He  has a neurogenic bladder, and he is status post suprapubic catheter.   PAST MEDICAL HISTORY:  1. Multiple sclerosis, as mentioned above, with quadriplegia for the      last 17 years.  2. History of chronic anemia.   PAST SURGICAL HISTORY:  1. Insertion of suprapubic catheter because of neurogenic bladder.  2. Remote history of tonsillectomy.   ALLERGIES:  None.   CURRENT MEDICATIONS:  1. Lovenox 40 mg daily.  2. Thiamine 100 mg daily IV.  3. Protonix 40 mg IV daily.  4. Dulcolax suppository 10 mg daily.  5. Ceftriaxone 1 gm daily.  6. Intravenous fluids with D5-1/2 normal saline at 75 cc/hr.   SOCIAL HISTORY:  Patient is not married.  He does have a 70 year old  daughter.  He lives with her at home as well as his mother.  Does not  smoke.  Does not drink alcohol.   REVIEW  OF SYSTEMS:  Apart from the symptoms mentioned above, there are  no other symptoms referable to all systems reviewed.   PHYSICAL EXAMINATION:  Patient is afebrile and hemodynamically stable  with a blood pressure of 110/64, pulse 93, respirations 14-16,  saturation 99% on 2 liters of oxygen.  Heart sounds are presently normal.  Lung fields are clinically clear.  ABDOMEN:  Soft and nontender with a suprapubic catheter in place, which  does not look infected around the site.  NEUROLOGIC:  He does appear to have quadriparesis, although there is  movement in his arms more so than his legs.  His legs are totally  immobile.   RELEVANT DATA:  BUN 6, creatinine 0.97, potassium 3.9, sodium 138,  albumin 2.9.  Hemoglobin 14.1, white blood cell count 8.5, platelets  156.   DISCUSSION:  This very pleasant 60 year old man is hospice-eligible with  a diagnosis of multiple sclerosis.  He is bed-bound with a Palliative  Performance Score of 30% and is at high risk of aspiration pneumonia  based on evaluations and also with a previous history of aspiration  pneumonia.  He has a history of decubitus ulcers also.   Goals are very  clear in terms of do not resuscitate and also no artifical nutrition.  The family will consider whether they want to accept hospice services.   Time spent was 45 minutes, more than 50% of which was discussing  pathophysiology of disease process and the concept of hospice and  palliative medicine.      Wilson Singer, M.D.  Electronically Signed     NCG/MEDQ  D:  09/15/2008  T:  09/15/2008  Job:  [id number]

## 2011-02-27 NOTE — Discharge Summary (Signed)
NAMEJEREME, [name]              ACCOUNT NO.:  [id number]   MEDICAL RECORD NO.:  [id number]          PATIENT TYPE:  INP   LOCATION:  1342                         [facility]ACILITY:  [facility]   PHYSICIAN:  Corinna L. Lendell Caprice, MDDATE OF BIRTH:  1950-10-24   DATE OF ADMISSION:  10/25/2008  DATE OF DISCHARGE:  10/29/2008                               DISCHARGE SUMMARY   DISCHARGE DIAGNOSES:  1. Pneumonia, most likely aspiration related.  2. Stage III sacral decubitus, present on admission.  3. 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  4. Multiple sclerosis, bed bound.  5. Neurogenic bladder.  6. DO NOT RESUSCITATE.  7. Urinary tract infection.  8. Toxic encephalopathy.   DISCHARGE MEDICATIONS:  1. Augmentin 500 mg p.o. b.i.d. until gone.  2. Continue oxybutynin 5 mg twice a day.  3. Geritol multivitamin daily.  4. Vitamin B daily.  5. MiraLax 17 grams as needed.   CODE STATUS:  Remains DO NOT RESUSCITATE.   CONDITION:  Stable.   ACTIVITY:  Decubitus precautions.   WOUND CARE:  Aquacel to sacral decubitus, change dressing twice weekly  and as needed for stool soilage.  Follow up with Dr. Valentina Lucks in 4 weeks.   DIET:  Should be pureed with nectar thickened liquids and aspiration  precautions.   CONSULTATIONS:  Palliative care medicine.   PROCEDURES:  None.   LABS:  White blood cell count is 14,000 with 83% neutrophils, 8%  lymphocytes.  The rest of his CBC was essentially unremarkable.  At  discharge his white blood cell count is 6800.  Complete metabolic panel  on admission was essentially unremarkable.  Urinalysis was turbid, 40  ketones, moderate blood, 30 protein, positive nitrite, large leukocyte  esterase, too numerous to count white cells, 7 - 10 red cells, many  bacteria.  Urine culture grew out greater than 100,000 colonies of  multiple bacterial morphotypes.  Blood cultures negative.  Stool for C.  diff negative.   SPECIAL STUDIES/RADIOLOGY:  Chest x-ray on  admission showed right  basilar pneumonia.   HISTORY AND HOSPITAL COURSE:  [name] is a 60 year old black male  who is bedridden and has a history of dysphagia and aspiration risk due  to progressive multiple sclerosis.  He is bed bound and had a stage III  pressure sore on the sacrum on admission.  There was no drainage.  There  was no surrounding cellulitis.  He was brought in for fever and  decreased level of consciousness.  He has recurrent urinary tract  infections.  In the emergency room his temperature was 101.1, blood  pressure 149/102.  He had dry mucous membranes.  He was found to have a  leukocytosis, right-sided pneumonia and a urinary tract infection.  The  patient was started on broad-spectrum antibiotics and IV fluids.  Palliative care medicine was consulted.  The patient's code status had  been DO NOT RESUSCITATE and this has been continued.  The patient has  known dysphagia, speech therapy was reconsulted and modified barium  swallow again showed risk for aspiration.  Per discussions with  palliative care, the patient did not want any  tube feedings and wished  to continue oral intake with known risk of recurrent pneumonia,  recurring aspiration, etc.  His mental status improved to baseline.  He  was eating 100% of his meals.  He was afebrile.  He had a normal white  count by the time of discharge.  His suprapubic catheter was changed and  he is being discharged home.      Corinna L. Lendell Caprice, MD  Electronically Signed     CLS/MEDQ  D:  10/29/2008  T:  10/29/2008  Job:  [id number]   cc:   Gretta Arab. Valentina Lucks, M.D.  Fax: [phone]

## 2011-02-27 NOTE — H&P (Signed)
[name], [name]              ACCOUNT NO.:  [id number]   MEDICAL RECORD NO.:  [id number]          PATIENT TYPE:  INP   LOCATION:  1320                         FACILITY:  [facility]   PHYSICIAN:  Michiel Cowboy, MDDATE OF BIRTH:  08-17-1951   DATE OF ADMISSION:  09/13/2008  DATE OF DISCHARGE:                              HISTORY & PHYSICAL   PRIMARY CARE Anakin Varkey:  Dr. Maurice Small.   CHIEF COMPLAINT:  Inability to swallow.   The patient is a 60 year old gentleman with history of severe multiple  sclerosis bed-bound with quadriplegia for past 17 years with recurrent  urinary tract infection, neurogenic bladder status post suprapubic  catheter.  The patient actually has been doing relatively well.  Recently he moved in with  his daughter, has been spending a lot of time  with his grandchildren.  He has a home health nurse who comes in and  takes care of him but he has been having swallowing difficulties in the  past with recurrent aspiration pneumonia requiring ventilatory support  and having difficulty with ingestion of enough nutrition and drinking.  He usually able to do so through straws but since today he was having  great difficulties with swallowing which is even worse than his  baseline.  I discussed with him his options.  Hopefully this is a  transient event and will get better but if not he does not wish to have  a PEG tube placed or any artificial means of nutritional support, in  fact, he wants to be DNR/DNI.  Given that he does have urinary tract  infection, [facility] hospitalist was called for the admission evaluation.  We will admit and treat.   PAST MEDICAL HISTORY:  Significant for as stated above:  1. History of multiple sclerosis, quadriplegia for 17 years.  2. History of neurogenic bladder status post suprapubic catheter.  3. Chronic anemia.  4. History of decubitus ulcers.  5. Aphagia.  6. History of aspiration pneumonia.  7. Remote tonsillectomy.   REVIEW OF SYSTEMS:  No nausea, no vomiting, no abdominal pain, no  distention.  Otherwise review of systems unremarkable.  Actually he is  doing fairly well.   ALLERGIES:  No known drug allergies.   MEDICATIONS:  Oxybutynin dose unknown and vitamins, which I suspect is  thiamine and multivitamin.   FAMILY HISTORY:  Noncontributory.   SOCIAL HISTORY:  The patient lives at home with his family and has  nursing aide visits.   VITALS:  Temperature 98.9.  Blood pressure was 64/97.  Pulse 113.  Respirations 16, satting 100% on room air.  The patient appears to be  actually in no acute distress, the best that I have seen him.  HEAD:  Nontraumatic.  Disconjugate gaze which is unchanged from prior  exams.  Somewhat dry mucous membranes but normal skin turgor.  LUNGS:  There is a lot of upper airway noises which is stable for him.  No wheezes noted.  ABDOMEN:  Soft, nontender, nondistended.  LOWER EXTREMITIES:  Atrophy in lower and upper extremities but no edema  noted.  NEUROLOGICALLY:  The patient is not  able to move neither of his legs.  No arms at this point.  Able to communicate somewhat, able to answer yes  and no to questions.   LABS:  White blood cell count 12.3, hemoglobin 15.8, sodium 136,  potassium 4.3, creatinine 1.1., calcium 9.2, total bili 1.4, AST 52.  A  UA showing positive nitrates, many, many bacteria.   A chest x-ray showing clear lungs.  No abnormalities.   ASSESSMENT:  This is a 60 year old gentleman with end-stage multiple  sclerosis, recurrent urinary tract infection comes in with difficulty  swallowing and another urinary tract infection.  1. Difficulty swallowing.  We will obtain a speech pathology consult      or try to do official swallow evaluation.  If the patient does not      pass and truly unable to swallow, would likely benefit from      palliative care consult for further discussions with his family and      transition to end of life care as the  patient does not wish feeding      tubes placed or any other means of nutritional support.  Would      consider further head imaging but unlikely that this could greatly      change our management if if the patient does not want any      aggressive interventions.  2. Urinary tract infection.  We will treat with ceftriaxone and      followup urine culture.  Continue suprapubic bladder catheter.  3. Poor nutrition.  Continue thiamine intravenous.  4. Mild dehydration.  For now we will continue intravenous fluids but      unlikely this is not a long-term solution.  5. Prophylaxis:  Protonix and Lovenox.   Code status:  The patient is do not resuscitate, do not intervene.      Michiel Cowboy, MD  Electronically Signed     AVD/MEDQ  D:  09/14/2008  T:  09/14/2008  Job:  [id number]   cc:   Gretta Arab. Valentina Lucks, M.D.  Fax: [phone]

## 2011-02-28 ENCOUNTER — Non-Acute Institutional Stay: Payer: Self-pay | Admitting: Family Medicine

## 2011-03-02 NOTE — H&P (Signed)
[name], [name]                        ACCOUNT NO.:  [id number]   MEDICAL RECORD NO.:  [id number]                   PATIENT TYPE:  INP   LOCATION:  2307                                 FACILITY:  [facility]   PHYSICIAN:  Shan Levans, M.D. [facility]            DATE OF BIRTH:  1951-01-14   DATE OF ADMISSION:  10/03/2003  DATE OF DISCHARGE:                                HISTORY & PHYSICAL   CHIEF COMPLAINT:  Respiratory failure, septic shock, pneumonia.   HISTORY OF PRESENT ILLNESS:  A 60 year old African-American male, history of  multiple sclerosis.  He has been bed-bound now for nearly 20 years.  His  family takes care of him but he is able to take his meds orally and swallow  and take a diet orally, but he essentially needs to be moved from bed to  chair and chair to wheelchair in the home environment.  He was last admitted  in October for the first time with pneumonia which was an uneventful  admission.  This time he notes one to two day onset of  cough with sputum  production, low-grade fever, increasing dyspnea today, no nausea or vomiting  but did have emesis in the ER with questionable aspiration.  Following this,  he had more hypoxia and apneic spells and required intubation in the  emergency department.  He was responsive initially when brought in to the  emergency room.  Fever has been very high at home.  Denies any abdominal  pain.  The patient is now on a vent in a shock state.  We are asked to  assess and admit the patient for critical care.  Note is made of the fact  that there is no beds at [facility] and will be transporting him  to [facility] to the ICU room 2307.   PAST MEDICAL HISTORY:   MEDICAL HISTORY:  Multiple sclerosis.  He is nonambulatory.  Has had this  for 20 years.   MEDICATIONS:  Currently none.   PAST SURGICAL HISTORY:  None.   ALLERGIES:  None.   SOCIAL HISTORY:  He lives with his mother.  Is divorced.  Does not smoke or  drink.   Has one daughter.  Is not ambulatory.  He has difficulty with his  speech because of his MS and the MS is in its advanced stages.   PHYSICAL EXAMINATION:  VITAL SIGNS:  Blood pressure initially 138/95, now is  80/20.  Temp 97, pulse 120, respirations 20.  The patient is now on the  vent, orally intubated.  GENERAL:  Initially was awake, now is unresponsive on the vent.  HEENT:  Oropharynx;  Endotracheal tube in place, previous to this was clear.  NECK:  Supple.  CHEST:  Rales and rhonchi at the bases, expiratory wheezes noted.  CARDIOVASCULAR:  Tachycardia, normal S1 & S2.  No murmur.  ABDOMEN:  Soft, nontender, bowel sounds were active.  EXTREMITIES:  No edema or clubbing.  Semi-rigid musculature noted.  Muscle  wasting noted.   LABORATORY AND ACCESSORY DATA:  White count was 11.3, hemoglobin 16,  platelet count 161.  Sodium 143, potassium 3.9, chloride 114, CO2 26, BUN 9,  creatinine 1, blood sugar 99.  Liver function in unremarkable.   Chest x-ray right middle lobe and lower lobe infiltrate.  EKG sinus rhythm.  No ST-T wave changes.   IMPRESSION:  This is a 60 year old male with advanced multiple sclerosis now  with severe sepsis with septic shock, right lower lobe pneumonia likely  aspiration in etiology versus community-acquired pneumonia and volume  depletion.   RECOMMENDATIONS:  Place on full vent support, give high-dose IV fluid volume  apply the surviving sepsis protocol.  Check cortisol, lactate, and SVO2  levels.  Consider Xigris, give IV cefepime, IV Zithromax.  Consider pressors  if necessary.  Place central line.  Monitor central venous pressures.  Admit  to the ICU.                                               Shan Levans, M.D. [facility]    PW/MEDQ  D:  10/03/2003  T:  10/03/2003  Job:  [id number]   cc:   Gretta Arab. Valentina Lucks, M.D.  [address] [place]  [place] [address]  Fax: [phone]

## 2011-03-02 NOTE — Discharge Summary (Signed)
NAMEELWARD, NOCERA                        ACCOUNT NO.:  [id number]   MEDICAL RECORD NO.:  [id number]                   PATIENT TYPE:  INP   LOCATION:  5709                                 FACILITY:  [facility]   PHYSICIAN:  Deirdre Peer. Polite, M.D.              DATE OF BIRTH:  [birth year]   DATE OF ADMISSION:  10/03/2003  DATE OF DISCHARGE:                                 DISCHARGE SUMMARY   ADDENDUM   Please send a copy of job [phone] to the patient's primary M.D., Dr. Maurice Small of [facility].                                                Deirdre Peer. Polite, M.D.    RDP/MEDQ  D:  10/11/2003  T:  10/11/2003  Job:  [id number]

## 2011-03-02 NOTE — H&P (Signed)
NAME:  [name], [name]                        ACCOUNT NO.:  [id number]   MEDICAL RECORD NO.:  [id number]                   PATIENT TYPE:  INP   LOCATION:  0440                                 FACILITY:  [facility]   PHYSICIAN:  Sherin Quarry, MD                   DATE OF BIRTH:  1951-04-21   DATE OF ADMISSION:  07/17/2003  DATE OF DISCHARGE:                                HISTORY & PHYSICAL   HISTORY OF PRESENT ILLNESS:  Since his last discharge on April 22, 2003, Mr.  [name] reportedly has been doing well.  He is cared for at home by his  mother along with a caregiver that helps to bathe him and dress him.  Normally, he is able to participate in feeding himself, although he is  confined to bed and is incontinent of bowels and bladder.  Today, his mother  noticed that he had developed increased chest congestion and fever.  Also,  he had stopped talking which he recognizes is a sign of impending infection.  She therefore brought him to the [facility] Emergency Room.  Although his  chest x-ray does not suggest that he has an evident pneumonia, he is  admitted at this time due to overall deterioration in his mental status and  assumed bronchitis with aspiration.   MEDICATIONS:  He currently taking no medications regularly.   ALLERGIES:  No known drug allergies.   PAST SURGICAL HISTORY:  He has had no operations.   ILLNESSES:  The patient's multiple sclerosis is quite advanced.  He is  confined to bed secondary to advanced multiple sclerosis.   FAMILY HISTORY:  The patient's father died of coronary artery disease and  peripheral vascular disease in August, which has contributed to his mother's  overall stress.   SOCIAL HISTORY:  See above.   REVIEW OF SYSTEMS:  HEAD:  There has been no headache or dizziness.  EYES:  There has been no visual blurring or diplopia.  EARS, NOSE, AND THROAT:  There has been no complaint of earache, sinus pain, or sore throat.  CHEST:  See above.   CARDIOVASCULAR:  There is no history of chest pain, orthopnea,  PND, or lymphedema.  GASTROINTESTINAL:  He has not been vomiting.  There  have been no bowel problems.  GENITOURINARY:  There has been no dysuria or  urinary frequency.  NEUROLOGIC:  See above.  ENDOCRINE:  There is no history  of excessive thirst, urinary frequency, heat or cold intolerance.   PHYSICAL EXAMINATION:  GENERAL:  He is an alert gentleman who is not able to  speak.  He is able to follow commands.  He appears somewhat lethargic.  HEENT:  Within normal limits.  CHEST:  Remarkable for diffuse rhonchi, left greater than right, there is  mild wheezing.  CARDIOVASCULAR:  Normal S1 and S2.  There are no murmurs, rubs, or gallops.  ABDOMEN:  Benign.  There are normal bowel sounds without masses, tenderness,  or organomegaly.  NEUROLOGIC:  Within normal limits.  EXTREMITIES:  Within normal limits.   IMPRESSION:  1. Possible very early aspiration pneumonia versus bronchitis.  2. Fever with secondary deterioration of multiple sclerosis symptoms.  3. Possible altered mental status.  4. Advanced multiple sclerosis.   PLAN:  The patient will be admitted for observation and treatment.  We will  administer intravenous fluids, Tylenol to control fever, nebulizer  treatments, and antibiotics.  The patient will be monitored closely in the  inpatient setting.                                               Sherin Quarry, MD    SY/MEDQ  D:  07/18/2003  T:  07/18/2003  Job:  [id number]   cc:   Gretta Arab. Valentina Lucks, M.D.  [address] [place]  [place] [address]  Fax: [phone]

## 2011-03-02 NOTE — Discharge Summary (Signed)
[name], [name]              ACCOUNT NO.:  [id number]   MEDICAL RECORD NO.:  [id number]          PATIENT TYPE:  INP   LOCATION:  5710                         FACILITY:  [facility]   PHYSICIAN:  Jackie Plum, M.D.DATE OF BIRTH:  [birth year]   DATE OF ADMISSION:  04/23/2005  DATE OF DISCHARGE:                                 DISCHARGE SUMMARY   DISCHARGE DIAGNOSES:  1.  Acute bronchitis, resolving.  2.  Weakness secondary to acute bronchitis, resolved.  3.  Dehydration, resolved.  4.  Multiple sclerosis.   DISCHARGE MEDICATIONS:  The patient is going to resume his previous  medication which is multivitamins only. New medicines:  Avelox 400 mg p.o.  daily for 5 days to complete a total of 7 days of treatment.   REASON FOR ADMISSION:  Generalized weakness. The patient presented with  generalized weakness and was seen by Dr. Rito Ehrlich on April 23, 2005. He had  some mild shortness of breath. External exam was negative for any acute  infiltrate but he had some adventitious lung sounds. Presumptive diagnosis  of bronchitis was made. The patient was started on IV antibiotics as well as  IV fluid supplementation and supplemental oxygen. He did well. His symptoms  improved significantly. He was switched to p.o. antibiotics yesterday, has  been afebrile overnight, and his vital signs have remained stable with pulse  oximetry more than 92% on room air. Today, he deemed appropriate for  discharge with outpatient followup with PCP.   CONSULTS:  Not applicable.   PROCEDURE:  Not applicable.   CONDITION ON DISCHARGE:  Improved and satisfactory.   DISCHARGE LABORATORY DATA:  Sodium 143, potassium 3.8, chloride 111, CO2 24,  glucose 101, BUN 7, creatinine 0.9, calcium 8.4.  WBC count 7.6, hemoglobin  13.4, hematocrit 40.4, MCV 87.7, platelet count 127.   The patient has mild thrombocytopenia likely secondary to his infection.  Will need outpatient followup. He does not have any petechiae  or any  evidence of bleeding.       GO/MEDQ  D:  04/26/2005  T:  04/26/2005  Job:  [id number]

## 2011-03-02 NOTE — Discharge Summary (Signed)
[name], [name]                        ACCOUNT NO.:  [id number]   MEDICAL RECORD NO.:  [id number]                   PATIENT TYPE:  INP   LOCATION:  5709                                 FACILITY:  [facility]   PHYSICIAN:  Deirdre Peer. Polite, M.D.              DATE OF BIRTH:  29-Oct-1950   DATE OF ADMISSION:  10/03/2003  DATE OF DISCHARGE:  10/11/2003                                 DISCHARGE SUMMARY   DISCHARGE DIAGNOSES:  1. Status post ventilator dependent respiratory failure, resolved, secondary     to aspiration pneumonia.  2. Aspiration pneumonia, recurrent in nature.  3. Multiple sclerosis, end-stage.  4. ___________.   DISCHARGE MEDICATIONS:  1. Augmentin 875 mg, one p.o. b.i.d. x10 more days.  2. Albuterol 2.5 mg nebulizers q.6h.   DISPOSITION:  The patient will be discharged to home with followup care  given primarily by his family.   STUDIES:  1. Blood cultures, no growth x5 days.  2. Urine culture, no growth.  3. Chest x-ray December 26, showed mild, segmental atelectasis.  4. Chest x-ray December 21, right lower-lobe opacities, consolidation.  5. Modified barium swallow showed mild-to-moderate dysphagia, and showed     laryngeal penetration with thin liquids.   RECOMMENDATION:  Dysphagia III diet with nectar-thick liquids.  Followup  speech evaluation on December 27.  There were no signs of aspiration from  thick liquids.  It was recommended continued current diet, and to recommend  followup modified barium swallow to assess for possible upgrade of diet.  Therefore, an outpatient modified barium swallow was recommended in two  weeks.  Number [phone] to schedule as an outpatient.   HISTORY OF PRESENT ILLNESS:  The patient is a 60 year old male with known  history of advanced MS, who presented to the ED on December 19 in acute  respiratory distress which required immediate ventilatory support secondary  to aspiration pneumonia.  The patient was admitted to the ICU  and initially  cared for under the service of Pulmonary Critical Care; and upon extubation,  transferred to [facility].   Please see the dictated H&P and initial consultation for further details of  history of present illness.   PAST MEDICAL HISTORY:  1. As stated above, multiple sclerosis, end-stage.  2. Recurrent aspiration pneumonia.   MEDICATIONS ON ADMISSION:  Occasional multivitamins.   ALLERGIES:  No known drug allergies.   HOSPITAL COURSE:  The patient is admitted to Medical ICU for evaluation and  treatment of ventilatory dependent respiratory failure secondary to  aspiration pneumonia.  The patient was treated with broad-spectrum  antibiotics, aggressive pulmonary toilet, and frequent nebulization  treatments.  Upon extubation, the patient was transferred out of the ICU and  transferred to the service of [facility].  There the patient  continued to receive IV antibiotics which were ultimately changed to p.o.  Augmentin.  The patient continued with frequent nebulization treatments and  chest PT.  In the interim, he was being seen by speech.  The patient was  initially on tube feedings and was ultimately given a trial of dysphagia  III, solids and nectar-thick liquids.  The patient tolerated this well  without signs of aspiration.  The patient was reevaluated again on December  27, and continued to do well.  As the patient was about to be discharged, it  was recommended the patient have an outpatient followup study, i.e. modified  barium swallow.  This test can be arranged by calling [phone].   As for the patient's pneumonia, the patient was converted to oral  antibiotics.  He was monitored in the hospital another 48 hours and was  without event.  The patient's temperature ranged from 98.7 to 99.2.  Followup chest x-ray only showed subsegmental atelectasis.  The patient was  tolerant of his dysphagia III diet.   At this time, it was felt that the  patient was at baseline.  He is on all  oral medications, and he is stable for discharge.  The patient's mother has  been instructed by the nursing staff how to prepare a dysphagia III diet  with nectar thick liquids.  We will recommend that he have a nebulizer  machine at home so that he can have q.6 nebulizer treatments and also  recommend that he has a followup modified barium swallow in two weeks by  calling the speech department at [phone].                                                Deirdre Peer. Polite, M.D.    RDP/MEDQ  D:  10/11/2003  T:  10/11/2003  Job:  [id number]   cc:   Gretta Arab. Valentina Lucks, M.D.  [address] [place]  [place] [address]  Fax: [phone]

## 2011-03-02 NOTE — H&P (Signed)
[facility]  Patient:    [name], [name]                     MRN: [id number] Adm. Date:  [id number] Attending:  Edmund [name]                         History and Physical  CHIEF COMPLAINT:  Fever.  HISTORY OF PRESENT ILLNESS:  History is from parents.  [name] is a 59 year old black male with known diagnosis of progressive MS.  He was admitted here to the hospital in September of last year with pneumonia. He as admitted by [name] as an unassigned. He had one follow up, none since. Not sure that he has really made full established as a patient.  They just did not even request a personal physician at that time, but nonetheless was brought to the emergency room today with a history of having a fever starting at about 3:00.  He has no symptoms, which he usually does not. Mom says normally when he gets a fever, he gets quiet. He does not speak as he normally would.  He has been bed ridden and that has been no different. He has had fevers before in which he stayed at home. Never really has called the office. The only reason they came today is because the nurses aid that came by said that they should take him.  He sees a [name] in [place] maybe once a year who is a neurologist.  He is no medicines. He has been bed ridden for about 15 years but no problems with decubitus. No chronic meds.  Mom and dad take care of him. He will feed himself for breakfast and lunch but they usually feed him at night time. He uses a diaper. He was seen by the ER physician and found to be tachycardic. Fever 102 and not much else at that point but felt like admission would be warranted to rule out sepsis given his compromised condition.  PAST MEDICAL HISTORY:  Tonsillectomy and adenoidectomy as a child. MS as noted.  FAMILY HISTORY:  Noncontributory.  PHYSICAL EXAMINATION:  VITAL SIGNS:  Blood pressure 138/86, pulse 120 and regular, respirations  30 unlabored, temperature 102.1.  O2 saturation 95% on room air.  HEENT:  TMs clear.  Moist membranes.  NECK: Supple.  No adenopathy.  CHEST: Clear but breath sounds diminished with diminished excursion.  CORONARY:  Sinus tachycardia.  ABDOMEN: Normal bowel sounds.  Soft and nontender.  No masses or organomegaly.  EXTREMITIES:  No decubitus.  There is some muscle wasting. Pulses intact.  NEUROLOGIC:  He did follow simple commands. Made some grunting noises but did not really converse.  LABORATORY DATA:  Chest x-ray per ER physician was without infiltrate. WBC 15,000.  Urinalysis was unremarkable.  ASSESSMENT: 1. Fever, rule out sepsis. 2. Multiple sclerosis, progressive.  PLAN:  Cultures pending. IV fluids. Antibiotics pending cultures. Return to home. Parents are not interested in placement and does not seem that it is necessary at this point. DD:  03/08/00 TD:  03/08/00 Job: [id number]

## 2011-03-02 NOTE — Consult Note (Signed)
[name], [name]                        ACCOUNT NO.:  [id number]   MEDICAL RECORD NO.:  [id number]                   PATIENT TYPE:  INP   LOCATION:  5709                                 FACILITY:  [facility]   PHYSICIAN:  Deirdre Peer. Polite, M.D.              DATE OF BIRTH:  1951/01/10   DATE OF CONSULTATION:  10/06/2003  DATE OF DISCHARGE:                                   CONSULTATION   REPORT TITLE:  INTERNAL MEDICINE CONSULTATION   CONSULTING PHYSICIAN:  Deirdre Peer. Polite, M.D.   REFERRING PHYSICIAN:  Charlcie Cradle. Delford Field, M.D. [facility]   CHIEF COMPLAINT:  Status post ventilator-dependent respiratory failure  secondary to aspiration pneumonia.   HISTORY OF PRESENT ILLNESS:  [name] is a 60 year old male with a known  history of advanced MS, who is essentially bedridden, who is hospitalized,  returning for aspiration pneumonia.  The patient was admitted on October 03, 2003, secondary to acute onset of respiratory distress.  At that time, a  chest x-ray showed right middle lobe infiltrate and exam consistent with  respiratory distress, systolics in the 90s, and low O2 saturations.  The  patient required immediate ventilatory support and admission to ICU.  The  patient was under the care of Dr. Danise Mina and required ventilatory  support until December 20 and was subsequently transferred out of the ICU.  Upon arrival to the medicine floor, [facility] was asked to consult and  to manage the patient.   At this time, the patient is alert and oriented, appears back to baseline.  Speech is very soft but appropriate and intelligible.  He denies any fever  or chills, denies any chest pain but does have some shortness of breath and  a wet cough.  Denies any abdominal pain, no nausea, no vomiting.  He is  tolerating NG tube feedings without difficulty.  The patient has undergone  speech evaluation which suggested the patient should be adequate for a  pureed diet with thin liquids;  however, we will pursue a MBS to rule out  silent signs of aspiration.  The patient currently is on nebulizer  treatments, IV antibiotics, Zosyn, and other p.r.n. medications.  At this  time, [facility] will assume care of patient as requested by  Pulmonary Critical Care.   PAST MEDICAL HISTORY:  1. As stated above, multiple sclerosis, end-stage.  The patient is currently     bedridden.  2. History of recurrent aspiration pneumonia.   ADMISSION MEDICATIONS:  Multivitamins.   ALLERGIES:  No known drug allergies.   PAST SURGICAL HISTORY:  None.   SOCIAL HISTORY:  Negative for tobacco, alcohol, or drugs.   FAMILY HISTORY:  Noncontributory.   PHYSICAL EXAMINATION:  GENERAL:  The patient is alert and oriented, in no  acute distress.  VITAL SIGNS:  T-max 100.7, temperature current 99.3, BP 128/60, respiratory  rate 20, saturating 98-99% on room air.  HEENT:  Pupils equal, round, reactive to light, anicteric sclerae.  No oral  lesions.  CHEST:  Decreased breath sounds in the right base.  Otherwise, moderate air  movement with occasional rhonchi.  CARDIOVASCULAR:  Regular S1, S2.  No S3.  ABDOMEN:  Soft, positive bowel sounds; no hepatosplenomegaly is appreciated.  EXTREMITIES:  Generally contracted at current.  Has end-stage MS.  NEUROLOGIC:  At baseline.  Cranial nerves II-XII intact, but he has adequate  gag reflex.  Motor:  The patient is contracted secondary to end-stage MS.   LABORATORY DATA:  White count 7.7, hemoglobin 10.9, platelet 120, sodium  142, potassium 3.2, chloride 111, CO2 27, BUN 4, creatinine 0.8, calcium  8.4, magnesium 1.9.  UA on admission:  Specific gravity 10-11, 40 ketones,  LE and nitrate negative, rare bacteria.   Chest x-ray, December 21, right lower lobe opacity/consolidation, decreased  right effusion.  Chest x-ray, December 20, shows feeding tube in the stomach  with tip in the fundus.  Chest x-ray, December 19, shows endotracheal tube  in  good position at mid tracheal level, left lower lobe infiltrate, right  basilar atelectasis, and right pleural effusion.   ASSESSMENT/PLAN:  Aspiration pneumonia in a patient who is status post  ventilator-dependent respiratory failure, extubated on December 21.  The  patient will continue current antibiotics, intravenous Zosyn, will continue  nothing by mouth with tube feed until modified barium swallow.  We will  continue aggressive pulmonary toilet.  The patient will also continue with  Protonix and deep venous thrombosis prophylaxis.  We will make further  recommendations after the patient has the modified barium swallow as to the  possible need for a percutaneous endoscopic gastrostomy tube for his  recurrent aspiration pneumonia.  This will be discussed further with the  patient and his mother.  At this time, he is very resistant to that.                                               Deirdre Peer. Polite, M.D.    RDP/MEDQ  D:  10/06/2003  T:  10/07/2003  Job:  [id number]   cc:   Shan Levans, M.D. [facility]   Maurice Small, M.D.

## 2011-03-02 NOTE — Discharge Summary (Signed)
[facility]  Patient:    [name], [name]                     MRN: [id number] Adm. Date:  [id number] Disc. Date: [id number] Attending:  Rosanne Sack CC:         Gretta Arab. Valentina Lucks, M.D.   Discharge Summary  DISCHARGE DIAGNOSES: 1. Left lower lobe pneumonia, aspiration type, with septicemia.    a.  Hypoxemia, resolved.    b.  Negative chest computed tomography scan for pulmonary emboli. 2. History of hematuria. 3. Severe end-stage multiple sclerosis.    a. Bed ridden for 15 years.    b. Chronic urinary incontinence.    c. Chronic constipation.    d. Paraplegia, severe weakness in the upper extremities. 4. Transient mild dehydration. 5. Transient delirium secondary to #1. 6. Portal catheter. 7. Do not resuscitate status.  DISCHARGE MEDICATIONS: 1. Augmentin 875 mg p.o. b.i.d. x 6 more days. 2. Humibid LA two tablets b.i.d. x 6 days. 3. Nasonex nasal spray two puffs to each nostril b.i.d. x 6 days. 4. Combivent MDI two puffs three times a day x 6 days. 5. Laxative of choice.  DISPOSITION:  Discharged home to the care of his family.  [facility] will continue following this patient.  A personal aide and home health nurse was arranged prior to discharge.  During this admission, the patient was evaluated by hospice.  The family was now ready for this service at this point.  A transitional team from [facility] could not be arranged because the patients current medical insurance does not have any arrangements with [facility].  The patient was DNR during this hospital stay.  FOLLOWUP:  Dr. Maurice Small, of [facility], will follow Mr. Cleckler within three to five days.  We recommended followup chest x-ray in two weeks to confirm the total resolution of this pneumonia.  CONSULTATION:  [facility], Dr. Clyde Lundborg.  PROCEDURES: 1. Chest computed tomography scan negative for pulmonary  emboli. 2. Brain computed tomography scan with bifrontal atrophy and no acute    findings.  DISCHARGE LABORATORY DATA AND X-RAY FINDINGS:  Blood culture negative (final results).  Chest x-ray consistent with a left lower lobe infiltrate, improved. Sputum culture consistent with yeast (colonizer).  Hemoglobin 13.0, MCV 86, WBC 5.8, platelets 110.  Urinalysis with no pyuria and hematuria present with rbcs 21-50.  Urine culture with insignificant growth.  H. flu antigen negative.  CMV IgG antibodies 3.12 (positive range), CMV IgM 0.3 (negative range).  Epstein-Barr virus antibodies 9.93 (positive), Epstein-Barr virus IgM 0.04 (negative).  HOSPITAL COURSE:  [name] is a very pleasant 60 year old man with end-stage multiple sclerosis, almost total paraplegia, who presented with fever and obtunded mental status.  Please see admission H&P by Dr. Sheppard Penton, for further details, regarding the history of presentation, physical exam and laboratory data.  #1 - LEFT LOWER LOBE PNEUMONIA, PROBABLE ASPIRATION TYPE WITH SEPTICEMIA:  The patient presented with altered mental status, severe hypoxemia and fever.  The initial ABG was pH 7.45, pCO2 33 and pO2 66 on room air.  A chest CT scan was obtained on admission to rule out pulmonary emboli.  This imaging study was negative.  The patient was started on Levaquin for possible community-acquired pneumonia.  Due to his lethargy, a head CT scan was obtained to rule out acute findings.  His fever on day #2 was 103.4.  The CAT scan obtained on  the second day showed bifrontal atrophy with no acute intracranial abnormalities noticed. There was no evidence of acute sinusitis.  On IV Tequin, the patient defervesced.  On December 15, 2000, Tequin was switched to tablets.  The following day, on December 16, 2000, the patient spiked a high fever of 104.9 associated with lethargy again.  At this point, concerns about viral infectious process was considered.  Influenza  antibodies were negative.  CMV and Epstein-Barr virus antigens were consistent with negative acute infection.  IgG antibodies were noticed which are consistent with exposure to these two viruses in the past.  The patient was placed on vancomycin and Zosyn as etiologies like endocarditis or infection of the Port-A-Cath were considered.  The chest x-ray showed probable bibasilar infiltrates.  On followup chest x-ray, left lower lobe infiltrate was confirmed.  We felt that this patient was likely to have an aspiration pneumonia effecting the left lower lobe.  Vancomycin was discontinued on December 17, 2000.  The patients fever continued to improve.  The following day, December 18, 2000, the patient was afebrile.  Given the severity of the neurological symptoms with end-stage multiple sclerosis, we decided to continue on Zosyn for one more day.  We switched Zosyn to Augmentin on December 20, 2000.  The patient remained afebrile on oral antibiotic therapy.  On December 21, 2000, the patient was afebrile and hemodynamically stable.  The lung exam showed improved rales at the left base.  The mental status was back to baseline.  By the time of discharge, the patients medical condition was determined improved.  #2 - END-STAGE MULTIPLE SCLEROSIS:  During this admission, code status was addressed.  The patient was made a DNR after explaining the meaning of this status to the family and patient.  Hospice was consulted.  The family was now felt ready for hospice and Dr. Clyde Lundborg talked to them personally.  A transfusional team with [facility] was suggested.  The patients medical insurance did not have any arrangements with [facility].  For this reason, [facility] was used for home health nurse and personal aide.  CONDITION ON DISCHARGE:  Improved. DD:  12/21/00 TD:  12/23/00 Job: [id number]

## 2011-03-02 NOTE — H&P (Signed)
[name], BLESS NO.:  [id number]   MEDICAL RECORD NO.:  [id number]          PATIENT TYPE:  INP   LOCATION:  5707                         FACILITY:  [facility]   PHYSICIAN:  Hollice Espy, M.D.DATE OF BIRTH:  Jul 21, 1951   DATE OF ADMISSION:  04/23/2005  DATE OF DISCHARGE:                                HISTORY & PHYSICAL   PRIMARY CARE PHYSICIAN:  Gretta Arab. Valentina Lucks, M.D.   CHIEF COMPLAINT:  Weakness.   HISTORY OF PRESENT ILLNESS:  The patient is a 60 year old African American  male with past medical history of MS x15 years as well as occasional  episodes of pneumonia who presents to the hospital with weakness.  Apparently, this has been going on for the last several days, worsening to  the point where the patient is barely able to talk and feed himself. He has  been complaining of some mild shortness of breath but felt better after he  was put on O2 here in the emergency room. He has denied any pain. He is very  limited in giving me a review of systems and history secondary to his  weakness and mumbling speech but his family is able to understand what he is  saying and they tell me that he normally does relatively well. He is on no  medicines and know that he usually has an infection when his MS starts  acting up and he gets very weak.   The patient came in to the emergency room. In the ER, he was noted to have a  normal white count; however, he did have a 82% shift. The rest of his lab  work noted greater than 80 ketones, creatinine of 1.3, slight tachycardic  with a heart rate around 105 and a temperature maximum of 102.7.   The patient states he is feeling better. He denies any chest pain. I am not  able to get any more review of systems.   PAST MEDICAL HISTORY:  MS x15 years.   ALLERGIES:  He has no known drug allergies.   MEDICATIONS:  He is on multivitamins only.   SOCIAL HISTORY:  He denies any tobacco, alcohol, or drug use. He is cared  for  by his daughter as well as his mother.   FAMILY HISTORY:  Noncontributory.   PHYSICAL EXAMINATION:  VITAL SIGNS:  Temperature 102.7, heart rate 105,  blood pressure 163/99, respirations 20. O2 saturation 99% on two liters.  GENERAL:  The patient is alert and oriented x3 in no acute distress.  HEENT:  Normocephalic and atraumatic. He has one wandering eye. Mucus  membranes are dry. He has no carotid bruits.  HEART:  A regular rhythm but tachycardic.  LUNGS:  It is hard to take a full deep inspiratory effort, although they  otherwise appear to be clear but decreased breath sounds at the bases.  ABDOMEN:  Soft, nontender, and nondistended. Positive bowel sounds.  EXTREMITIES:  No clubbing or cyanosis but his legs are emaciated.   LABORATORY DATA:  White count 8.5 with a 82% shift. H&H 15.4, 46.7, MCV of  87. Platelet count  of 145,000. Urinalysis is noted to only have a greater  than 80 of ketones. LFTs are unremarkable. Sodium 142, potassium 3.8,  chloride 109, bicarbonate is 25, BUN 10, creatinine 1.3, glucose 83.   Chest x-ray shows no acute abnormalities.   ASSESSMENT/PLAN:  1.  Weakness:  Given the patient's fever, mild shortness of breath, and      tachycardia, likely I suspect that he has an upper respiratory      infection, possibly an early pneumonia although nothing is clinically      apparent on the chest x-ray which may be because of dehydration. We will      start the patient on intravenous Avelox 400 mg daily as well.  2.  The patient appears to be clinically dehydrated: We will also put him on      intravenous fluids.  3.  Multiple sclerosis, which appears to be stable:  The patient is not on      any medications for this. He is not appearing to have any acute/severe      exacerbations. If his weakness persists following treatment of infection      and treatment of dehydration, we will discuss with neurology.      Hollice Espy, M.D.  Electronically  Signed     SKK/MEDQ  D:  04/23/2005  T:  04/24/2005  Job:  [id number]   cc:   Gretta Arab. Valentina Lucks, M.D.  Fax: [phone]

## 2011-03-02 NOTE — Discharge Summary (Signed)
[facility]  Patient:    [name], [name]                       MRN: [id number] Adm. Date:  03/08/00 Disc. Date: 03/09/00 Attending:  Thora Lance, M.D. CC:         Gretta Arab. Valentina Lucks, M.D.             Rosanne Sack, M.D.                           Discharge Summary  REASON FOR ADMISSION:  This is a 60 year old black male with history of progressive multiple sclerosis who, at three oclock on the day of admission, developed a fever.  There was a change in mental status.  He has history of pneumonia approximately nine months ago.  He was admitted for workup and treatment.  SIGNIFICANT FINDINGS:  Vital signs revealed blood pressure 138/86, heart rate 120, respirations 20, temperature 102.1, oxygen saturation 95% on room air. The rest of his examination was unremarkable.  LABORATORY DATA:  CBC showed white blood cell count 14.9, hemoglobin 16.1, platelet count 179.  BMET revealed sodium 137, potassium 3.7, chloride 109, bicarbonate 23, BUN 9, creatinine 1.1, glucose 99, calcium 9.4.  Urinalysis unremarkable, including 0-5 wbcs.  Chest x-ray showed no infiltrate or acute process.  HOSPITAL COURSE: FEVER:  The patient was admitted with a fever.  He was given one dose of Rocephin and then started on Levaquin 500 mg a day.  He defervesced.  His mental status returned to baseline.  There were no localizing signs of an infection.  Urine culture grew 4000 colonies, insignificant growth.  Blood cultures were no growth after one day.  The patient was discharged in good condition on May 26 on three more days of Levaquin.  DISCHARGE DIAGNOSES: 1. Fever. 2. Multiple sclerosis.  PROCEDURES:  None.  DISCHARGE MEDICATIONS:  Levaquin 250 mg p.o. q.d. for three days.  DIET:  Regular.  ACTIVITY:  Bedrest.  FOLLOW-UP:  June 1, Dr. Maurice Small. DD:  03/09/00 TD:  03/11/00 Job: [id number]

## 2011-03-02 NOTE — Discharge Summary (Signed)
[name], [name]                        ACCOUNT NO.:  [id number]   MEDICAL RECORD NO.:  [id number]                   PATIENT TYPE:  INP   LOCATION:  0440                                 FACILITY:  [facility]   PHYSICIAN:  Deirdre Peer. Polite, M.D.              DATE OF BIRTH:  1950-12-04   DATE OF ADMISSION:  07/17/2003  DATE OF DISCHARGE:  07/22/2003                                 DISCHARGE SUMMARY   PRIMARY CARE PHYSICIAN:  Gretta Arab. Valentina Lucks, M.D.   DISCHARGE DIAGNOSES:  1. Aspiration pneumonia, improved.  Home on antibiotic therapy.  2. Constipation, resolved.  3. Multiple sclerosis, stable.   DISCHARGE MEDICATIONS:  Augmentin 500 mg p.o. b.i.d. x4 days.   ALLERGIES:  No known drug allergies.   PROCEDURES:  None.   HISTORY OF PRESENT ILLNESS:  This is a gentleman who has well established  MS.  He was normally cared for at home by his mother and a caregiver.  He  reportedly was in his usual state of health until the day of presentation  when his mother noticed that he had developed increased chest congestion and  fever and he had also stopped talking which the family recognized this as a  sign of impending infection.  Mother brought the patient to the ER at [facility].  A chest x-ray did not clearly show evident pneumonia at time of  admission; however, he was admitted for overall deterioration and smell  status and assumed bronchitis with aspiration.   HOSPITAL COURSE:  The patient was admitted to a regular medical bed and  provided with IV hydration, IV antibiotics, antipyretics, and nebulizers.  Repeat chest x-rays did reveal right infrahilar infiltrate/pneumonia as well  as a questionable left lower lobe pneumonia with consolidation.  The patient  was changed over to p.o. antibiotics 24 hours prior to discharge and  remained afebrile throughout that period.  At the time of discharge his  mental status has returned to baseline.  He is verbally communicative and  without any signs or symptoms of respiratory distress.  As noted he is  afebrile and his vital signs are stable.   The patient did complain of constipation on his third day in the hospital.  This was treated with laxatives with good results.  His abdominal exam is  normal at discharge.   The patient's multiple sclerosis remained stable throughout this admission.  He did not require any interventions for this.   LABORATORY DATA:  Discharge labs:  Sodium 144, potassium 3.4, glucose 101,  BUN 5, creatinine 0.9.  White blood cell count 8.2, hemoglobin 12.3,  hematocrit 36.3, platelet count 155,000.  UA was negative for infection or  glucose greater than 80 mg/dl.  Ketones were noted; however, the patient was  somewhat dehydrated on presentation.   CONSULTATIONS:  None.   DISPOSITION:  Discharged to home in care of mother and usual caregiver.   FOLLOW  UP:  The patient is encouraged to follow up with Dr. Valentina Lucks as  needed and certainly to return to the emergency room if his symptoms return  or he appears to not be getting better in a timely fashion.         Ellender Hose. Virl Son. Polite, M.D.    SMD/MEDQ  D:  07/22/2003  T:  07/22/2003  Job:  [id number]   cc:   Gretta Arab. Valentina Lucks, M.D.  [address] [place]  [place] [address]  Fax: [phone]

## 2011-03-02 NOTE — Discharge Summary (Signed)
[name], SALM                        ACCOUNT NO.:  [id number]   MEDICAL RECORD NO.:  [id number]                   PATIENT TYPE:  INP   LOCATION:  0382                                 FACILITY:  [facility]   PHYSICIAN:  Deirdre Peer. Polite, M.D.              DATE OF BIRTH:  [birth year]   DATE OF ADMISSION:  04/19/2003  DATE OF DISCHARGE:                                 DISCHARGE SUMMARY   DISCHARGE DIAGNOSES:  1. Left lower lobe pneumonia with associated hypoxia, resolved at time of     discharge.  2. Fever secondary to #1.  3. Acute mental status change characterized by difficulty speaking, resolved     at time of discharge.  Typical for patient during presentation of     infectious process.  4. Advanced multiple sclerosis.   DISCHARGE MEDICATIONS:  1. Albuterol nebulizers q.6h.  2. Augmentin 875 mg one p.o. b.i.d.  3. Tylenol p.r.n.   DISPOSITION:  The patient is discharged to home in stable condition without  O2 requirement.   STUDIES:  The patient had a urine culture no growth.  Blood cultures  negative.  Sputum culture not representative.  Chest x-ray:  Left lower lobe  infiltrate.  The patient swallowing study without signs of aspiration.   FOLLOWUP:  Primary M.D. April 30, 2003 at 10:20.   HISTORY OF PRESENT ILLNESS:  A 60 year old African-American male with a  known history of advanced multiple sclerosis who presented to the ED with  complaints of fever x3 days, productive cough, however, with difficulty of  coughing out his secretions.  Also, had associated trouble speaking during  this acute illness which has been known to happen in the past.  In addition,  he has had decreased p.o. intake.  In the ED the patient was evaluated,  found to be febrile with a temperature of 101.3, marked leukocytosis, and a  chest x-ray with an infiltrate in the left lower lobe.  Admission was deemed  necessary for further evaluation and treatment.   PAST MEDICAL HISTORY:  1. As  stated above.  2. In addition, advanced MS.  The patient is bed ridden for 15 years.  Is     cared for by his mother.  3. History of left lower lobe pneumonia.   MEDICATIONS ON ADMISSION:  Multivitamin.   ALLERGIES:  No known drug allergies.   SOCIAL HISTORY:  Negative for tobacco, alcohol, or drugs.   FAMILY HISTORY:  Noncontributory.   PHYSICAL EXAMINATION:  VITAL SIGNS:  The patient was febrile, temperature at  101.3, blood pressure 136/84, pulse 120, respiratory rate 22, saturating 96%  on 2 L.  HEENT:  Significant for dry mucous membranes.  NECK:  Supple.  No adenopathy.  LUNGS:  Decreased breath sounds right and left base.  CARDIOVASCULAR:  Tachy.  ABDOMEN:  Nontender.  EXTREMITIES:  No edema.  SKIN:  No wounds.   DATA:  White count  on admission 17.2, hemoglobin 15.  BMET within normal  limits.  Chest x-ray left lower lobe infiltrate.   HOSPITAL COURSE:  Problem 1 - LEFT LOWER LOBE PNEUMONIA, PROBABLE  ASPIRATION:  The patient was admitted to a medicine floor bed, treated with  IV antibiotics in the form of Zosyn.  Given frequent nebulizer treatments  and oxygen as well as IV fluids.  The patient was seen in consultation by  speech to rule out aspiration and the patient was cleared for mechanical  soft diet with thin liquids as there was no overt aspiration noted.  The  patient had improvement in his mental status, his ability to speak and  communicate and his fever defervesced.  According to his mother, within 24  hours he was back to baseline.  The patient had follow-up x-ray which showed  stable left lower lobe infiltrate.  Follow-up CBC showed complete resolution  of leukocytosis with white count of 7.  The patient was asking to go home  within 48 hours of admission, however, I was able to convince him to stay  until his antibiotics were converted to p.o.  The patient's antibiotics were  converted to p.o. and he tolerated without any complications.  At this time   the patient is deemed medically stable for discharge.  He will follow up  with his primary M.D. in approximately one week.  The patient will be  discharged to home with suction catheter to help remove secretions from  posterior pharynx as well as nebulized albuterol.   Problem 2 - SEVERE MULTIPLE SCLEROSIS:  The patient will continue current  outpatient treatment for the above.                                               Deirdre Peer. Polite, M.D.    RDP/MEDQ  D:  04/22/2003  T:  04/22/2003  Job:  [id number]   cc:   Gretta Arab. Valentina Lucks, M.D.  [address] [place]  [place] [address]  Fax: [phone]

## 2011-03-02 NOTE — H&P (Signed)
NAMESAQUAN, [name]                        ACCOUNT NO.:  [id number]   MEDICAL RECORD NO.:  [id number]                   PATIENT TYPE:  INP   LOCATION:  2307                                 FACILITY:  [facility]   PHYSICIAN:  [name], M.D.                 DATE OF BIRTH:  Dec 27, 1950   DATE OF ADMISSION:  10/03/2003  DATE OF DISCHARGE:                                HISTORY & PHYSICAL   PRIMARY CARE PHYSICIAN:  Gretta Arab. Valentina Lucks, M.D. at [facility].   CHIEF COMPLAINT:  Shortness of breath, cough, and some fever.   The patient is a 60 year old African male with history of advanced MS  currently being taken care of his mother.  Most of the history is from the  mother.  The patient limited verbal and not ambulatory.  Mother states he  was doing fine until today.  He started having this cough, congested for the  last two days, and today had high fevers and did not talk much and not  eating and change in mental status.  She brought him to the emergency room.  The patient was initially afebrile and his chest x-ray showed right middle  lobe infiltrate.  White count mildly elevated.  He had pneumonia in October  where he was admitted and treated with antibiotics and did well.  The  patient at the time of the exam, he was communicating and nodding with his  answers.  He denies any chest pain or shortness of breath, denies any nausea  and vomiting or diarrhea, and no headaches.  The patient's condition  deteriorated with his blood pressure dropped down from 120s to 80s and also  it has dropped down from 90s to upper 80s and he started having shallow  respirations.  The patient needed to be intubated and will be admitted to  ICU Critical Care Service for further management.   PAST MEDICAL HISTORY:  MS, advanced, nonambulatory the last 20 to 25 years.  He has seen Dr. Loleta Chance in the past, neurologist in [facility], but not  recently.  He is bed-bound but he is able to eat and do other  activities.   PAST SURGICAL HISTORY:  None.   ALLERGIES:  None.   MEDICATIONS:  Multivitamins.   SOCIAL HISTORY:  He lives with his mother.  He is divorced.  One daughter.  No tobacco, no alcohol, and his speech is slow secondary to MS.   PHYSICAL EXAMINATION:  VITAL SIGNS:  Temperature 97, blood pressure 138/95  initially with pulse of 120, respirations 26 or 28, by the time of the end  of the exam his blood pressure was in systolic 80s/upper 80s with a  nonrebreather.  GENERAL:  He was shaking and chills, minimally responsive.  HEENT:  Oropharynx clear.  LUNGS:  Decreased breath sounds at bases.  CARDIAC:  Tachycardia, S1 & S2 without any murmurs.  ABDOMEN:  Soft  without tenderness.  NEUROLOGICAL:  Nonfocal.   White count 11.3, hemoglobin 16.1.  Chemistries:  Sodium 143, potassium 3.9,  BUN 9, creatinine 1, glucose 99.  LFTs are normal.   Chest X-ray:  Right middle lobe infiltrate.  EKG in sinus tachycardia  without any ST-T wave changes.   IMPRESSION:  60 years old with advanced multiple sclerosis,  nonambulatory.  Came in with hypoxia, fever, mildly elevated white count and  some infiltrate on the x-ray with now hypoxic, hypertension.   PROBLEM LIST:  1. Pneumonia with probably septic shock most likely a community-acquired     versus aspiration.  2. Dehydration.  3. Advanced multiple sclerosis.   PLAN:  Admit to ICU, I discussed with mother.  The patient was intubated for  respiratory support, IV fluids, antibiotics azithromycin and Rocephin,  critical care medicine consult.  The patient's condition is critical and I  discussed with his mom.                                               [name], M.D.    KH/MEDQ  D:  10/03/2003  T:  10/03/2003  Job:  [id number]

## 2011-03-02 NOTE — Discharge Summary (Signed)
NAMEJAYJAY, LITTLES              ACCOUNT NO.:  [id number]   MEDICAL RECORD NO.:  [id number]          PATIENT TYPE:  INP   LOCATION:  1401                         FACILITY:  [facility]   PHYSICIAN:  Corinna L. Lendell Caprice, MDDATE OF BIRTH:  1951-04-12   DATE OF ADMISSION:  04/24/2008  DATE OF DISCHARGE:  04/28/2008                               DISCHARGE SUMMARY   DISCHARGE DIAGNOSES:  1. Ileus.  2. Fecal impaction.  3. Bacteriuria.  4. Multiple sclerosis, bedridden.  5. Chronic anemia.  6. History of decubiti.  7. Dysphasia.  8. Suprapubic catheter.   DISCHARGE MEDICATIONS:  1. Continue Vesicare.  2. Multivitamin.  3. He has been started on MiraLax 17 grams daily.   FOLLOWUP:  Follow up with Dr. Valentina Lucks next week.   CONDITION:  Stable.   DIET:  Pureed.   CONSULTATIONS:  None.   PROCEDURES:  None.   LABORATORY DATA:  Initial white count 13,000 with a normal differential.  White count normalized.  The rest of his CBC was unremarkable.  Complete  metabolic panel significant for an SGPT of 55, albumin of 3.4; otherwise  unremarkable.  Urinalysis showed 7-10 white cells, many yeast, mucus,  negative nitrite, moderate leukocyte esterase, negative blood, negative  protein.  Hemoccult of the stool was negative.  Urine culture grew out  greater than 100,000 colonies of multiple bacterial morphotypes.   SPECIAL STUDIES/RADIOLOGY:  Acute abdominal series on admission showed  distended stomach, generalized ileus and generous stool burden.  Repeat  x-ray on April 26, 2008 showed resolution of ileus.  CT brain showed  cerebral atrophy.   HISTORY AND HOSPITAL COURSE:  Mr. Falco is a 60 year old black male  with advanced multiple sclerosis who presented with abdominal pain,  bloating and nausea.  He was disimpacted by a home health nurse earlier  in the day.  Please see H&P for details.  He had a blood pressure of  160/90, but otherwise normal vital signs.  He had generalized  distention  and tympani.  No tenderness.  Suprapubic catheter in place.  The patient  was admitted, given  IV fluids.  An NG was placed.  The patient received soap suds enema and  laxatives.  His abdominal pain and distention quickly resolved.  At the  time of discharge, he was tolerating a diet and had stable vital signs  and ready for discharge.      Corinna L. Lendell Caprice, MD  Electronically Signed     CLS/MEDQ  D:  06/09/2008  T:  06/10/2008  Job:  [id number]

## 2011-06-20 ENCOUNTER — Non-Acute Institutional Stay: Payer: Self-pay | Admitting: Family Medicine

## 2011-07-03 ENCOUNTER — Inpatient Hospital Stay (HOSPITAL_COMMUNITY)
Admission: EM | Admit: 2011-07-03 | Discharge: 2011-07-05 | DRG: 689 | Disposition: A | Discharge status: Skilled Nursing Facility | Payer: Medicare Other | Attending: Internal Medicine | Admitting: Internal Medicine

## 2011-07-03 ENCOUNTER — Emergency Department (HOSPITAL_COMMUNITY): Payer: Medicare Other

## 2011-07-03 DIAGNOSIS — R5381 Other malaise: Secondary | ICD-10-CM | POA: Diagnosis present

## 2011-07-03 DIAGNOSIS — R64 Cachexia: Secondary | ICD-10-CM | POA: Diagnosis present

## 2011-07-03 DIAGNOSIS — E876 Hypokalemia: Secondary | ICD-10-CM | POA: Diagnosis present

## 2011-07-03 DIAGNOSIS — Z87440 Personal history of urinary (tract) infections: Secondary | ICD-10-CM

## 2011-07-03 DIAGNOSIS — R1310 Dysphagia, unspecified: Secondary | ICD-10-CM | POA: Diagnosis present

## 2011-07-03 DIAGNOSIS — G35 Multiple sclerosis: Secondary | ICD-10-CM | POA: Diagnosis present

## 2011-07-03 DIAGNOSIS — B9689 Other specified bacterial agents as the cause of diseases classified elsewhere: Secondary | ICD-10-CM | POA: Diagnosis present

## 2011-07-03 DIAGNOSIS — I10 Essential (primary) hypertension: Secondary | ICD-10-CM | POA: Diagnosis present

## 2011-07-03 DIAGNOSIS — N319 Neuromuscular dysfunction of bladder, unspecified: Secondary | ICD-10-CM | POA: Diagnosis present

## 2011-07-03 DIAGNOSIS — N390 Urinary tract infection, site not specified: Principal | ICD-10-CM | POA: Diagnosis present

## 2011-07-03 DIAGNOSIS — Z7401 Bed confinement status: Secondary | ICD-10-CM

## 2011-07-03 DIAGNOSIS — L89109 Pressure ulcer of unspecified part of back, unspecified stage: Secondary | ICD-10-CM | POA: Diagnosis present

## 2011-07-03 DIAGNOSIS — Z79899 Other long term (current) drug therapy: Secondary | ICD-10-CM

## 2011-07-03 DIAGNOSIS — L8993 Pressure ulcer of unspecified site, stage 3: Secondary | ICD-10-CM | POA: Diagnosis present

## 2011-07-03 DIAGNOSIS — Z86711 Personal history of pulmonary embolism: Secondary | ICD-10-CM

## 2011-07-03 DIAGNOSIS — Z681 Body mass index (BMI) 19 or less, adult: Secondary | ICD-10-CM

## 2011-07-03 DIAGNOSIS — M2459 Contracture, other specified joint: Secondary | ICD-10-CM | POA: Diagnosis present

## 2011-07-03 LAB — URINE MICROSCOPIC-ADD ON

## 2011-07-03 LAB — URINALYSIS, ROUTINE W REFLEX MICROSCOPIC
Bilirubin Urine: NEGATIVE
Nitrite: POSITIVE — AB
Specific Gravity, Urine: 1.019 (ref 1.005–1.030)
Urobilinogen, UA: 1 mg/dL (ref 0.0–1.0)

## 2011-07-03 LAB — CBC
HCT: 39.2 % (ref 39.0–52.0)
MCHC: 33.2 g/dL (ref 30.0–36.0)
MCV: 84.3 fL (ref 78.0–100.0)
Platelets: 237 10*3/uL (ref 150–400)
RDW: 15.6 % — ABNORMAL HIGH (ref 11.5–15.5)

## 2011-07-03 LAB — COMPREHENSIVE METABOLIC PANEL
Albumin: 3.2 g/dL — ABNORMAL LOW (ref 3.5–5.2)
BUN: 13 mg/dL (ref 6–23)
Calcium: 9.2 mg/dL (ref 8.4–10.5)
Creatinine, Ser: 0.73 mg/dL (ref 0.50–1.35)
GFR calc Af Amer: >60 mL/min (ref >60)
Glucose, Bld: 94 mg/dL (ref 70–99)
Total Protein: 7.9 g/dL (ref 6.0–8.3)

## 2011-07-03 LAB — DIFFERENTIAL
Basophils Absolute: 0 10*3/uL (ref 0.0–0.1)
Eosinophils Absolute: 0 10*3/uL (ref 0.0–0.7)
Eosinophils Relative: 0 % (ref 0–5)
Lymphocytes Relative: 8 % — ABNORMAL LOW (ref 12–46)
Lymphs Abs: 1.1 10*3/uL (ref 0.7–4.0)
Monocytes Absolute: 1.5 10*3/uL — ABNORMAL HIGH (ref 0.1–1.0)

## 2011-07-03 LAB — PROCALCITONIN: Procalcitonin: 0.15 ng/mL

## 2011-07-04 LAB — COMPREHENSIVE METABOLIC PANEL
AST: 17 U/L (ref 0–37)
CO2: 23 mEq/L (ref 19–32)
Calcium: 9.5 mg/dL (ref 8.4–10.5)
Creatinine, Ser: 0.8 mg/dL (ref 0.50–1.35)
GFR calc Af Amer: >60 mL/min (ref >60)
GFR calc non Af Amer: >60 mL/min (ref >60)
Glucose, Bld: 86 mg/dL (ref 70–99)

## 2011-07-04 LAB — DIFFERENTIAL
Eosinophils Absolute: 0.2 10*3/uL (ref 0.0–0.7)
Lymphs Abs: 1.7 10*3/uL (ref 0.7–4.0)
Monocytes Absolute: 1.1 10*3/uL — ABNORMAL HIGH (ref 0.1–1.0)
Monocytes Relative: 12 % (ref 3–12)
Neutrophils Relative %: 68 % (ref 43–77)

## 2011-07-04 LAB — CBC
MCH: 27.7 pg (ref 26.0–34.0)
MCV: 84.9 fL (ref 78.0–100.0)
Platelets: 193 10*3/uL (ref 150–400)
RBC: 4.7 MIL/uL (ref 4.22–5.81)

## 2011-07-04 NOTE — H&P (Signed)
[name], [name] NO.:  [id number]  MEDICAL RECORD NO.:  [id number]  LOCATION:  WLED                         FACILITY:  [facility]  PHYSICIAN:  Talmage Nap, MD  DATE OF BIRTH:  May 30, 1951  DATE OF ADMISSION:  07/03/2011 DATE OF DISCHARGE:                             HISTORY & PHYSICAL   PRIMARY CARE PHYSICIAN:  Medical laboratory scientific officer.  History mainly obtainable from ED physician and ER notes as well as nursing home notes.  The patient is semiconscious, not able to give any adequate history.  CHIEF COMPLAINT:  Fever and tachycardia observed at the nursing home.  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  There was, however, no history of chest pain.  There was no history of nausea. There was no history of vomiting.  No chest pain or shortness of breath. No diarrhea.  No abdominal discomfort and subsequently, the patient was brought to the emergency room to be evaluated.  PAST MEDICAL HISTORY:  Positive for; 1. Multiple sclerosis. 2. Neurogenic bladder. 3. Pulmonary embolism. 4. Recurrent UTI. 5. Chronic sacral decubitus. 6. Dysphagia. 7. Hypertension.  PAST SURGICAL HISTORY:  Neurogenic bladder secondary to multiple sclerosis, status post chronic bladder catheterization.  PREADMISSION MEDICATIONS:  Include; 1. Ensure high-protein vanilla 237 mL p.o. b.i.d. 2. Metoprolol tartrate 25 mg half a tablet p.o. b.i.d. 3. Calcium carbonate/vitamin D of 500/200 one p.o. q.a.m. 4. Multivitamin one p.o. daily. 5. Celexa (citalopram) 10 mg 1 p.o. daily at bedtime. 6. Levaquin (levofloxacin) 500 mg  one p.o. daily. 7. Cyclobenzaprine 10 mg one p.o. t.i.d. p.r.n.  ALLERGIES:  He has no known allergy.  SOCIAL HISTORY:  Negative for alcohol or tobacco use.  The patient is completely bedridden.  FAMILY HISTORY:  Unknown.  REVIEW OF SYSTEMS:  Unobtainable since the patient is semi-obtunded and does not follow commands.  PHYSICAL EXAMINATION:  GENERAL:  On examination, middle-aged man looking wasted and cachectic, semicomatose, obtunded, dehydrated. PRESENT VITAL SIGNS:  Blood pressure is 143/90, pulse is 104, respiratory rate is 19. HEENT:  Proptosis, pallor.  Pupils are reactive to light. NECK:  No jugular venous distention.  No carotid bruit.  No lymphadenopathy. CHEST:  Showed decreased breath sounds globally secondary to poor respiratory effort.  No adventitious sounds. HEART:  Sounds are 1 and 2, tachycardic. ABDOMEN:  Full.  Organs not palpable.  Bowel sounds are hypoactive. EXTREMITIES:  Showed no pedal edema. NEUROLOGIC:  Full neurologic exam not done since the patient does not follow commands. MUSCULOSKELETAL SYSTEM:  Show contracture of the upper and lower extremities with muscle wasting, especially of the lower extremity. SKIN:  Showed markedly decreased turgor and also sacral decubitus.  LABORATORY DATA:  Initial complete blood count with differential showed WBC of 13.9, hemoglobin of 13.0, hematocrit of 39.2, MCV of 84.3, platelet count of 237, neutrophils 81%, absolute neutrophil count is 11.3.  Lactic acid  level is 1.1.  Comprehensive metabolic panel showed sodium of 138, potassium of 3.4, chloride of 102 with a bicarbonate of 23, glucose is 94, BUN is 13, creatinine 0.73.  LFT normal. Procalcitonin level is 0.15.  Urinalysis showed large blood, nitrite positive, with moderate leukocyte esterase.  Urine microscopy showed wbc of 7 to 10 with many bacteria.  Routine MRSA screening was negative. Imaging studies done include 2-view of the chest which  showed questionable retrocardiac opacification, i.e., pneumonia versus atelectasis.  IMPRESSION: 1. Fever/tachycardia, most likely secondary to urinary tract     infection. 2. Dehydration. 3. Hypokalemia. 4. Multiple sclerosis. 5. Neurogenic bladder secondary to multiple sclerosis, status post     chronic bladder catheterization. 6. History of pulmonary embolism. 7. Hypertension. 8. Dysphagia. 9. Chronic sacral decubitus. 10.Generalized deconditioning with contracture of the lower extremity     and wasting.  PLAN:  To admit the patient to general medical floor.  The patient will be adequately rehydrated with normal saline with 20 mEq of potassium chloride IV to go at rate of 120 mL an hour.  He will be treated for UTI with Cipro 400 mg IV q.12 hourly.  Fever will be controlled with Tylenol suppository one q.4 hour, p.r.n.  He will also be restarted on some of his medication which include Flexeril 10 mg p.o. daily, Celexa 10 mg p.o. daily.  Tachycardia will be controlled with Lopressor 12.5 mg p.o. b.i.d.  GI prophylaxis will be done with Protonix 40 mg IV q.24 and DVT prophylaxis with Lovenox 40 mg subcu q.24.  Further workup to be done onthis patient will include blood culture x2, urine culture before starting IV antibiotics; CBC, CMP, and magnesium will be repeated in a.m.  The patient will be followed and evaluated on day-to-day basis.     Talmage Nap, MD     CN/MEDQ  D:  07/03/2011  T:  07/03/2011  Job:  [id number]  Electronically Signed by Talmage Nap  on 07/04/2011 06:10:39 PM

## 2011-07-05 LAB — BASIC METABOLIC PANEL
BUN: 2 — ABNORMAL LOW
BUN: 5 — ABNORMAL LOW
BUN: <1 — ABNORMAL LOW
BUN: <1 — ABNORMAL LOW
CO2: 24
CO2: 26
Calcium: 9.7
Chloride: 108
Chloride: 108
Chloride: 112
Creatinine, Ser: 0.81
Creatinine, Ser: 0.84
Creatinine, Ser: 0.95
GFR calc Af Amer: >60
GFR calc Af Amer: >60
GFR calc non Af Amer: >60
GFR calc non Af Amer: >60
Glucose, Bld: 87
Glucose, Bld: 93
Potassium: 3.7
Potassium: 3.9
Potassium: 3.9
Potassium: 4.1
Sodium: 138
Sodium: 141
Sodium: 143
Sodium: 144

## 2011-07-05 LAB — DIFFERENTIAL
Basophils Relative: 0
Eosinophils Relative: 2 % (ref 0–5)
Eosinophils Relative: 0
Lymphocytes Relative: 17 % (ref 12–46)
Lymphocytes Relative: 2 — ABNORMAL LOW
Lymphs Abs: 1.5 10*3/uL (ref 0.7–4.0)
Monocytes Absolute: 1.1 10*3/uL — ABNORMAL HIGH (ref 0.1–1.0)
Neutro Abs: 22.2 — ABNORMAL HIGH
Neutrophils Relative %: 96 — ABNORMAL HIGH

## 2011-07-05 LAB — URINE CULTURE
Colony Count: >=100000
Colony Count: NO GROWTH

## 2011-07-05 LAB — CBC
HCT: 36.2 % — ABNORMAL LOW (ref 39.0–52.0)
HCT: 33.8 — ABNORMAL LOW
HCT: 38.2 — ABNORMAL LOW
HCT: 38.9 — ABNORMAL LOW
HCT: 46.4
Hemoglobin: 11.4 — ABNORMAL LOW
Hemoglobin: 12.8 — ABNORMAL LOW
Hemoglobin: 15.7
MCHC: 32.3 g/dL (ref 30.0–36.0)
MCHC: 33.9
MCHC: 33.9
MCHC: 34.1
MCV: 85 fL (ref 78.0–100.0)
MCV: 87.1
MCV: 87.9
MCV: 88.2
MCV: 88.9
Platelets: 164
Platelets: 179
Platelets: 231
Platelets: 235
Platelets: 244
RBC: 4.38
RBC: 5.3
RDW: 15.2 % (ref 11.5–15.5)
RDW: 14.8
RDW: 15.3
WBC: 9.1 10*3/uL (ref 4.0–10.5)
WBC: 13 — ABNORMAL HIGH
WBC: 18.7 — ABNORMAL HIGH
WBC: 29.5 — ABNORMAL HIGH
WBC: 8.4
WBC: 8.6

## 2011-07-05 LAB — URINALYSIS, ROUTINE W REFLEX MICROSCOPIC
Glucose, UA: NEGATIVE
Ketones, ur: 40 — AB
Ketones, ur: 40 — AB
Nitrite: POSITIVE — AB
Protein, ur: >300 — AB
Urobilinogen, UA: 1
pH: 6
pH: 7

## 2011-07-05 LAB — CULTURE, BLOOD (ROUTINE X 2)
Culture: NO GROWTH
Culture: NO GROWTH

## 2011-07-05 LAB — COMPREHENSIVE METABOLIC PANEL
AST: 19
Albumin: 2.5 g/dL — ABNORMAL LOW (ref 3.5–5.2)
Albumin: 3.4 — ABNORMAL LOW
BUN: 8 mg/dL (ref 6–23)
Calcium: 8.9
Chloride: 109 mEq/L (ref 96–112)
Creatinine, Ser: 0.68 mg/dL (ref 0.50–1.35)
Creatinine, Ser: 0.97
GFR calc Af Amer: >60 mL/min (ref >60)
GFR calc Af Amer: >60
GFR calc non Af Amer: >60
Glucose, Bld: 83 mg/dL (ref 70–99)
Total Bilirubin: 0.4 mg/dL (ref 0.3–1.2)
Total Protein: 6.9

## 2011-07-05 LAB — PREALBUMIN: Prealbumin: 7.8 — ABNORMAL LOW

## 2011-07-05 LAB — URINE MICROSCOPIC-ADD ON

## 2011-07-05 LAB — APTT: aPTT: 33

## 2011-07-05 LAB — WOUND CULTURE

## 2011-07-05 LAB — MAGNESIUM: Magnesium: 2 mg/dL (ref 1.5–2.5)

## 2011-07-05 NOTE — Discharge Summary (Signed)
[name], [name] NO.:  [id number]  MEDICAL RECORD NO.:  [id number]  LOCATION:  1315                         FACILITY:  [facility]  PHYSICIAN:  Talmage Nap, MD  DATE OF BIRTH:  28-Feb-1951  DATE OF ADMISSION:  07/03/2011 DATE OF DISCHARGE:  07/05/2011                        DISCHARGE SUMMARY - REFERRING   PRIMARY CARE PHYSICIAN:  Insurance claims handler physician.  DISCHARGE DIAGNOSES: 1. Urinary tract infection. 2. History of recurrent urinary tract infections. 3. Multiple sclerosis. 4. Neurogenic bladder status post chronic bladder catheterization. 5. History of pulmonary embolism. 6. Chronic sacral decubitus. 7. Dysphagia. 8. Hypertension. 9. Generalized deconditioning with contracture of the extremity and      wasting.  The patient is a 60 year old African American male with history of multiple sclerosis, neurogenic bladder status post chronic bladder catheterization and also contracture of the extremities with wasting was admitted from the nursing home because of fever and tachycardia.  There was, however, no history of chest pain.  There was no history of nausea or vomiting.  There was no history of abdominal pain and subsequently, the patient was admitted for stabilization.  PAST SURGICAL HISTORY:  Refer to initial history and physical dictated by Dr. Talmage Nap.  PREADMISSION MEDICATIONS:  Refer to initial history and physical dictated by Dr. Talmage Nap.  ALLERGIES:  Refer to initial history and physical dictated by Dr. Talmage Nap.  SOCIAL HISTORY:  Refer to initial history and physical dictated by Dr. Talmage Nap.  FAMILY HISTORY:  Refer to initial history and physical dictated by Dr. Talmage Nap.  REVIEW OF SYSTEMS:  Refer to initial history and physical dictated by Dr. Talmage Nap.  PHYSICAL EXAM:  At the time the patient was seen by me on admission, GENERAL:  He was looking wasted and  cachectic, severely dehydrated, semicomatose. VITAL SIGNS:  Blood pressure was 142/90, pulse 104, respiratory rate 19. HEENT:  Proptosis, pallor, but pupils are reactive to light. NECK:  He had no jugular venous distention.  No carotid bruit.  No lymphadenopathy. CHEST:  Showed decreased breath sounds globally secondary to poor respiratory effort, no adventitious sounds. HEART:  Sounds were 1 and 2 tachycardic. ABDOMEN:  Full organ was not palpable.  Bowel sounds were hypoactive. EXTREMITIES:  Showed no pedal edema. NEUROLOGIC EXAM:  Full neurological exam was not done since the patient does not follow commands. MUSCULOSKELETAL SYSTEM:  Showed contracture of the upper and lower extremities with muscle wasting and sacral decubitus.  LAB DATA:  Initial complete blood count with differential showed WBC of 38.9, hemoglobin of 13.0, hematocrit of 39.2, MCV of 84.3, platelet count of 237, neutrophil is 81, and absolute neutrophil count 11.7. Lactic acid level is 1.1.  Comprehensive metabolic panel showed sodium of 138, potassium of 3.4, chloride of 102 with a bicarb of 23, glucose is 94, BUN is 13, creatinine is 0.73.  LFTs are normal.  Procalcitonin level is 0.15.  Urinalysis showed nitrite-positive with moderate leukocyte esterase.  Urine microscopy showed WBC 7-10, many bacteria. Routine MRSA screening negative.  Blood culture, no growth.  Urine culture grew Providencia stuartii sensitive to ceftriaxone, cefoxitin and amikacin.  A repeat complete blood count with differential done on July 05, 2011,  showed WBC of 9.1, hemoglobin 11.7, hematocrit 36.2, MCV of 85.0 with a platelet count of 199, neutrophils 69%, monocytes 13, and absolute neutrophil count 6.2 normal.  Comprehensive metabolic panel showed sodium of 139, potassium of 4.0, chloride of 109 with a bicarb of 24, glucose is 83, BUN is 8, creatinine 0.68, and magnesium level is 2.0.  HOSPITAL COURSE:  The patient was admitted  to general medical floor, was vigorously rehydrated with normal saline with 20 mEq of KCL IV to go at rate of 120 cc an hour and was also given Cipro 400 mg IV q.12 hourly, however, when urine culture and antibiogram showed Cipro was resistant, the patient was switched over to cefoxitin 2 g IV stat and to continue with Ceftin 500 mg p.o. b.i.d. at the nursing home.  Other medications given to the patient include Tylenol suppository one q.4 p.r.n. for fever,  Flexeril 10 mg p.o. daily, Celexa 10 mg p.o. daily, and Lopressor 12.5 mg p.o. b.i.d.  GI prophylaxis was done with Protonix 40 mg IV q.24 and DVT prophylaxis with Lovenox 40 mg subcutaneously q.24. The patient was subsequently evaluated and followed by me on a daily basis and his mentation improved remarkably.  Awake and was able to nod his head in response to questions.  The patient was seen by me today, which is July 05, 2011 and that was said to be unremarkable tolerating feeds and not in any distress.  Hydration has improved remarkably.  Vital signs; blood pressure is 132/83, temperature is 98.3, pulse 92, respiratory rate 18, and medically stable.  Plan is for the patient to be discharged back to the nursing home today with frequent turning to prevent decubitus ulcers.  He will continue with his dysphagia diet.  DISCHARGE MEDICATIONS:  Medication to be taken at the facility will include: 1. Cefzil 500 mg one p.o. b.i.d. for 7 days. 2. Calcium carbonate/vitamin D 500/200 one p.o. q.a.m. 3. Celexa (citalopram) 10 mg one p.o. daily at bedtime. 4. Cyclobenzaprine 10 mg one p.o. t.i.d. p.r.n. 5. Ensure High-protein Vanilla 237 mL by mouth b.i.d. 6. Metoprolol tartrate 25 mg half a tablet p.o. b.i.d. 7. Multivitamin one p.o. daily.    Talmage Nap, MD    CN/MEDQ  D:  07/05/2011  T:  07/05/2011  Job:  [id number]  cc:   [facility]  Electronically Signed by Talmage Nap  on 07/05/2011  05:59:25 PM

## 2011-07-06 LAB — URINALYSIS, ROUTINE W REFLEX MICROSCOPIC
Nitrite: POSITIVE — AB
Protein, ur: 100 — AB

## 2011-07-06 LAB — URINE CULTURE

## 2011-07-09 LAB — CULTURE, BLOOD (ROUTINE X 2)
Culture  Setup Time: 201209181407
Culture: NO GROWTH

## 2011-07-12 LAB — CBC
HCT: 43.6
HCT: 38.8 — ABNORMAL LOW
HCT: 42.5
Hemoglobin: 14.6
MCHC: 33.1
MCHC: 33.3
MCHC: 32.9
MCV: 85.5
MCV: 85.2
Platelets: 213
Platelets: 206
Platelets: 212
Platelets: 225
Platelets: 235
Platelets: 265
RBC: 4.47
RBC: 5.08
RBC: 4.98
RBC: 5.22
RDW: 15.1
RDW: 15.3
RDW: 15.5
WBC: 6.7
WBC: 9.2
WBC: 9.6
WBC: 12.1 — ABNORMAL HIGH
WBC: 6.9
WBC: 8.4
WBC: 9.3

## 2011-07-12 LAB — URINALYSIS, ROUTINE W REFLEX MICROSCOPIC
Bilirubin Urine: NEGATIVE
Bilirubin Urine: NEGATIVE
Glucose, UA: NEGATIVE
Glucose, UA: NEGATIVE
Nitrite: POSITIVE — AB
Nitrite: NEGATIVE
Nitrite: POSITIVE — AB
Protein, ur: 30 — AB
Specific Gravity, Urine: 1.028
Specific Gravity, Urine: 1.029
Specific Gravity, Urine: 1.017
Specific Gravity, Urine: 1.03
pH: 6.5
pH: 7.5
pH: 7
pH: 7

## 2011-07-12 LAB — URINE MICROSCOPIC-ADD ON

## 2011-07-12 LAB — BASIC METABOLIC PANEL
BUN: 12
BUN: 6
BUN: 7
CO2: 27
Calcium: 8.4
Calcium: 9.1
Calcium: 8.9
Chloride: 109
Creatinine, Ser: 0.89
Creatinine, Ser: 0.81
Creatinine, Ser: 0.82
Creatinine, Ser: 0.92
GFR calc Af Amer: >60
GFR calc Af Amer: >60
GFR calc Af Amer: >60
GFR calc non Af Amer: >60
GFR calc non Af Amer: >60
GFR calc non Af Amer: >60
GFR calc non Af Amer: >60
Glucose, Bld: 102 — ABNORMAL HIGH
Glucose, Bld: 92
Potassium: 4.1
Potassium: 3.7
Sodium: 140

## 2011-07-12 LAB — DIFFERENTIAL
Eosinophils Absolute: 0.2
Eosinophils Relative: 1
Lymphocytes Relative: 12
Lymphs Abs: 1.6
Metamyelocytes Relative: 0
Monocytes Relative: 8
Monocytes Relative: 9
Myelocytes: 0
Promyelocytes Absolute: 0
nRBC: 0

## 2011-07-12 LAB — URINE CULTURE: Colony Count: >=100000

## 2011-07-12 LAB — COMPREHENSIVE METABOLIC PANEL
ALT: 55 — ABNORMAL HIGH
AST: 29
Albumin: 3.4 — ABNORMAL LOW
Calcium: 9.8
Creatinine, Ser: 0.83
GFR calc Af Amer: >60
Sodium: 142
Total Protein: 7.3

## 2011-07-12 LAB — CULTURE, ROUTINE-ABSCESS

## 2011-07-12 LAB — POCT I-STAT, CHEM 8
BUN: 9
Calcium, Ion: 1.11 — ABNORMAL LOW
Chloride: 107
Glucose, Bld: 100 — ABNORMAL HIGH

## 2011-07-12 LAB — OCCULT BLOOD X 1 CARD TO LAB, STOOL: Fecal Occult Bld: NEGATIVE

## 2011-07-16 ENCOUNTER — Emergency Department (HOSPITAL_COMMUNITY): Payer: Medicare Other

## 2011-07-16 ENCOUNTER — Emergency Department (HOSPITAL_COMMUNITY)
Admission: EM | Admit: 2011-07-16 | Discharge: 2011-07-16 | Disposition: A | Discharge status: Home / Self Care | Payer: Medicare Other | Attending: Emergency Medicine | Admitting: Emergency Medicine

## 2011-07-16 DIAGNOSIS — G8220 Paraplegia, unspecified: Secondary | ICD-10-CM | POA: Insufficient documentation

## 2011-07-16 DIAGNOSIS — I10 Essential (primary) hypertension: Secondary | ICD-10-CM | POA: Insufficient documentation

## 2011-07-16 DIAGNOSIS — Z86718 Personal history of other venous thrombosis and embolism: Secondary | ICD-10-CM | POA: Insufficient documentation

## 2011-07-16 DIAGNOSIS — R0602 Shortness of breath: Secondary | ICD-10-CM | POA: Insufficient documentation

## 2011-07-16 DIAGNOSIS — R0609 Other forms of dyspnea: Secondary | ICD-10-CM | POA: Insufficient documentation

## 2011-07-16 DIAGNOSIS — J40 Bronchitis, not specified as acute or chronic: Secondary | ICD-10-CM | POA: Insufficient documentation

## 2011-07-16 DIAGNOSIS — G35 Multiple sclerosis: Secondary | ICD-10-CM | POA: Insufficient documentation

## 2011-07-16 DIAGNOSIS — R0989 Other specified symptoms and signs involving the circulatory and respiratory systems: Secondary | ICD-10-CM | POA: Insufficient documentation

## 2011-07-16 LAB — URINE MICROSCOPIC-ADD ON

## 2011-07-16 LAB — DIFFERENTIAL
Basophils Absolute: 0 10*3/uL (ref 0.0–0.1)
Eosinophils Relative: 1 % (ref 0–5)
Lymphocytes Relative: 16 % (ref 12–46)
Lymphs Abs: 1.8 10*3/uL (ref 0.7–4.0)
Neutro Abs: 8.6 10*3/uL — ABNORMAL HIGH (ref 1.7–7.7)
Neutrophils Relative %: 78 % — ABNORMAL HIGH (ref 43–77)

## 2011-07-16 LAB — BASIC METABOLIC PANEL
BUN: 14 mg/dL (ref 6–23)
CO2: 25 mEq/L (ref 19–32)
Chloride: 105 mEq/L (ref 96–112)
GFR calc Af Amer: >90 mL/min (ref >90)
Potassium: 3.9 mEq/L (ref 3.5–5.1)

## 2011-07-16 LAB — URINALYSIS, ROUTINE W REFLEX MICROSCOPIC
Bilirubin Urine: NEGATIVE
Glucose, UA: NEGATIVE mg/dL
Nitrite: NEGATIVE
Specific Gravity, Urine: 1.024 (ref 1.005–1.030)
pH: 6 (ref 5.0–8.0)

## 2011-07-16 LAB — CBC
HCT: 41.1 % (ref 39.0–52.0)
Hemoglobin: 14 g/dL (ref 13.0–17.0)
MCV: 84.7 fL (ref 78.0–100.0)
RBC: 4.85 MIL/uL (ref 4.22–5.81)
RDW: 15.4 % (ref 11.5–15.5)
WBC: 11.1 10*3/uL — ABNORMAL HIGH (ref 4.0–10.5)

## 2011-07-17 LAB — DIFFERENTIAL
Band Neutrophils: 0 % (ref 0–10)
Basophils Absolute: 0 10*3/uL (ref 0.0–0.1)
Basophils Relative: 0 % (ref 0–1)
Blasts: 0 %
Lymphocytes Relative: 11 % — ABNORMAL LOW (ref 12–46)
Lymphs Abs: 1.4 10*3/uL (ref 0.7–4.0)
Myelocytes: 0 %
Neutrophils Relative %: 75 % (ref 43–77)
Promyelocytes Absolute: 0 %

## 2011-07-17 LAB — CBC
MCHC: 33.3 g/dL (ref 30.0–36.0)
MCV: 88.1 fL (ref 78.0–100.0)
Platelets: 177 10*3/uL (ref 150–400)
RDW: 13.7 % (ref 11.5–15.5)

## 2011-07-17 LAB — URINE MICROSCOPIC-ADD ON

## 2011-07-17 LAB — URINALYSIS, ROUTINE W REFLEX MICROSCOPIC
Nitrite: POSITIVE — AB
Protein, ur: 100 mg/dL — AB
Urobilinogen, UA: 0.2 mg/dL (ref 0.0–1.0)

## 2011-07-17 LAB — COMPREHENSIVE METABOLIC PANEL
ALT: 29 U/L (ref 0–53)
Alkaline Phosphatase: 68 U/L (ref 39–117)
BUN: 15 mg/dL (ref 6–23)
GFR calc Af Amer: >60 mL/min (ref >60)
Glucose, Bld: 93 mg/dL (ref 70–99)
Total Bilirubin: 1.4 mg/dL — ABNORMAL HIGH (ref 0.3–1.2)
Total Protein: 7.6 g/dL (ref 6.0–8.3)

## 2011-07-17 LAB — CULTURE, BLOOD (ROUTINE X 2): Culture: NO GROWTH

## 2011-07-17 LAB — URINE CULTURE

## 2011-07-19 LAB — CK TOTAL AND CKMB (NOT AT ARMC): Total CK: 243 U/L — ABNORMAL HIGH (ref 7–232)

## 2011-07-19 LAB — COMPREHENSIVE METABOLIC PANEL
ALT: 22 U/L (ref 0–53)
AST: 18 U/L (ref 0–37)
Alkaline Phosphatase: 55 U/L (ref 39–117)
CO2: 26 mEq/L (ref 19–32)
Chloride: 108 mEq/L (ref 96–112)
GFR calc Af Amer: >60 mL/min (ref >60)
GFR calc non Af Amer: >60 mL/min (ref >60)
Glucose, Bld: 109 mg/dL — ABNORMAL HIGH (ref 70–99)
Potassium: 3.9 mEq/L (ref 3.5–5.1)
Sodium: 138 mEq/L (ref 135–145)
Total Bilirubin: 0.6 mg/dL (ref 0.3–1.2)

## 2011-07-19 LAB — BASIC METABOLIC PANEL
BUN: 6 mg/dL (ref 6–23)
CO2: 26 mEq/L (ref 19–32)
Chloride: 110 mEq/L (ref 96–112)
Creatinine, Ser: 1 mg/dL (ref 0.4–1.5)
Glucose, Bld: 108 mg/dL — ABNORMAL HIGH (ref 70–99)
Potassium: 4 mEq/L (ref 3.5–5.1)

## 2011-07-19 LAB — CBC
HCT: 42.3 % (ref 39.0–52.0)
MCHC: 33.5 g/dL (ref 30.0–36.0)
MCV: 88.4 fL (ref 78.0–100.0)
RBC: 4.79 MIL/uL (ref 4.22–5.81)
WBC: 8.5 10*3/uL (ref 4.0–10.5)

## 2011-07-19 LAB — PROTIME-INR: Prothrombin Time: 14.7 seconds (ref 11.6–15.2)

## 2011-07-19 LAB — TROPONIN I: Troponin I: 0.02 ng/mL (ref 0.00–0.06)

## 2011-07-20 LAB — DIFFERENTIAL
Basophils Absolute: 0
Eosinophils Absolute: 0.2
Eosinophils Relative: 3
Lymphocytes Relative: 2 — ABNORMAL LOW
Lymphocytes Relative: 23
Lymphs Abs: 0.4 — ABNORMAL LOW
Lymphs Abs: 1.9
Monocytes Absolute: 1
Neutro Abs: 16.9 — ABNORMAL HIGH

## 2011-07-20 LAB — BASIC METABOLIC PANEL
BUN: 3 — ABNORMAL LOW
BUN: 4 — ABNORMAL LOW
CO2: 20
CO2: 23
Calcium: 7.3 — ABNORMAL LOW
Chloride: 113 — ABNORMAL HIGH
Chloride: 113 — ABNORMAL HIGH
Creatinine, Ser: 0.69
Creatinine, Ser: 0.73
Creatinine, Ser: 0.81
GFR calc Af Amer: >60
GFR calc non Af Amer: >60
GFR calc non Af Amer: >60
Glucose, Bld: 78
Glucose, Bld: 89
Potassium: 3.1 — ABNORMAL LOW
Potassium: 3.6
Potassium: 3.7
Sodium: 140

## 2011-07-20 LAB — CBC
HCT: 35.3 — ABNORMAL LOW
HCT: 35.6 — ABNORMAL LOW
HCT: 35.8 — ABNORMAL LOW
HCT: 51.1
Hemoglobin: 12 — ABNORMAL LOW
MCHC: 33.3
MCHC: 33.5
MCHC: 34.2
MCV: 87.8
MCV: 88.2
MCV: 88.6
Platelets: 126 — ABNORMAL LOW
Platelets: 157
Platelets: 162
Platelets: 183
RBC: 3.83 — ABNORMAL LOW
RBC: 4.02 — ABNORMAL LOW
RDW: 15.1
RDW: 15.4
WBC: 17.8 — ABNORMAL HIGH
WBC: 7.2
WBC: 8.1
WBC: 8.4

## 2011-07-20 LAB — URINALYSIS, ROUTINE W REFLEX MICROSCOPIC
Glucose, UA: NEGATIVE
Nitrite: NEGATIVE
Specific Gravity, Urine: 1.027
pH: 5.5

## 2011-07-20 LAB — PROTIME-INR: INR: 1.1

## 2011-07-20 LAB — WOUND CULTURE: Gram Stain: NONE SEEN

## 2011-07-20 LAB — FOLATE: Folate: >20

## 2011-07-20 LAB — COMPREHENSIVE METABOLIC PANEL
Albumin: 4.1
BUN: 8
Calcium: 10.6 — ABNORMAL HIGH
Chloride: 104
Creatinine, Ser: 1
GFR calc non Af Amer: >60
Total Bilirubin: 1.3 — ABNORMAL HIGH

## 2011-07-20 LAB — VITAMIN B12: Vitamin B-12: 478 (ref 211–911)

## 2011-07-20 LAB — ABO/RH: ABO/RH(D): O POS

## 2011-07-20 LAB — IRON AND TIBC
Iron: 60
UIBC: 119

## 2011-07-20 LAB — URINE MICROSCOPIC-ADD ON

## 2011-07-20 LAB — CULTURE, BLOOD (ROUTINE X 2): Culture: NO GROWTH

## 2011-07-20 LAB — TSH: TSH: 0.487

## 2011-07-20 LAB — MAGNESIUM
Magnesium: 1.7
Magnesium: 2.5

## 2011-07-20 LAB — FERRITIN: Ferritin: 165 (ref 22–322)

## 2011-07-20 LAB — APTT: aPTT: 33

## 2011-10-03 IMAGING — RF IR US GUIDE VASC ACCESS RIGHT
14 of 15 series · 14 of 15 positions shown · non-contrast
Comparison: none

Clinical Data/Indication: MULTIPLE SCLEROSIS

ULTRASOUND VENOUS ACCESS, PERIPHERAL VENOUS CATHETER WITH
FLUOROSCOPY
Fluoroscopy Time: 1.0 minutes.
Procedure: The procedure, risks, benefits, and alternatives were
explained to the patient. Questions regarding the procedure were
encouraged and answered. The patient understands and consents to
the procedure.
The right arm was prepped with betadine in a sterile fashion, and a
sterile drape was applied covering the operative field. A sterile
gown and sterile gloves were used for the procedure.
Under sonographic guidance, a micropuncture needle was inserted
into the right cephalic vein and removed over a 4584 wire.  A 5-
French peel-away sheath was inserted.  A double lumen 5-French
catheter was cut to 10 cm and inserted into this peripheral venous
structure.  The tip projects over the right axilla.

[Series 1: run · 1 of 1 slices shown (1 of 14)]
[im 1/1]
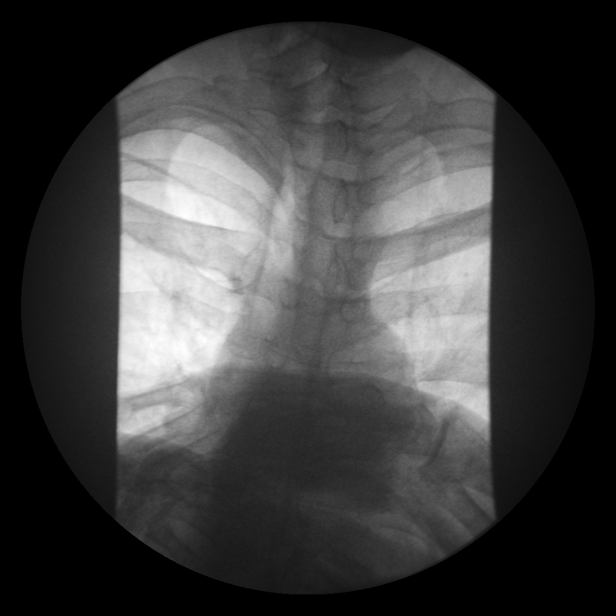

[Series 2: run · 1 of 1 slices shown (2 of 14)]
[im 1/1]
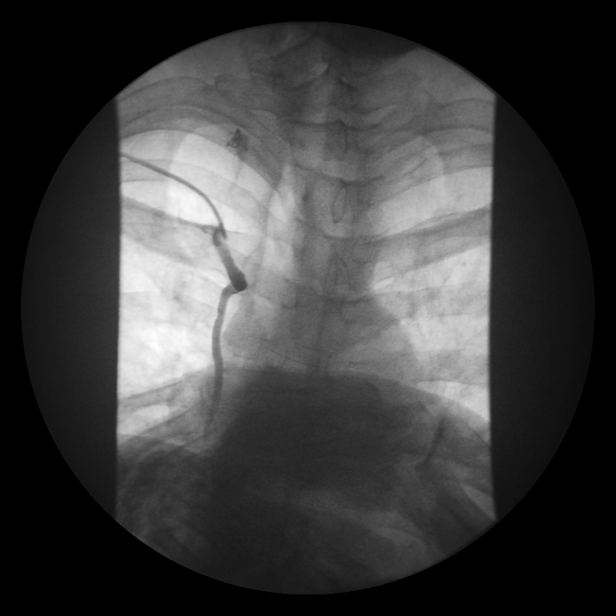

[Series 3: run · 1 of 1 slices shown (3 of 14)]
[im 1/1]
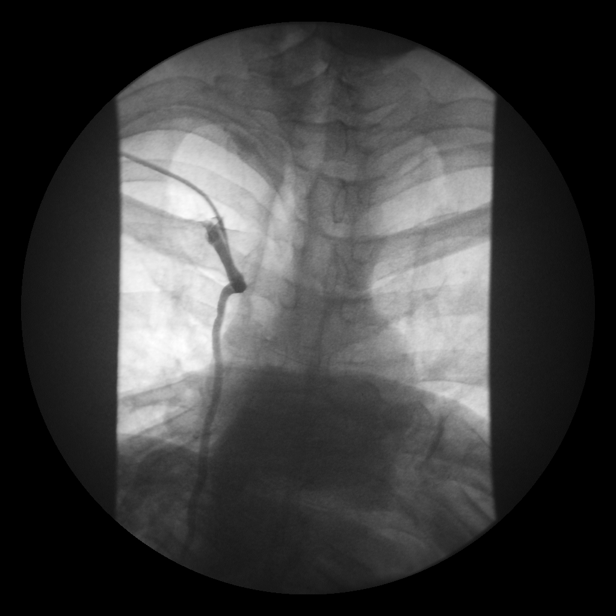

[Series 4: run · 1 of 1 slices shown (4 of 14)]
[im 1/1]
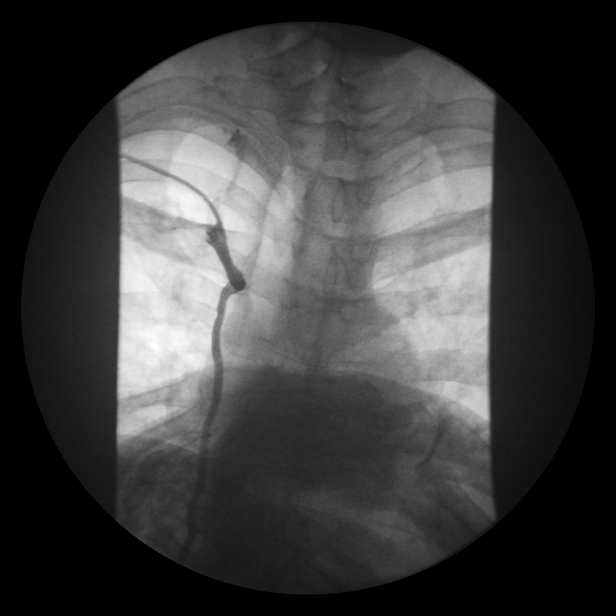

[Series 5: run · 1 of 1 slices shown (5 of 14)]
[im 1/1]
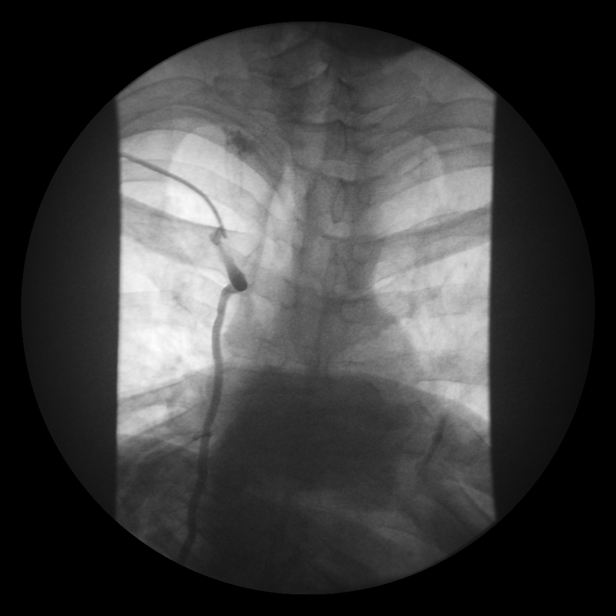

[Series 6: run · 1 of 1 slices shown (6 of 14)]
[im 1/1]
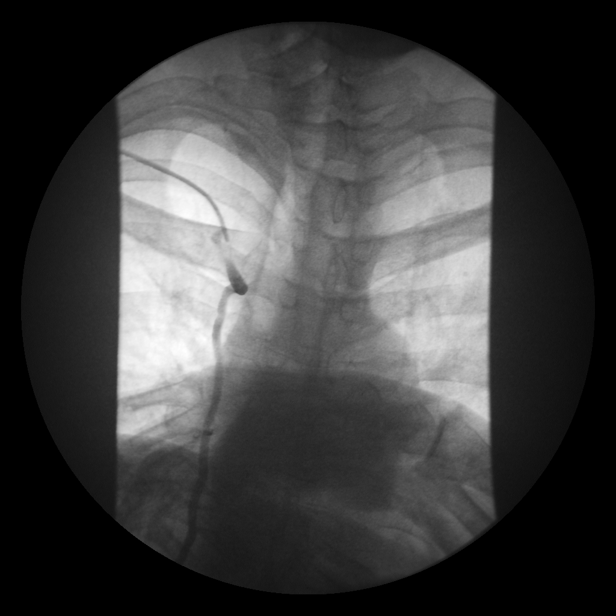

[Series 7: run · 1 of 1 slices shown (7 of 14)]
[im 1/1]
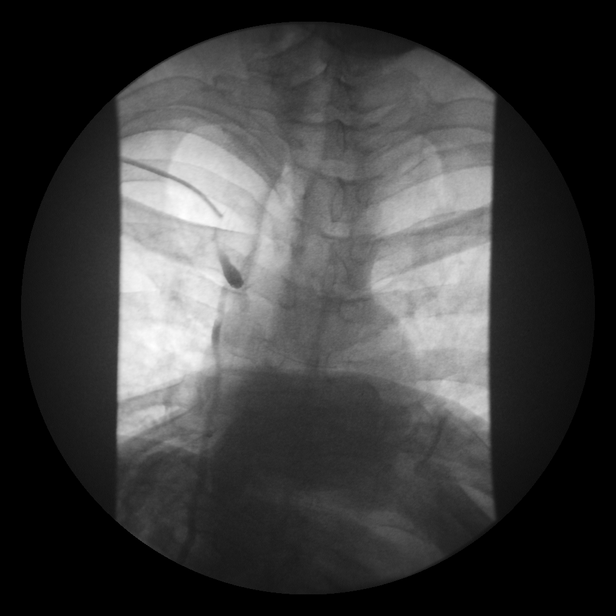

[Series 9: run · 1 of 1 slices shown (8 of 14)]
[im 1/1]
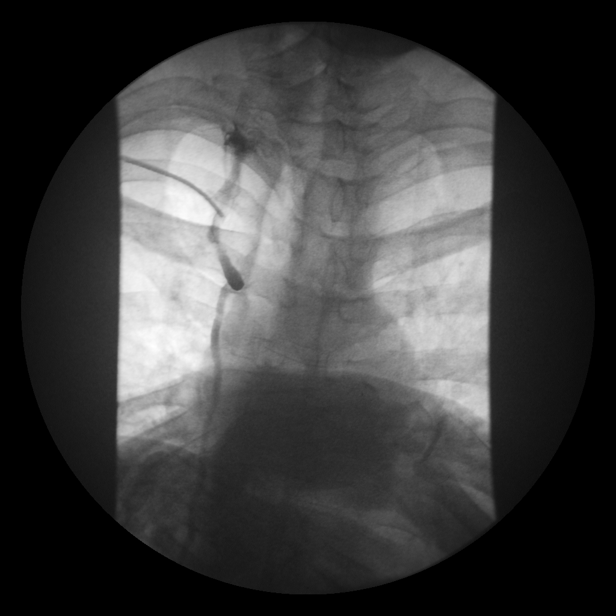

[Series 10: run · 1 of 1 slices shown (9 of 14)]
[im 1/1]
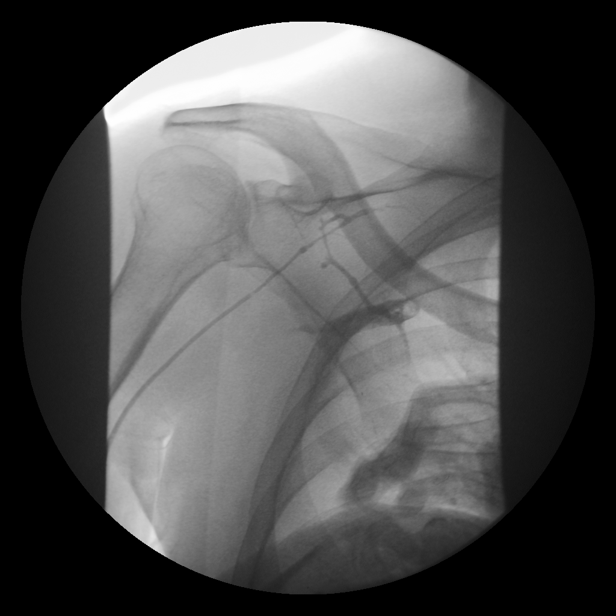

[Series 11: run · 1 of 1 slices shown (10 of 14)]
[im 1/1]
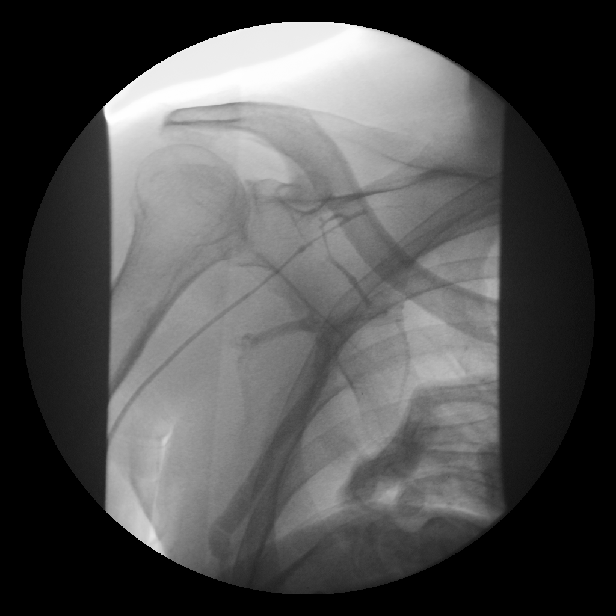

[Series 12: run · 1 of 1 slices shown (11 of 14)]
[im 1/1]
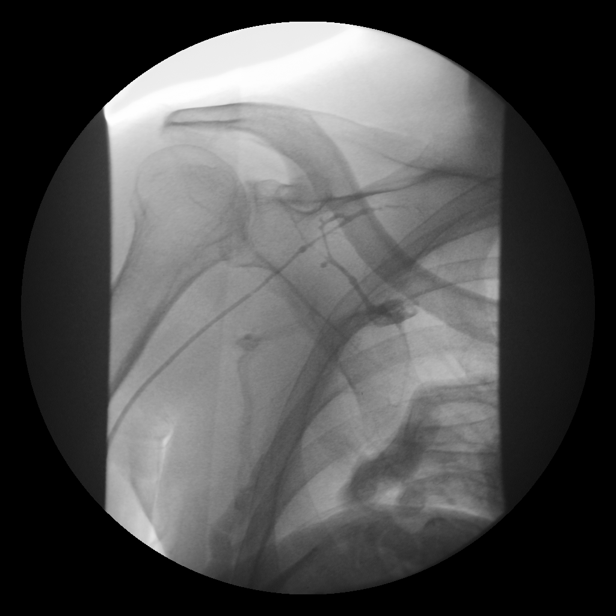

[Series 13: run · 1 of 1 slices shown (12 of 14)]
[im 1/1]
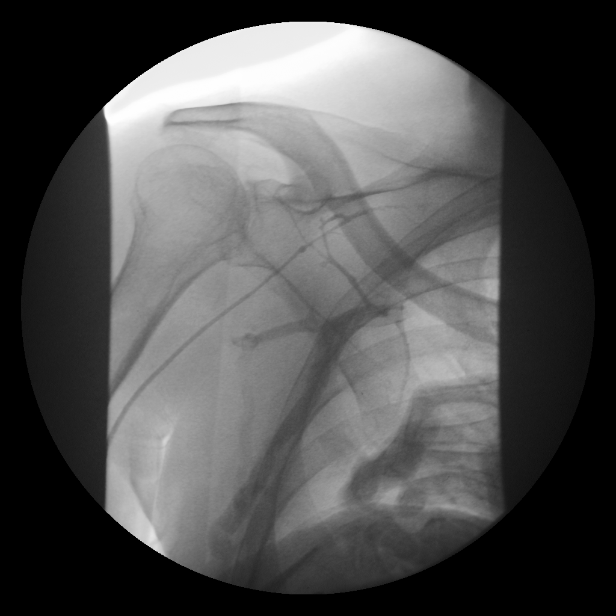

[Series 14: run · 1 of 1 slices shown (13 of 14)]
[im 1/1]
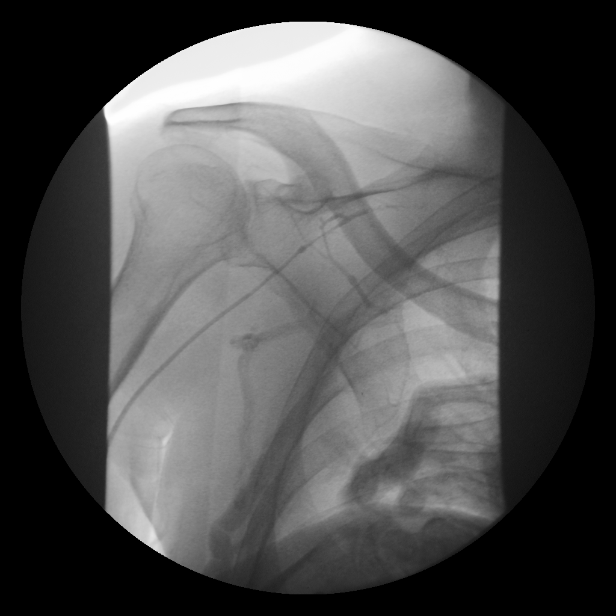

[Series 15: run · 1 of 1 slices shown (14 of 14)]
[im 1/1]
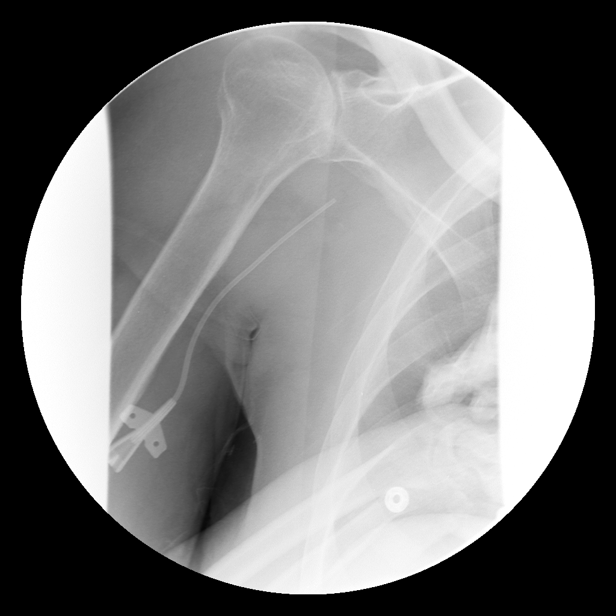

[14 of 15 positions shown; findings below may reference images not displayed]

FINDINGS: Imaging documents peripheral venous catheter placement in
the right upper extremity with its tip projecting over the right
axilla.  Sonographic documentation is also obtained for venous
access.

Complications: None.
IMPRESSION: Ultrasound and fluoroscopic guidance for right upper extremity
peripheral venous catheter placement.

## 2011-11-07 ENCOUNTER — Emergency Department (HOSPITAL_COMMUNITY)
Admission: EM | Admit: 2011-11-07 | Discharge: 2011-11-08 | Disposition: A | Discharge status: Skilled Nursing Facility | Payer: Medicare Other | Attending: Emergency Medicine | Admitting: Emergency Medicine

## 2011-11-07 DIAGNOSIS — N319 Neuromuscular dysfunction of bladder, unspecified: Secondary | ICD-10-CM | POA: Insufficient documentation

## 2011-11-07 DIAGNOSIS — R4182 Altered mental status, unspecified: Secondary | ICD-10-CM | POA: Insufficient documentation

## 2011-11-07 DIAGNOSIS — Z86711 Personal history of pulmonary embolism: Secondary | ICD-10-CM | POA: Insufficient documentation

## 2011-11-07 DIAGNOSIS — G35 Multiple sclerosis: Secondary | ICD-10-CM | POA: Insufficient documentation

## 2011-11-07 DIAGNOSIS — N390 Urinary tract infection, site not specified: Secondary | ICD-10-CM | POA: Insufficient documentation

## 2011-11-07 DIAGNOSIS — R1310 Dysphagia, unspecified: Secondary | ICD-10-CM | POA: Insufficient documentation

## 2011-11-07 DIAGNOSIS — Z436 Encounter for attention to other artificial openings of urinary tract: Secondary | ICD-10-CM | POA: Insufficient documentation

## 2011-11-07 DIAGNOSIS — R509 Fever, unspecified: Secondary | ICD-10-CM | POA: Insufficient documentation

## 2011-11-07 MED ORDER — ACETAMINOPHEN 325 MG PO TABS
650.0000 mg | ORAL_TABLET | Freq: Once | ORAL | Status: DC
  Filled 2011-11-07: qty 2

## 2011-11-07 NOTE — ED Notes (Signed)
ZOX:[id number]<AV> Expected date:11/07/11<BR> Expected time:10:48 PM<BR> Means of arrival:Ambulance<BR> Comments:<BR> EMS 261 GC, 60 yom UTI, altered loc

## 2011-11-07 NOTE — ED Notes (Signed)
Sent from [facility] for altered mental status and fever.  History for MS---Pt. Has somewhat muffled speech but is oriented to self, and complies with request during triage.  Has foley cath--and urine has a very strong odor thru the drainage bag.

## 2011-11-07 NOTE — ED Provider Notes (Signed)
History     CSN: [id number]  Arrival date & time 11/07/11  2320   First MD Initiated Contact with Patient 11/07/11 2335      Chief Complaint  Patient presents with  . Fever    (Consider location/radiation/quality/duration/timing/severity/associated sxs/prior treatment) HPI Level 5 Caveat: severe dysarthria. This is a 61 year old black male with a history of advanced multiple sclerosis. He was sent from his healthcare facility this evening for altered mental status and fever. His fever was noted to be 101.1 here. He is able to acknowledge that he is not in pain and is not nauseated. He has an indwelling suprapubic Foley catheter and his urine was noted to be cloudy and with a strong odor.  Past Medical History  Diagnosis Date  . MS (multiple sclerosis)     bedridden since 1986  . Neurogenic bladder     with suprapubic catheter  . Dysphagia   . Personal history of PE (pulmonary embolism)     on coumadin    Past Surgical History  Procedure Date  . Tonsillectomy and adenoidectomy   . Suprapubic catheter placement     11/11    No family history on file.  History  Substance Use Topics  . Smoking status: Never Smoker   . Smokeless tobacco: Not on file  . Alcohol Use: No      Review of Systems  All other systems reviewed and are negative.    Allergies  Review of patient's allergies indicates no known allergies.  Home Medications   Current Outpatient Rx  Name Route Sig Dispense Refill  . CALCIUM CARBONATE-VITAMIN D 600-400 MG-UNIT PO TABS Oral Take 1 tablet by mouth daily.    Marland Kitchen CITALOPRAM HYDROBROMIDE 20 MG PO TABS Oral Take 20 mg by mouth daily.    . CYCLOBENZAPRINE HCL 10 MG PO TABS Oral Take 10 mg by mouth 3 (three) times daily as needed. Pain    . METOPROLOL TARTRATE 25 MG PO TABS Oral Take 12.5 mg by mouth daily.    . ADULT MULTIVITAMIN W/MINERALS CH Oral Take 1 tablet by mouth daily.      BP 151/81  Pulse 97  Temp(Src) 101.1 F (38.4 C) (Oral)  Resp  18  SpO2 100%  Physical Exam General: Well-developed, well-nourished male in no acute distress; appearance consistent with age of record HENT: normocephalic, atraumatic; poor oral hygiene Eyes: pupils equal round and reactive to light; disconjugate gaze; limited extraocular muscle movement Neck: Limited range of motion due to chronic changes Heart: regular rate and rhythm Lungs: clear to auscultation bilaterally Abdomen: soft; nontender; mildly distended; suprapubic catheter draining cloudy urine Extremities: No deformity; limited range of motion; pulses normal; no edema Neurologic: Awake, alert; spastic quadriparesis Skin: Warm and dry    ED Course  SUPRAPUBIC TUBE PLACEMENT Date/Time: 11/08/2011 12:45 AM Performed by: Hanley Seamen Authorized by: Hanley Seamen Consent: Verbal consent obtained. Consent given by: patient Time out: Immediately prior to procedure a "time out" was called to verify the correct patient, procedure, equipment, support staff and site/side marked as required. Indications: catheter change Local anesthesia used: no Patient sedated: no Preparation: Patient was prepped and draped in the usual sterile fashion. Number of attempts: 1 Patient tolerance: Patient tolerated the procedure well with no immediate complications. Comments: 20 French catheter was placed in the patient's urostomy and the 30 mm bulb was inflated with 25 mL of saline. The patient has not at this time produced any urine output. The Foley will be irrigated.  The patient will be given by mouth hydration in an attempt to stimulate urine output.  1:22 AM Foley draining cloudy pale yellow urine at this time.    MDM   Nursing notes and vitals signs, including pulse oximetry, reviewed.  Summary of this visit's results, reviewed by myself:  Labs:  Results for orders placed during the hospital encounter of 11/07/11  URINALYSIS, ROUTINE W REFLEX MICROSCOPIC      Component Value Range    Color, Urine YELLOW  YELLOW    APPearance TURBID (*) CLEAR    Specific Gravity, Urine 1.017  1.005 - 1.030    pH 8.0  5.0 - 8.0    Glucose, UA NEGATIVE  NEGATIVE (mg/dL)   Hgb urine dipstick LARGE (*) NEGATIVE    Bilirubin Urine NEGATIVE  NEGATIVE    Ketones, ur NEGATIVE  NEGATIVE (mg/dL)   Protein, ur 161 (*) NEGATIVE (mg/dL)   Urobilinogen, UA 0.2  0.0 - 1.0 (mg/dL)   Nitrite NEGATIVE  NEGATIVE    Leukocytes, UA LARGE (*) NEGATIVE   CBC      Component Value Range   WBC 7.2  4.0 - 10.5 (K/uL)   RBC 4.85  4.22 - 5.81 (MIL/uL)   Hemoglobin 14.1  13.0 - 17.0 (g/dL)   HCT 09.6  04.5 - 40.9 (%)   MCV 86.4  78.0 - 100.0 (fL)   MCH 29.1  26.0 - 34.0 (pg)   MCHC 33.7  30.0 - 36.0 (g/dL)   RDW 81.1  91.4 - 78.2 (%)   Platelets 156  150 - 400 (K/uL)  DIFFERENTIAL      Component Value Range   Neutrophils Relative 77  43 - 77 (%)   Neutro Abs 5.5  1.7 - 7.7 (K/uL)   Lymphocytes Relative 11 (*) 12 - 46 (%)   Lymphs Abs 0.8  0.7 - 4.0 (K/uL)   Monocytes Relative 11  3 - 12 (%)   Monocytes Absolute 0.8  0.1 - 1.0 (K/uL)   Eosinophils Relative 1  0 - 5 (%)   Eosinophils Absolute 0.1  0.0 - 0.7 (K/uL)   Basophils Relative 0  0 - 1 (%)   Basophils Absolute 0.0  0.0 - 0.1 (K/uL)  BASIC METABOLIC PANEL      Component Value Range   Sodium 134 (*) 135 - 145 (mEq/L)   Potassium 4.3  3.5 - 5.1 (mEq/L)   Chloride 102  96 - 112 (mEq/L)   CO2 24  19 - 32 (mEq/L)   Glucose, Bld 98  70 - 99 (mg/dL)   BUN 13  6 - 23 (mg/dL)   Creatinine, Ser 9.56  0.50 - 1.35 (mg/dL)   Calcium 9.1  8.4 - 21.3 (mg/dL)   GFR calc non Af Amer >90  >90 (mL/min)   GFR calc Af Amer >90  >90 (mL/min)  URINE MICROSCOPIC-ADD ON      Component Value Range   Squamous Epithelial / LPF FEW (*) RARE    WBC, UA TOO NUMEROUS TO COUNT  <3 (WBC/hpf)   RBC / HPF 21-50  <3 (RBC/hpf)   Bacteria, UA MANY (*) RARE    Casts HYALINE CASTS (*) NEGATIVE    Urine-Other MUCOUS PRESENT              Hanley Seamen, MD 11/08/11  0865

## 2011-11-08 LAB — BASIC METABOLIC PANEL
BUN: 13 mg/dL (ref 6–23)
CO2: 24 mEq/L (ref 19–32)
Chloride: 102 mEq/L (ref 96–112)
GFR calc Af Amer: >90 mL/min (ref >90)
Glucose, Bld: 98 mg/dL (ref 70–99)
Potassium: 4.3 mEq/L (ref 3.5–5.1)

## 2011-11-08 LAB — DIFFERENTIAL
Lymphocytes Relative: 11 % — ABNORMAL LOW (ref 12–46)
Lymphs Abs: 0.8 10*3/uL (ref 0.7–4.0)
Monocytes Relative: 11 % (ref 3–12)
Neutro Abs: 5.5 10*3/uL (ref 1.7–7.7)
Neutrophils Relative %: 77 % (ref 43–77)

## 2011-11-08 LAB — URINALYSIS, ROUTINE W REFLEX MICROSCOPIC
Bilirubin Urine: NEGATIVE
Nitrite: NEGATIVE
Protein, ur: 100 mg/dL — AB
Urobilinogen, UA: 0.2 mg/dL (ref 0.0–1.0)

## 2011-11-08 LAB — URINE MICROSCOPIC-ADD ON

## 2011-11-08 LAB — CBC
Hemoglobin: 14.1 g/dL (ref 13.0–17.0)
RBC: 4.85 MIL/uL (ref 4.22–5.81)

## 2011-11-08 MED ORDER — CIPROFLOXACIN HCL 500 MG PO TABS
500.0000 mg | ORAL_TABLET | Freq: Once | ORAL | Status: AC
  Administered 2011-11-08: 500 mg via ORAL
  Filled 2011-11-08: qty 1

## 2011-11-08 MED ORDER — CIPROFLOXACIN HCL 500 MG PO TABS: 500.0000 mg | ORAL_TABLET | Freq: Two times a day (BID) | ORAL | Status: AC

## 2011-11-08 NOTE — ED Notes (Signed)
[facility] called for transport.  

## 2011-11-08 NOTE — ED Notes (Addendum)
B/P 149/82  HR  88  R 20  Pulse Ox 97%  Has approx. 45 mls of light yellow urine in collection container of foley.  Oral temp 99.5

## 2011-11-09 LAB — URINE CULTURE

## 2012-02-16 ENCOUNTER — Encounter (HOSPITAL_COMMUNITY): Payer: Self-pay | Admitting: *Deleted

## 2012-02-16 ENCOUNTER — Emergency Department (HOSPITAL_COMMUNITY)
Admission: EM | Admit: 2012-02-16 | Discharge: 2012-02-16 | Disposition: A | Discharge status: Skilled Nursing Facility | Payer: Medicare Other | Attending: Emergency Medicine | Admitting: Emergency Medicine

## 2012-02-16 DIAGNOSIS — I10 Essential (primary) hypertension: Secondary | ICD-10-CM | POA: Insufficient documentation

## 2012-02-16 DIAGNOSIS — R471 Dysarthria and anarthria: Secondary | ICD-10-CM | POA: Insufficient documentation

## 2012-02-16 DIAGNOSIS — G35 Multiple sclerosis: Secondary | ICD-10-CM | POA: Insufficient documentation

## 2012-02-16 DIAGNOSIS — N319 Neuromuscular dysfunction of bladder, unspecified: Secondary | ICD-10-CM | POA: Insufficient documentation

## 2012-02-16 DIAGNOSIS — N390 Urinary tract infection, site not specified: Secondary | ICD-10-CM | POA: Insufficient documentation

## 2012-02-16 HISTORY — DX: Essential (primary) hypertension: I10

## 2012-02-16 LAB — BASIC METABOLIC PANEL
BUN: 9 mg/dL (ref 6–23)
CO2: 22 mEq/L (ref 19–32)
Chloride: 105 mEq/L (ref 96–112)
Creatinine, Ser: 0.73 mg/dL (ref 0.50–1.35)
GFR calc Af Amer: >90 mL/min (ref >90)
Potassium: 3.9 mEq/L (ref 3.5–5.1)

## 2012-02-16 LAB — CBC
HCT: 42.3 % (ref 39.0–52.0)
Hemoglobin: 14 g/dL (ref 13.0–17.0)
RBC: 4.92 MIL/uL (ref 4.22–5.81)
WBC: 8.3 10*3/uL (ref 4.0–10.5)

## 2012-02-16 LAB — URINALYSIS, ROUTINE W REFLEX MICROSCOPIC
Bilirubin Urine: NEGATIVE
Glucose, UA: NEGATIVE mg/dL
Specific Gravity, Urine: 1.024 (ref 1.005–1.030)
Urobilinogen, UA: 0.2 mg/dL (ref 0.0–1.0)
pH: 6 (ref 5.0–8.0)

## 2012-02-16 LAB — DIFFERENTIAL
Basophils Absolute: 0 10*3/uL (ref 0.0–0.1)
Lymphocytes Relative: 20 % (ref 12–46)
Monocytes Absolute: 0.9 10*3/uL (ref 0.1–1.0)
Monocytes Relative: 11 % (ref 3–12)
Neutro Abs: 5.7 10*3/uL (ref 1.7–7.7)
Neutrophils Relative %: 68 % (ref 43–77)

## 2012-02-16 LAB — URINE MICROSCOPIC-ADD ON

## 2012-02-16 MED ORDER — SULFAMETHOXAZOLE-TRIMETHOPRIM 800-160 MG PO TABS: 1.0000 | ORAL_TABLET | Freq: Two times a day (BID) | ORAL | Status: AC

## 2012-02-16 MED ORDER — SULFAMETHOXAZOLE-TMP DS 800-160 MG PO TABS
1.0000 | ORAL_TABLET | Freq: Once | ORAL | Status: AC
  Administered 2012-02-16: 1 via ORAL
  Filled 2012-02-16: qty 1

## 2012-02-16 NOTE — ED Notes (Signed)
[facility] called  

## 2012-02-16 NOTE — Discharge Instructions (Signed)
Your labwork today does not show a significant infection. Your urine does show white blood cells and red blood cells, however this is common with a indwelling Foley catheter. You do not have a fever today. We will start on antibiotics to cover for any infection. Your urine has been sent for culture. You will be called if you need to change your antibiotics. Please have the nursing home place a fan in your room to help with your sensation of feeling hot.

## 2012-02-16 NOTE — ED Notes (Signed)
Opened chart to verify address to return DNR.

## 2012-02-16 NOTE — ED Notes (Signed)
[id number]<XB> Expected date:<BR> Expected time:<BR> Means of arrival:<BR> Comments:<BR> Low temp, Hx of UTI 3 days ago, SNF [facility]

## 2012-02-16 NOTE — ED Notes (Signed)
Per EMS:  Pt was treated 3 days ago for a UTI and pt believes symptoms still persist.  Pt has foley, urine appears cloudy, pt is warm to touch and has fever.  Pt wanted to be re-evaluated.

## 2012-02-16 NOTE — ED Notes (Signed)
Pt presents with a  C/c of "feeling warm" and UTI.  Pt has a permanent foley and was treated for a UTI 3 days ago with IV Rocephin.  Pt st's he thinks he has another UTI, he feels warm and urine is cloudy.

## 2012-02-17 NOTE — ED Provider Notes (Signed)
History     CSN: [id number]  Arrival date & time 02/16/12  0220   First MD Initiated Contact with Patient 02/16/12 0244      Chief Complaint  Patient presents with  . Urinary Tract Infection    (Consider location/radiation/quality/duration/timing/severity/associated sxs/prior treatment) HPI 61 year old male presents from nursing home with complaint of feeling hot. Patient is very difficult to understand due to his dysarthria. Patient recently treated for urinary tract infection for 2 weeks with Rocephin. Patient has suprapubic catheter. Patient thinks that since he feels hot, he has another urinary tract infection. He has not had a fever. He has no nausea or vomiting diarrhea. He has no abdominal pain. No other complaints. Patient is requesting a fan to be placed in his room  Past Medical History  Diagnosis Date  . MS (multiple sclerosis)     bedridden since 1986  . Neurogenic bladder     with suprapubic catheter  . Dysphagia   . Personal history of PE (pulmonary embolism)     on coumadin  . Hypertension     Past Surgical History  Procedure Date  . Tonsillectomy and adenoidectomy   . Suprapubic catheter placement     11/11    No family history on file.  History  Substance Use Topics  . Smoking status: Never Smoker   . Smokeless tobacco: Not on file  . Alcohol Use: No      Review of Systems  Unable to perform ROS: Other   language barrier  Allergies  Review of patient's allergies indicates no known allergies.  Home Medications   Current Outpatient Rx  Name Route Sig Dispense Refill  . CALCIUM CARBONATE-VITAMIN D 600-400 MG-UNIT PO TABS Oral Take 1 tablet by mouth daily.    Marland Kitchen CITALOPRAM HYDROBROMIDE 20 MG PO TABS Oral Take 20 mg by mouth daily.    . CYCLOBENZAPRINE HCL 10 MG PO TABS Oral Take 10 mg by mouth 3 (three) times daily as needed. For muscle spasms    . METOPROLOL TARTRATE 25 MG PO TABS Oral Take 12.5 mg by mouth 2 (two) times daily.     . ADULT  MULTIVITAMIN W/MINERALS CH Oral Take 1 tablet by mouth daily.    . SULFAMETHOXAZOLE-TRIMETHOPRIM 800-160 MG PO TABS Oral Take 1 tablet by mouth 2 (two) times daily. 20 tablet 0    BP 177/107  Pulse 116  Temp(Src) 99.2 F (37.3 C) (Oral)  Resp 18  SpO2 100%  Physical Exam  Nursing note and vitals reviewed. Constitutional:       Chronically ill appearing but in no acute distress  HENT:  Head: Normocephalic and atraumatic.  Cardiovascular: Normal rate, regular rhythm, normal heart sounds and intact distal pulses.  Exam reveals no gallop and no friction rub.   No murmur heard. Pulmonary/Chest: Effort normal and breath sounds normal. No respiratory distress. He has no wheezes. He has no rales. He exhibits no tenderness.  Abdominal: Soft. Bowel sounds are normal. He exhibits no distension and no mass. There is no tenderness. There is no rebound and no guarding.       Suprapubic cath clean and dry no signs of infection at site  Skin: Skin is warm and dry.    ED Course  Procedures (including critical care time)  Labs Reviewed  URINALYSIS, ROUTINE W REFLEX MICROSCOPIC - Abnormal; Notable for the following:    APPearance CLOUDY (*)    Hgb urine dipstick LARGE (*)    Protein, ur 100 (*)  Leukocytes, UA MODERATE (*)    All other components within normal limits  URINE MICROSCOPIC-ADD ON - Abnormal; Notable for the following:    Bacteria, UA FEW (*)    All other components within normal limits  CBC - Abnormal; Notable for the following:    RDW 15.9 (*)    All other components within normal limits  DIFFERENTIAL  BASIC METABOLIC PANEL  URINE CULTURE   No results found.   1. URINARY TRACT INFECTION, RECURRENT       MDM  61 year old male with sensation of being hot. Patient has not had fever. Urine is grossly contaminated given indwelling catheter. Will send for culture. Prior cultures did show sensitivity to Bactrim, and will start on this for possible  infection        Olivia Mackie, MD 02/17/12 0730

## 2012-02-19 LAB — URINE CULTURE
Colony Count: 75000
Culture  Setup Time: 201305041120

## 2012-02-20 NOTE — ED Notes (Signed)
+   Urine Patient treated with bactrim-sensitive to same-chart appended per protocol MD. 

## 2012-04-14 ENCOUNTER — Inpatient Hospital Stay (HOSPITAL_COMMUNITY)
Admission: EM | Admit: 2012-04-14 | Discharge: 2012-04-18 | DRG: 689 | Disposition: A | Discharge status: Skilled Nursing Facility | Payer: Medicare Other | Attending: Internal Medicine | Admitting: Internal Medicine

## 2012-04-14 ENCOUNTER — Emergency Department (HOSPITAL_COMMUNITY): Payer: Medicare Other

## 2012-04-14 ENCOUNTER — Encounter (HOSPITAL_COMMUNITY): Payer: Self-pay

## 2012-04-14 DIAGNOSIS — R1310 Dysphagia, unspecified: Secondary | ICD-10-CM

## 2012-04-14 DIAGNOSIS — Z7901 Long term (current) use of anticoagulants: Secondary | ICD-10-CM

## 2012-04-14 DIAGNOSIS — R404 Transient alteration of awareness: Secondary | ICD-10-CM | POA: Diagnosis present

## 2012-04-14 DIAGNOSIS — N319 Neuromuscular dysfunction of bladder, unspecified: Secondary | ICD-10-CM | POA: Diagnosis present

## 2012-04-14 DIAGNOSIS — R509 Fever, unspecified: Secondary | ICD-10-CM

## 2012-04-14 DIAGNOSIS — L89109 Pressure ulcer of unspecified part of back, unspecified stage: Secondary | ICD-10-CM

## 2012-04-14 DIAGNOSIS — R634 Abnormal weight loss: Secondary | ICD-10-CM

## 2012-04-14 DIAGNOSIS — I10 Essential (primary) hypertension: Secondary | ICD-10-CM | POA: Diagnosis present

## 2012-04-14 DIAGNOSIS — Z593 Problems related to living in residential institution: Secondary | ICD-10-CM

## 2012-04-14 DIAGNOSIS — Z86718 Personal history of other venous thrombosis and embolism: Secondary | ICD-10-CM

## 2012-04-14 DIAGNOSIS — L8990 Pressure ulcer of unspecified site, unspecified stage: Secondary | ICD-10-CM | POA: Diagnosis present

## 2012-04-14 DIAGNOSIS — I2782 Chronic pulmonary embolism: Secondary | ICD-10-CM | POA: Diagnosis present

## 2012-04-14 DIAGNOSIS — G35 Multiple sclerosis: Secondary | ICD-10-CM | POA: Diagnosis present

## 2012-04-14 DIAGNOSIS — R4182 Altered mental status, unspecified: Secondary | ICD-10-CM

## 2012-04-14 DIAGNOSIS — N390 Urinary tract infection, site not specified: Principal | ICD-10-CM | POA: Diagnosis present

## 2012-04-14 DIAGNOSIS — G9340 Encephalopathy, unspecified: Secondary | ICD-10-CM | POA: Diagnosis present

## 2012-04-14 LAB — CBC
MCV: 87.1 fL (ref 78.0–100.0)
Platelets: 131 10*3/uL — ABNORMAL LOW (ref 150–400)
RBC: 5.52 MIL/uL (ref 4.22–5.81)
RDW: 15.8 % — ABNORMAL HIGH (ref 11.5–15.5)
WBC: 6.7 10*3/uL (ref 4.0–10.5)

## 2012-04-14 LAB — COMPREHENSIVE METABOLIC PANEL
ALT: 71 U/L — ABNORMAL HIGH (ref 0–53)
AST: 82 U/L — ABNORMAL HIGH (ref 0–37)
Alkaline Phosphatase: 107 U/L (ref 39–117)
CO2: 23 mEq/L (ref 19–32)
Chloride: 106 mEq/L (ref 96–112)
GFR calc Af Amer: >90 mL/min (ref >90)
GFR calc non Af Amer: 89 mL/min — ABNORMAL LOW (ref >90)
Glucose, Bld: 111 mg/dL — ABNORMAL HIGH (ref 70–99)
Potassium: 4.4 mEq/L (ref 3.5–5.1)
Sodium: 141 mEq/L (ref 135–145)
Total Bilirubin: 0.3 mg/dL (ref 0.3–1.2)

## 2012-04-14 LAB — PROTIME-INR: Prothrombin Time: 13.8 seconds (ref 11.6–15.2)

## 2012-04-14 LAB — DIFFERENTIAL
Basophils Absolute: 0 10*3/uL (ref 0.0–0.1)
Eosinophils Relative: 1 % (ref 0–5)
Lymphocytes Relative: 10 % — ABNORMAL LOW (ref 12–46)
Lymphs Abs: 0.7 10*3/uL (ref 0.7–4.0)
Neutro Abs: 5.5 10*3/uL (ref 1.7–7.7)

## 2012-04-14 LAB — CK TOTAL AND CKMB (NOT AT ARMC): Relative Index: 1 (ref 0.0–2.5)

## 2012-04-14 LAB — POCT I-STAT, CHEM 8
BUN: 14 mg/dL (ref 6–23)
Calcium, Ion: 1.23 mmol/L (ref 1.12–1.32)
Hemoglobin: 16.7 g/dL (ref 13.0–17.0)
Sodium: 141 mEq/L (ref 135–145)
TCO2: 23 mmol/L (ref 0–100)

## 2012-04-14 LAB — GLUCOSE, CAPILLARY: Glucose-Capillary: 110 mg/dL — ABNORMAL HIGH (ref 70–99)

## 2012-04-14 LAB — APTT: aPTT: 26 seconds (ref 24–37)

## 2012-04-14 MED ORDER — ACETAMINOPHEN 650 MG RE SUPP
650.0000 mg | Freq: Once | RECTAL | Status: AC
  Administered 2012-04-14: 650 mg via RECTAL
  Filled 2012-04-14: qty 1

## 2012-04-14 MED ORDER — SODIUM CHLORIDE 0.9 % IV BOLUS (SEPSIS)
1000.0000 mL | Freq: Once | INTRAVENOUS | Status: AC
  Administered 2012-04-14: 1000 mL via INTRAVENOUS

## 2012-04-14 NOTE — ED Notes (Signed)
IV team paged for IV start and lab draw  

## 2012-04-14 NOTE — ED Provider Notes (Signed)
History     CSN: [id number]  Arrival date & time 04/14/12  2001   First MD Initiated Contact with Patient 04/14/12 2003      Chief Complaint  Patient presents with  . Altered Mental Status    (Consider location/radiation/quality/duration/timing/severity/associated sxs/prior treatment) Patient is a 61 y.o. male presenting with altered mental status. The history is provided by the EMS personnel, medical records and the nursing home. The history is limited by the condition of the patient.  Altered Mental Status This is a new problem. The current episode started today. The problem occurs constantly. The problem has been unchanged. Pertinent negatives include no fever. Nothing aggravates the symptoms. He has tried nothing for the symptoms. The treatment provided no relief.    Past Medical History  Diagnosis Date  . MS (multiple sclerosis)     bedridden since 1986  . Neurogenic bladder     with suprapubic catheter  . Dysphagia   . Personal history of PE (pulmonary embolism)     on coumadin  . Hypertension     Past Surgical History  Procedure Date  . Tonsillectomy and adenoidectomy   . Suprapubic catheter placement     11/11    No family history on file.  History  Substance Use Topics  . Smoking status: Never Smoker   . Smokeless tobacco: Not on file  . Alcohol Use: No      Review of Systems  Unable to perform ROS: Dementia  Constitutional: Negative for fever.  HENT: Positive for trouble swallowing and voice change.   Psychiatric/Behavioral: Positive for altered mental status.    Allergies  Review of patient's allergies indicates no known allergies.  Home Medications   Current Outpatient Rx  Name Route Sig Dispense Refill  . CALCIUM CARBONATE-VITAMIN D 600-400 MG-UNIT PO TABS Oral Take 1 tablet by mouth daily.    Marland Kitchen CITALOPRAM HYDROBROMIDE 20 MG PO TABS Oral Take 20 mg by mouth daily.    . CYCLOBENZAPRINE HCL 10 MG PO TABS Oral Take 10 mg by mouth 3 (three)  times daily as needed. For muscle spasms    . METOPROLOL TARTRATE 25 MG PO TABS Oral Take 12.5 mg by mouth 2 (two) times daily.     . ADULT MULTIVITAMIN W/MINERALS CH Oral Take 1 tablet by mouth daily.      BP 143/85  Pulse 116  Temp 102 F (38.9 C) (Rectal)  Resp 20  SpO2 99%  Physical Exam  Nursing note and vitals reviewed. Constitutional: He appears well-developed and well-nourished.  HENT:  Head: Normocephalic and atraumatic.  Eyes: EOM are normal. Pupils are equal, round, and reactive to light.  Cardiovascular: Normal rate and regular rhythm.   Pulmonary/Chest: Effort normal and breath sounds normal. No respiratory distress.  Abdominal: Soft. Bowel sounds are normal. He exhibits no distension. There is no tenderness.  Genitourinary:       Suprapubic cath in place, no obvious signs surrounding infection, cloudy, sedimentous urine.  Neurological: He is alert. He is disoriented.       Contractures x4, severe diffuse muscle weakness. Mild slurring of speech, no facial droop.  Skin: Skin is warm and dry.  Psychiatric: He has a normal mood and affect.    ED Course  Procedures (including critical care time)  Labs Reviewed  CBC - Abnormal; Notable for the following:    RDW 15.8 (*)     Platelets 131 (*)     All other components within normal limits  DIFFERENTIAL -  Abnormal; Notable for the following:    Neutrophils Relative 82 (*)     Lymphocytes Relative 10 (*)     All other components within normal limits  COMPREHENSIVE METABOLIC PANEL - Abnormal; Notable for the following:    Glucose, Bld 111 (*)     AST 82 (*)     ALT 71 (*)     GFR calc non Af Amer 89 (*)     All other components within normal limits  URINALYSIS, ROUTINE W REFLEX MICROSCOPIC - Abnormal; Notable for the following:    APPearance CLOUDY (*)     Hgb urine dipstick SMALL (*)     Leukocytes, UA MODERATE (*)     All other components within normal limits  GLUCOSE, CAPILLARY - Abnormal; Notable for the  following:    Glucose-Capillary 110 (*)     All other components within normal limits  POCT I-STAT, CHEM 8 - Abnormal; Notable for the following:    Glucose, Bld 113 (*)     All other components within normal limits  URINE MICROSCOPIC-ADD ON - Abnormal; Notable for the following:    Bacteria, UA FEW (*)     All other components within normal limits  PROTIME-INR  APTT  CK TOTAL AND CKMB  TROPONIN I  GRAM STAIN  URINE CULTURE   Ct Head Wo Contrast  04/14/2012  *RADIOLOGY REPORT*  Clinical Data: Altered mental status.  CT HEAD WITHOUT CONTRAST  Technique:  Contiguous axial images were obtained from the base of the skull through the vertex without contrast.  Comparison: 10/26/2005.  Findings: Stable mild age advanced atrophy and patchy periventricular white matter disease.  No extra-axial fluid collections.  No CT findings for acute hemispheric infarction and/or intracranial hemorrhage.  The brainstem and cerebellum appear normal and stable.  The bony structures are intact.  The paranasal sinuses and mastoid air cells are clear.  IMPRESSION: Stable age advanced cerebral atrophy. No acute intracranial findings.  Original Report Authenticated By: P. Loralie Champagne, M.D.   Dg Chest Port 1 View  04/14/2012  *RADIOLOGY REPORT*  Clinical Data: Fever, altered mental status.  PORTABLE CHEST - 1 VIEW  Comparison: 07/16/2011  Findings: Left base atelectasis.  Right lung is clear.  Heart is upper limits normal in size.  No effusions.  No acute bony abnormality.  IMPRESSION: Left base atelectasis.  Original Report Authenticated By: Cyndie Chime, M.D.     1. UTI (urinary tract infection)   2. Altered mental status   3. Febrile       MDM  61 yo M with hx severe MS (per past notes at baseline has chronic contractures and generalized weakness) was last seen normal at NH at ~1640. They note that ~1700 they tried to feed him dinner, but he was unable to swallow. EMS was called, and pt transported for  further evaluation. Code stroke was called pre-hospitally due to acute onset of neuro deficit within 3 hours. On exam, pt verbal with slightly slurred speech, no obvious facial droops, generalized weakness- minimal movement in all extremities with contractures x4. Pt warm to the touch; rectal temp = 102. Without any obvious neuro deficits on exam and current fever, do not think current sxs are due to ischemic stroke. Has hx of frequent UTIs; feel that is more likely to be the etiology of pt's symptoms. Has indwelling suprapubic catheter, which has cloudy, sedimentous urine in it. Will change out catheter, check labs, CXR to evaluate for source of fever.  CXR, CT head unremarkable. UA suggestive of recurrent UTI. Will treat with rocephin. Pt to be admitted to medicine for further treatment.    Theotis Burrow, MD 04/15/12 [id number]

## 2012-04-14 NOTE — ED Notes (Signed)
Patient presents from [facility].  Staff reporting LSN was 1640, staff noted at 1700 that patient was unable to swallow and became aphasic.  Patient following commands, unable to speak, patient has a foley catheter in place and was noted at nursing home to have had a temperature of 101

## 2012-04-14 NOTE — Code Documentation (Signed)
Patient arrived via EMS from [facility] after being found with difficulty swallowing and less speech by staff. Code stroke called at 1948, patient arrived at 66, EDP exam at 1955, stroke team arrived at 2002, [place] at 1640, stroke like symptoms noted at 1700, patient arrived to CT at 1958, phlebotomist arrived at 1955, CT read at 2010. Code stroke cancelled at 2012 by EDP. Patient following commands, moving extremities at baseline strength for patient, answering questions appropriately. Patient has history of MS, contractures/decreased muscle tone, frequent UTIs. Foley present from center with dark cloudy urine with odor and sediment, patient has temp of 102 rectal.

## 2012-04-14 NOTE — ED Provider Notes (Signed)
  I performed a history and physical examination of [name] and discussed his management with Dr. Leary Roca.  I agree with the history, physical, assessment, and plan of care, with the following exceptions: None  I was present for the following procedures: None Time Spent in Critical Care of the patient: None Time spent in discussions with the patient and family: 10  Marlis Oldaker Corlis Leak, MD 04/14/12 2212

## 2012-04-15 ENCOUNTER — Encounter (HOSPITAL_COMMUNITY): Payer: Self-pay | Admitting: Internal Medicine

## 2012-04-15 DIAGNOSIS — G35 Multiple sclerosis: Secondary | ICD-10-CM

## 2012-04-15 DIAGNOSIS — N319 Neuromuscular dysfunction of bladder, unspecified: Secondary | ICD-10-CM

## 2012-04-15 DIAGNOSIS — I10 Essential (primary) hypertension: Secondary | ICD-10-CM

## 2012-04-15 DIAGNOSIS — G9340 Encephalopathy, unspecified: Secondary | ICD-10-CM | POA: Diagnosis present

## 2012-04-15 DIAGNOSIS — N390 Urinary tract infection, site not specified: Principal | ICD-10-CM

## 2012-04-15 LAB — URINALYSIS, ROUTINE W REFLEX MICROSCOPIC
Glucose, UA: NEGATIVE mg/dL
Ketones, ur: NEGATIVE mg/dL
Nitrite: NEGATIVE
Protein, ur: NEGATIVE mg/dL
Urobilinogen, UA: 1 mg/dL (ref 0.0–1.0)

## 2012-04-15 LAB — GRAM STAIN

## 2012-04-15 LAB — CBC
HCT: 43.8 % (ref 39.0–52.0)
MCH: 29.6 pg (ref 26.0–34.0)
MCV: 86.4 fL (ref 78.0–100.0)
Platelets: 134 10*3/uL — ABNORMAL LOW (ref 150–400)
RBC: 5.07 MIL/uL (ref 4.22–5.81)

## 2012-04-15 LAB — MRSA PCR SCREENING: MRSA by PCR: NEGATIVE

## 2012-04-15 LAB — CREATININE, SERUM: GFR calc Af Amer: >90 mL/min (ref >90)

## 2012-04-15 LAB — URINE MICROSCOPIC-ADD ON

## 2012-04-15 MED ORDER — CYCLOBENZAPRINE HCL 10 MG PO TABS: 10.0000 mg | ORAL_TABLET | Freq: Three times a day (TID) | ORAL | Status: DC | PRN

## 2012-04-15 MED ORDER — DEXTROSE 5 % IV SOLN
1.0000 g | Freq: Once | INTRAVENOUS | Status: AC
  Administered 2012-04-15: 1 g via INTRAVENOUS
  Filled 2012-04-15: qty 10

## 2012-04-15 MED ORDER — PRO-STAT SUGAR FREE PO LIQD
30.0000 mL | Freq: Every day | ORAL | Status: DC
  Administered 2012-04-16 – 2012-04-18 (×3): 30 mL via ORAL
  Filled 2012-04-15 (×4): qty 30

## 2012-04-15 MED ORDER — ONDANSETRON HCL 4 MG/2ML IJ SOLN: 4.0000 mg | Freq: Four times a day (QID) | INTRAMUSCULAR | Status: DC | PRN

## 2012-04-15 MED ORDER — METOPROLOL TARTRATE 12.5 MG HALF TABLET
12.5000 mg | ORAL_TABLET | Freq: Two times a day (BID) | ORAL | Status: DC
  Administered 2012-04-15 – 2012-04-18 (×7): 12.5 mg via ORAL
  Filled 2012-04-15 (×8): qty 1

## 2012-04-15 MED ORDER — SODIUM CHLORIDE 0.9 % IV SOLN
INTRAVENOUS | Status: DC
  Administered 2012-04-15: 100 mL/h via INTRAVENOUS
  Administered 2012-04-15: 17:00:00 via INTRAVENOUS
  Administered 2012-04-16: 1000 mL via INTRAVENOUS
  Administered 2012-04-16: 18:00:00 via INTRAVENOUS
  Administered 2012-04-17: 1000 mL via INTRAVENOUS
  Administered 2012-04-17: 22:00:00 via INTRAVENOUS

## 2012-04-15 MED ORDER — ENOXAPARIN SODIUM 40 MG/0.4ML ~~LOC~~ SOLN
40.0000 mg | Freq: Every day | SUBCUTANEOUS | Status: DC
  Administered 2012-04-15 – 2012-04-18 (×4): 40 mg via SUBCUTANEOUS
  Filled 2012-04-15 (×4): qty 0.4

## 2012-04-15 MED ORDER — ADULT MULTIVITAMIN W/MINERALS CH
1.0000 | ORAL_TABLET | Freq: Every day | ORAL | Status: DC
  Administered 2012-04-15 – 2012-04-18 (×4): 1 via ORAL
  Filled 2012-04-15 (×4): qty 1

## 2012-04-15 MED ORDER — ONDANSETRON HCL 4 MG PO TABS: 4.0000 mg | ORAL_TABLET | Freq: Four times a day (QID) | ORAL | Status: DC | PRN

## 2012-04-15 MED ORDER — ACETAMINOPHEN 650 MG RE SUPP: 650.0000 mg | Freq: Four times a day (QID) | RECTAL | Status: DC | PRN

## 2012-04-15 MED ORDER — ACETAMINOPHEN 325 MG PO TABS: 650.0000 mg | ORAL_TABLET | Freq: Four times a day (QID) | ORAL | Status: DC | PRN

## 2012-04-15 MED ORDER — CITALOPRAM HYDROBROMIDE 20 MG PO TABS
20.0000 mg | ORAL_TABLET | Freq: Every day | ORAL | Status: DC
  Administered 2012-04-15 – 2012-04-17 (×3): 20 mg via ORAL
  Filled 2012-04-15 (×3): qty 1

## 2012-04-15 MED ORDER — SODIUM CHLORIDE 0.9 % IJ SOLN
3.0000 mL | Freq: Two times a day (BID) | INTRAMUSCULAR | Status: DC
  Administered 2012-04-16 – 2012-04-18 (×4): 3 mL via INTRAVENOUS

## 2012-04-15 MED ORDER — SODIUM CHLORIDE 0.9 % IV SOLN
250.0000 mg | Freq: Four times a day (QID) | INTRAVENOUS | Status: DC
  Administered 2012-04-15 – 2012-04-18 (×14): 250 mg via INTRAVENOUS
  Filled 2012-04-15 (×17): qty 250

## 2012-04-15 NOTE — H&P (Signed)
[name] is an 61 y.o. male.   PCP - Dr.Harvette Lovell Sheehan. Chief Complaint: Fever with altered mental status. HPI: 61 year old male with history of multiple sclerosis and suprapubic catheter has had recurrent urine tract infections was brought from the nursing home because of increasing confusion fever. When patient was initially brought he was an altered mental status but by the time I examined he has become more alert awake and oriented. He states he was and fever for last 2 days and denies any cough phlegm shortness of breath or any abdominal pain nausea vomiting or diarrhea. In the ER he was found to have a urinalysis compatible with UTI which most likely is the cause of his symptoms.  Past Medical History  Diagnosis Date  . MS (multiple sclerosis)     bedridden since 1986  . Neurogenic bladder     with suprapubic catheter  . Dysphagia   . Personal history of PE (pulmonary embolism)     on coumadin  . Hypertension     Past Surgical History  Procedure Date  . Tonsillectomy and adenoidectomy   . Suprapubic catheter placement     11/11    History reviewed. No pertinent family history. Social History:  reports that he has never smoked. He does not have any smokeless tobacco history on file. He reports that he does not drink alcohol or use illicit drugs.  Allergies: No Known Allergies   (Not in a hospital admission)  Results for orders placed during the hospital encounter of 04/14/12 (from the past 48 hour(s))  GLUCOSE, CAPILLARY     Status: Abnormal   Collection Time   04/14/12  8:32 PM      Component Value Range Comment   Glucose-Capillary 110 (*) 70 - 99 mg/dL   PROTIME-INR     Status: Normal   Collection Time   04/14/12  8:46 PM      Component Value Range Comment   Prothrombin Time 13.8  11.6 - 15.2 seconds    INR 1.04  0.00 - 1.49   APTT     Status: Normal   Collection Time   04/14/12  8:46 PM      Component Value Range Comment   aPTT 26  24 - 37 seconds   CBC      Status: Abnormal   Collection Time   04/14/12  8:46 PM      Component Value Range Comment   WBC 6.7  4.0 - 10.5 K/uL    RBC 5.52  4.22 - 5.81 MIL/uL    Hemoglobin 16.2  13.0 - 17.0 g/dL    HCT 16.1  09.6 - 04.5 %    MCV 87.1  78.0 - 100.0 fL    MCH 29.3  26.0 - 34.0 pg    MCHC 33.7  30.0 - 36.0 g/dL    RDW 40.9 (*) 81.1 - 15.5 %    Platelets 131 (*) 150 - 400 K/uL   DIFFERENTIAL     Status: Abnormal   Collection Time   04/14/12  8:46 PM      Component Value Range Comment   Neutrophils Relative 82 (*) 43 - 77 %    Neutro Abs 5.5  1.7 - 7.7 K/uL    Lymphocytes Relative 10 (*) 12 - 46 %    Lymphs Abs 0.7  0.7 - 4.0 K/uL    Monocytes Relative 8  3 - 12 %    Monocytes Absolute 0.5  0.1 - 1.0 K/uL  Eosinophils Relative 1  0 - 5 %    Eosinophils Absolute 0.0  0.0 - 0.7 K/uL    Basophils Relative 0  0 - 1 %    Basophils Absolute 0.0  0.0 - 0.1 K/uL   COMPREHENSIVE METABOLIC PANEL     Status: Abnormal   Collection Time   04/14/12  8:46 PM      Component Value Range Comment   Sodium 141  135 - 145 mEq/L    Potassium 4.4  3.5 - 5.1 mEq/L    Chloride 106  96 - 112 mEq/L    CO2 23  19 - 32 mEq/L    Glucose, Bld 111 (*) 70 - 99 mg/dL    BUN 13  6 - 23 mg/dL    Creatinine, Ser 4.09  0.50 - 1.35 mg/dL    Calcium 9.6  8.4 - 81.1 mg/dL    Total Protein 8.0  6.0 - 8.3 g/dL    Albumin 3.5  3.5 - 5.2 g/dL    AST 82 (*) 0 - 37 U/L    ALT 71 (*) 0 - 53 U/L    Alkaline Phosphatase 107  39 - 117 U/L    Total Bilirubin 0.3  0.3 - 1.2 mg/dL    GFR calc non Af Amer 89 (*) >90 mL/min    GFR calc Af Amer >90  >90 mL/min   CK TOTAL AND CKMB     Status: Normal   Collection Time   04/14/12  8:46 PM      Component Value Range Comment   Total CK 200  7 - 232 U/L    CK, MB 1.9  0.3 - 4.0 ng/mL    Relative Index 1.0  0.0 - 2.5   TROPONIN I     Status: Normal   Collection Time   04/14/12  8:46 PM      Component Value Range Comment   Troponin I <0.30  <0.30 ng/mL   POCT I-STAT, CHEM 8     Status:  Abnormal   Collection Time   04/14/12  8:59 PM      Component Value Range Comment   Sodium 141  135 - 145 mEq/L    Potassium 4.5  3.5 - 5.1 mEq/L    Chloride 109  96 - 112 mEq/L    BUN 14  6 - 23 mg/dL    Creatinine, Ser 9.14  0.50 - 1.35 mg/dL    Glucose, Bld 782 (*) 70 - 99 mg/dL    Calcium, Ion 9.56  2.13 - 1.32 mmol/L    TCO2 23  0 - 100 mmol/L    Hemoglobin 16.7  13.0 - 17.0 g/dL    HCT 08.6  57.8 - 46.9 %   URINALYSIS, ROUTINE W REFLEX MICROSCOPIC     Status: Abnormal   Collection Time   04/15/12 12:33 AM      Component Value Range Comment   Color, Urine YELLOW  YELLOW    APPearance CLOUDY (*) CLEAR    Specific Gravity, Urine 1.025  1.005 - 1.030    pH 6.5  5.0 - 8.0    Glucose, UA NEGATIVE  NEGATIVE mg/dL    Hgb urine dipstick SMALL (*) NEGATIVE    Bilirubin Urine NEGATIVE  NEGATIVE    Ketones, ur NEGATIVE  NEGATIVE mg/dL    Protein, ur NEGATIVE  NEGATIVE mg/dL    Urobilinogen, UA 1.0  0.0 - 1.0 mg/dL    Nitrite NEGATIVE  NEGATIVE  Leukocytes, UA MODERATE (*) NEGATIVE   GRAM STAIN     Status: Normal   Collection Time   04/15/12 12:33 AM      Component Value Range Comment   Specimen Description URINE, CATHETERIZED      Special Requests NONE      Gram Stain        Value: CYTOSPIN SLIDE     WBC PRESENT,BOTH PMN AND MONONUCLEAR     GRAM POSITIVE COCCI IN PAIRS IN CLUSTERS   Report Status 04/15/2012 FINAL     URINE MICROSCOPIC-ADD ON     Status: Abnormal   Collection Time   04/15/12 12:33 AM      Component Value Range Comment   Squamous Epithelial / LPF RARE  RARE    WBC, UA 11-20  <3 WBC/hpf    RBC / HPF 3-6  <3 RBC/hpf    Bacteria, UA FEW (*) RARE    Ct Head Wo Contrast  04/14/2012  *RADIOLOGY REPORT*  Clinical Data: Altered mental status.  CT HEAD WITHOUT CONTRAST  Technique:  Contiguous axial images were obtained from the base of the skull through the vertex without contrast.  Comparison: 10/26/2005.  Findings: Stable mild age advanced atrophy and patchy  periventricular white matter disease.  No extra-axial fluid collections.  No CT findings for acute hemispheric infarction and/or intracranial hemorrhage.  The brainstem and cerebellum appear normal and stable.  The bony structures are intact.  The paranasal sinuses and mastoid air cells are clear.  IMPRESSION: Stable age advanced cerebral atrophy. No acute intracranial findings.  Original Report Authenticated By: P. Loralie Champagne, M.D.   Dg Chest Port 1 View  04/14/2012  *RADIOLOGY REPORT*  Clinical Data: Fever, altered mental status.  PORTABLE CHEST - 1 VIEW  Comparison: 07/16/2011  Findings: Left base atelectasis.  Right lung is clear.  Heart is upper limits normal in size.  No effusions.  No acute bony abnormality.  IMPRESSION: Left base atelectasis.  Original Report Authenticated By: Cyndie Chime, M.D.    Review of Systems  Constitutional: Positive for fever and chills.  HENT: Negative.   Eyes: Negative.   Respiratory: Negative.   Cardiovascular: Negative.   Gastrointestinal: Negative.   Genitourinary: Negative.   Musculoskeletal: Negative.   Skin: Negative.   Neurological:       Confusion.  Endo/Heme/Allergies: Negative.   Psychiatric/Behavioral: Negative.     Blood pressure 135/75, pulse 88, temperature 102 F (38.9 C), temperature source Rectal, resp. rate 20, SpO2 99.00%. Physical Exam  Constitutional: He is oriented to person, place, and time. He appears well-developed and well-nourished. No distress.  HENT:  Head: Normocephalic and atraumatic.  Right Ear: External ear normal.  Left Ear: External ear normal.  Nose: Nose normal.  Mouth/Throat: Oropharynx is clear and moist. No oropharyngeal exudate.  Eyes: Conjunctivae are normal. Pupils are equal, round, and reactive to light. Right eye exhibits no discharge. Left eye exhibits no discharge. No scleral icterus.  Neck: Normal range of motion. Neck supple.  Cardiovascular: Normal rate and regular rhythm.   Respiratory:  Effort normal and breath sounds normal.  GI: Soft. Bowel sounds are normal. There is no tenderness. There is no rebound.       Suprapubic catheter.  Musculoskeletal: He exhibits no edema.  Neurological: He is alert and oriented to person, place, and time.       Has contracture both upper and lower extremities.  Skin: Skin is warm and dry. He is not diaphoretic.  Assessment/Plan #1. Acute encephalopathy most likely from UTI. #2. History of recurrent UTI - I have placed patient on Primaxin. Check urine cultures and further antibiotics accordingly. His suprapubic catheter was replaced in the ER. There is mild discharge around the catheter. #3. History of multiple sclerosis. #4. History of hypertension. #5. History of pulmonary embolism.  CODE STATUS - full code.  Queenie Aufiero N. 04/15/2012, 3:42 AM

## 2012-04-15 NOTE — Progress Notes (Signed)
RN saw that patient came from facility with yellow DNR sheet. Asked patient if it was his wish for his DNR status to be continued here, patient responded "yes." MD called, received order to DC full code status and implement DNR status.  Minor, Yvette Rack

## 2012-04-15 NOTE — Progress Notes (Signed)
ANTIBIOTIC CONSULT NOTE - INITIAL  Pharmacy Consult for Primaxin Indication: UTI (h/o recurrent UTIs)  No Known Allergies  Patient Measurements: Height: 5\' 11"  (180.3 cm) Weight: 148 lb 5.9 oz (67.3 kg) IBW/kg (Calculated) : 75.3   Vital Signs: Temp: 98.3 F (36.8 C) (07/02 0556) Temp src: Oral (07/02 0556) BP: 143/90 mmHg (07/02 0556) Pulse Rate: 86  (07/02 0556) Intake/Output from previous day:   Intake/Output from this shift:    Labs:  [facility] 04/14/12 2059 04/14/12 2046  WBC -- 6.7  HGB 16.7 16.2  PLT -- 131*  LABCREA -- --  CREATININE 1.10 0.97   Estimated Creatinine Clearance: 68.8 ml/min (by C-G formula based on Cr of 1.1). No results found for this basename: VANCOTROUGH:2,VANCOPEAK:2,VANCORANDOM:2,GENTTROUGH:2,GENTPEAK:2,GENTRANDOM:2,TOBRATROUGH:2,TOBRAPEAK:2,TOBRARND:2,AMIKACINPEAK:2,AMIKACINTROU:2,AMIKACIN:2, in the last 72 hours   Microbiology: Recent Results (from the past 720 hour(s))  GRAM STAIN     Status: Normal   Collection Time   04/15/12 12:33 AM      Component Value Range Status Comment   Specimen Description URINE, CATHETERIZED   Final    Special Requests NONE   Final    Gram Stain     Final    Value: CYTOSPIN SLIDE     WBC PRESENT,BOTH PMN AND MONONUCLEAR     GRAM POSITIVE COCCI IN PAIRS IN CLUSTERS   Report Status 04/15/2012 FINAL   Final     Medical History: Past Medical History  Diagnosis Date  . MS (multiple sclerosis)     bedridden since 1986  . Neurogenic bladder     with suprapubic catheter  . Dysphagia   . Personal history of PE (pulmonary embolism)     on coumadin  . Hypertension     Medications:  Prescriptions prior to admission  Medication Sig Dispense Refill  . Calcium Carbonate-Vitamin D (CALTRATE 600+D) 600-400 MG-UNIT per tablet Take 1 tablet by mouth daily.      . citalopram (CELEXA) 20 MG tablet Take 20 mg by mouth daily.      . cyclobenzaprine (FLEXERIL) 10 MG tablet Take 10 mg by mouth 3 (three) times daily as  needed. For muscle spasms      . metoprolol tartrate (LOPRESSOR) 25 MG tablet Take 12.5 mg by mouth 2 (two) times daily.       . Multiple Vitamin (MULITIVITAMIN WITH MINERALS) TABS Take 1 tablet by mouth daily.       Assessment: 61 y.o. male presents with AMS - U/A compatible with UTI. To begin primaxin. Urine cx pending.  Goal of Therapy:  Eradication of infection  Plan:  1. Primaxin 250mg  IV q6h. First dose now. 2. Will f/u microbiological data and renal function  Christoper Fabian, PharmD, BCPS Clinical pharmacist, pager [phone] 04/15/2012,6:07 AM

## 2012-04-15 NOTE — Progress Notes (Signed)
INITIAL ADULT NUTRITION ASSESSMENT Date: 04/15/2012   Time: 10:44 AM Reason for Assessment: Nutrition Risk - Dependent   INTERVENTION:  30 ml Prostat po daily, will provide 100 kcal and 15 grams of protein  ASSESSMENT: Male 61 y.o.  Dx: Encephalopathy acute  Hx:  Past Medical History  Diagnosis Date  . MS (multiple sclerosis)     bedridden since 1986  . Neurogenic bladder     with suprapubic catheter  . Dysphagia   . Personal history of PE (pulmonary embolism)     on coumadin  . Hypertension    Past Surgical History  Procedure Date  . Tonsillectomy and adenoidectomy   . Suprapubic catheter placement     11/11    Related Meds:     . acetaminophen  650 mg Rectal Once  . cefTRIAXone (ROCEPHIN)  IV  1 g Intravenous Once  . citalopram  20 mg Oral Daily  . enoxaparin  40 mg Subcutaneous Daily  . imipenem-cilastatin  250 mg Intravenous Q6H  . metoprolol tartrate  12.5 mg Oral BID  . multivitamin with minerals  1 tablet Oral Daily  . sodium chloride  1,000 mL Intravenous Once  . sodium chloride  3 mL Intravenous Q12H    Ht: 5\' 11"  (180.3 cm)  Wt: 148 lb 5.9 oz (67.3 kg)  Ideal Wt: 70.2 kg % Ideal Wt: 96%  Usual Wt: 142 lbs Wt Readings from Last 10 Encounters:  04/15/12 148 lb 5.9 oz (67.3 kg)  01/12/11 120 lb (54.432 kg)  01/08/11 117 lb 12.8 oz (53.434 kg)  10/18/10 123 lb (55.792 kg)    % Usual Wt: 100%  Body mass index is 20.69 kg/(m^2).  Food/Nutrition Related Hx: per pt he has had no recent wt changes PTA. Pt reports good appetite. Pt must be fed as he does not have use of his arms.  Labs:  CMP     Component Value Date/Time   NA 141 04/14/2012 2059   K 4.5 04/14/2012 2059   CL 109 04/14/2012 2059   CO2 23 04/14/2012 2046   GLUCOSE 113* 04/14/2012 2059   BUN 14 04/14/2012 2059   CREATININE 0.84 04/15/2012 0844   CALCIUM 9.6 04/14/2012 2046   PROT 8.0 04/14/2012 2046   ALBUMIN 3.5 04/14/2012 2046   AST 82* 04/14/2012 2046   ALT 71* 04/14/2012 2046   ALKPHOS 107  04/14/2012 2046   BILITOT 0.3 04/14/2012 2046   GFRNONAA >90 04/15/2012 0844   GFRAA >90 04/15/2012 0844   CBG (last 3)   Basename 04/14/12 2032  GLUCAP 110*    Intake/Output Summary (Last 24 hours) at 04/15/12 1611 Last data filed at 04/15/12 1515  Gross per 24 hour  Intake    863 ml  Output    350 ml  Net    513 ml    Diet Order: Heart Healthy  Supplements/Tube Feeding: none  IVF:    sodium chloride Last Rate: 100 mL/hr (04/15/12 1610)   Pt with stage 2 wound on his sacrum per pt's RN. Per pt wound has been there a long time.   Estimated Nutritional Needs:   Kcal:  1500-1700 Protein:  90-110 grams Fluid:  >2 Liters/day  NUTRITION DIAGNOSIS: -Increased nutrient needs (NI-5.1).  Status: Ongoing  RELATED TO: wound healing  AS EVIDENCE BY: estimated protein needs of >1.3 grams/kg  MONITORING/EVALUATION(Goals): Goal: Pt to meet >/= 90% of their estimated nutrition needs. Monitor: po intake, wound, weight  EDUCATION NEEDS: -No education needs identified at this  time     DOCUMENTATION CODES Per approved criteria  -Not Applicable    Kendell Bane RD, LDN, CNSC [phone] Pager [phone] After Hours Pager  04/15/2012, 10:44 AM

## 2012-04-15 NOTE — ED Notes (Signed)
Pt awaiting assessment by admission MD for transport to floor. Pt needs admission orders.

## 2012-04-15 NOTE — Progress Notes (Signed)
[facility] PROGRESS NOTE  ASHTEN PRATS YNW:[id number] DOB: 06-09-1951 DOA: 04/14/2012   Assessment/Plan: Patient Active Hospital Problem List: Encephalopathy acute (04/15/2012) -now resolved most likely etiology proven urinary tract infection. -Patient is alert or oriented x3   UTI (lower urinary tract infection) (04/15/2012): -Continue Primaxin, urine cultures pending vitals are stable.  -3 catheter had small purulent drainage.    Hypertension: -Will control continue current treatment.  Code Status: Full code Family Communication: Spouse [phone] Disposition Plan: Skilled nursing facility  Lambert Keto, MD  [facility] Pager [phone]  If 7PM-7AM, please contact night-coverage [email or web address] Password [email or web address] 04/15/2012, 8:57 AM   LOS: 1 day   Antibiotics: Primaxin 04/14/2012    Subjective: No complaints. He relates he feels much better.  Objective: Filed Vitals:   04/15/12 0230 04/15/12 0245 04/15/12 0357 04/15/12 0556  BP: 143/81 135/75 128/84 143/90  Pulse: 91 88 90 86  Temp:    98.3 F (36.8 C)  TempSrc:    Oral  Resp: 20 20 22 20   Height:    5\' 11"  (1.803 m)  Weight:    67.3 kg (148 lb 5.9 oz)  SpO2: 96% 99% 96% 100%   No intake or output data in the 24 hours ending 04/15/12 0857 Weight change:   Exam:  General: Alert, awake, oriented x3, in no acute distress.  HEENT: No bruits, no goiter.  Heart: Regular rate and rhythm, without murmurs, rubs, gallops.  Lungs: Good air movement, bilateral air movement.  Abdomen: Soft, nontender, nondistended, positive bowel sounds. Purulent drainage mild around suprapubic catheter    Data Reviewed: Basic Metabolic Panel:  Lab 04/14/12 4401 04/14/12 2046  NA 141 141  K 4.5 4.4  CL 109 106  CO2 -- 23  GLUCOSE 113* 111*  BUN 14 13  CREATININE 1.10 0.97  CALCIUM -- 9.6  MG -- --  PHOS -- --   Liver Function Tests:  Lab 04/14/12 2046  AST 82*  ALT 71*  ALKPHOS 107  BILITOT 0.3   PROT 8.0  ALBUMIN 3.5   No results found for this basename: LIPASE:5,AMYLASE:5 in the last 168 hours No results found for this basename: AMMONIA:5 in the last 168 hours CBC:  Lab 04/14/12 2059 04/14/12 2046  WBC -- 6.7  NEUTROABS -- 5.5  HGB 16.7 16.2  HCT 49.0 48.1  MCV -- 87.1  PLT -- 131*   Cardiac Enzymes:  Lab 04/14/12 2046  CKTOTAL 200  CKMB 1.9  CKMBINDEX --  TROPONINI <0.30   BNP: No components found with this basename: POCBNP:5 CBG:  Lab 04/14/12 2032  GLUCAP 110*    Recent Results (from the past 240 hour(s))  GRAM STAIN     Status: Normal   Collection Time   04/15/12 12:33 AM      Component Value Range Status Comment   Specimen Description URINE, CATHETERIZED   Final    Special Requests NONE   Final    Gram Stain     Final    Value: CYTOSPIN SLIDE     WBC PRESENT,BOTH PMN AND MONONUCLEAR     GRAM POSITIVE COCCI IN PAIRS IN CLUSTERS   Report Status 04/15/2012 FINAL   Final   MRSA PCR SCREENING     Status: Normal   Collection Time   04/15/12  6:08 AM      Component Value Range Status Comment   MRSA by PCR NEGATIVE  NEGATIVE Final      Studies: Ct Head Wo Contrast  04/14/2012  *  RADIOLOGY REPORT*  Clinical Data: Altered mental status.  CT HEAD WITHOUT CONTRAST  Technique:  Contiguous axial images were obtained from the base of the skull through the vertex without contrast.  Comparison: 10/26/2005.  Findings: Stable mild age advanced atrophy and patchy periventricular white matter disease.  No extra-axial fluid collections.  No CT findings for acute hemispheric infarction and/or intracranial hemorrhage.  The brainstem and cerebellum appear normal and stable.  The bony structures are intact.  The paranasal sinuses and mastoid air cells are clear.  IMPRESSION: Stable age advanced cerebral atrophy. No acute intracranial findings.  Original Report Authenticated By: P. Loralie Champagne, M.D.   Dg Chest Port 1 View  04/14/2012  *RADIOLOGY REPORT*  Clinical Data: Fever,  altered mental status.  PORTABLE CHEST - 1 VIEW  Comparison: 07/16/2011  Findings: Left base atelectasis.  Right lung is clear.  Heart is upper limits normal in size.  No effusions.  No acute bony abnormality.  IMPRESSION: Left base atelectasis.  Original Report Authenticated By: Cyndie Chime, M.D.    Scheduled Meds:   . acetaminophen  650 mg Rectal Once  . cefTRIAXone (ROCEPHIN)  IV  1 g Intravenous Once  . citalopram  20 mg Oral Daily  . enoxaparin  40 mg Subcutaneous Daily  . imipenem-cilastatin  250 mg Intravenous Q6H  . metoprolol tartrate  12.5 mg Oral BID  . multivitamin with minerals  1 tablet Oral Daily  . sodium chloride  1,000 mL Intravenous Once  . sodium chloride  3 mL Intravenous Q12H   Continuous Infusions:   . sodium chloride 100 mL/hr (04/15/12 1610)

## 2012-04-16 LAB — CBC WITH DIFFERENTIAL/PLATELET
Eosinophils Absolute: 0.2 10*3/uL (ref 0.0–0.7)
Eosinophils Relative: 3 % (ref 0–5)
Hemoglobin: 14.2 g/dL (ref 13.0–17.0)
Lymphocytes Relative: 24 % (ref 12–46)
Lymphs Abs: 1.6 10*3/uL (ref 0.7–4.0)
MCH: 28.9 pg (ref 26.0–34.0)
MCV: 86.8 fL (ref 78.0–100.0)
Monocytes Relative: 15 % — ABNORMAL HIGH (ref 3–12)
Neutrophils Relative %: 58 % (ref 43–77)
RBC: 4.91 MIL/uL (ref 4.22–5.81)
WBC: 6.6 10*3/uL (ref 4.0–10.5)

## 2012-04-16 LAB — COMPREHENSIVE METABOLIC PANEL
AST: 46 U/L — ABNORMAL HIGH (ref 0–37)
Albumin: 3 g/dL — ABNORMAL LOW (ref 3.5–5.2)
BUN: 12 mg/dL (ref 6–23)
Calcium: 8.7 mg/dL (ref 8.4–10.5)
Creatinine, Ser: 0.81 mg/dL (ref 0.50–1.35)

## 2012-04-16 NOTE — Progress Notes (Signed)
UR COMPLETED  

## 2012-04-16 NOTE — Care Management Note (Signed)
    Page 1 of 1   04/18/2012     4:11:41 PM   CARE MANAGEMENT NOTE 04/18/2012  Patient:  FILBERT, CRAZE   Account Number:  [id number]  Date Initiated:  04/16/2012  Documentation initiated by:  Onnie Boer  Subjective/Objective Assessment:   PT WAS ADMITTED WITH AMS, UTI     Action/Plan:   PROGRESSION OF CARE AND DISCHARGE PLANNING   Anticipated DC Date:  04/18/2012   Anticipated DC Plan:  SKILLED NURSING FACILITY  In-house referral  Clinical Social Worker  Clinical Social Worker      DC Planning Services  CM consult      Choice offered to / List presented to:             Status of service:  Completed, signed off Medicare Important Message given?   (If response is "NO", the following Medicare IM given date fields will be blank) Date Medicare IM given:   Date Additional Medicare IM given:    Discharge Disposition:  SKILLED NURSING FACILITY  Per UR Regulation:  Reviewed for med. necessity/level of care/duration of stay  If discussed at Long Length of Stay Meetings, dates discussed:    Comments:  04/18/12 Onnie Boer, RN, BSN 1611 PT WAS DC'D TO [facility]  04/16/12 Onnie Boer, RN,BSN 1119 PT WAS ADMITTED WITH AMS AND UTI.  PTA PT WAS AT [facility] AND PLANS TO RETURN AT DC.

## 2012-04-16 NOTE — Progress Notes (Signed)
[facility] PROGRESS NOTE  [name] MVH:[id number] DOB: 03-10-1951 DOA: 04/14/2012 PCP: No primary provider on file.   HPI/Subjective: "Feels better". Patinet feels like he is back to himself, no complaints. Denies pain or discomfort. Eating very well.  Objective: Filed Vitals:   04/15/12 1410 04/15/12 2200 04/16/12 0600 04/16/12 1438  BP: 112/68 111/61 140/81 153/84  Pulse: 95 86 84 97  Temp: 97.3 F (36.3 C) 98.1 F (36.7 C) 97.6 F (36.4 C) 97.6 F (36.4 C)  TempSrc: Oral Oral Oral   Resp: 20 20 20 20   Height:      Weight:      SpO2: 98% 99% 99% 97%    Intake/Output Summary (Last 24 hours) at 04/16/12 2226 Last data filed at 04/16/12 1859  Gross per 24 hour  Intake    140 ml  Output   1900 ml  Net  -1760 ml    Exam:   General:  AOx3, pleasant cooperative  Cardiovascular: RRR, no mrg  Respiratory: scattered upper airway rhonchi, cleared with cough  Abdomen: soft NT, suprapubic cath site looks normal, no purulence or drainage  Data Reviewed: Basic Metabolic Panel:  Lab 04/16/12 8413 04/15/12 0844 04/14/12 2059 04/14/12 2046  NA 138 -- 141 141  K 4.4 -- 4.5 4.4  CL 108 -- 109 106  CO2 20 -- -- 23  GLUCOSE 79 -- 113* 111*  BUN 12 -- 14 13  CREATININE 0.81 0.84 1.10 0.97  CALCIUM 8.7 -- -- 9.6  MG -- -- -- --  PHOS -- -- -- --   Liver Function Tests:  Lab 04/16/12 [facility] 04/14/12 2046  AST 46* 82*  ALT 60* 71*  ALKPHOS 86 107  BILITOT 0.2* 0.3  PROT 7.0 8.0  ALBUMIN 3.0* 3.5   No results found for this basename: LIPASE:5,AMYLASE:5 in the last 168 hours No results found for this basename: AMMONIA:5 in the last 168 hours CBC:  Lab 04/16/12 0622 04/15/12 0844 04/14/12 2059 04/14/12 2046  WBC 6.6 6.0 -- 6.7  NEUTROABS 3.8 -- -- 5.5  HGB 14.2 15.0 16.7 16.2  HCT 42.6 43.8 49.0 48.1  MCV 86.8 86.4 -- 87.1  PLT 146* 134* -- 131*   Cardiac Enzymes:  Lab 04/14/12 2046  CKTOTAL 200  CKMB 1.9  CKMBINDEX --  TROPONINI <0.30    Lab  04/14/12 2032  GLUCAP 110*    Recent Results (from the past 240 hour(s))  GRAM STAIN     Status: Normal   Collection Time   04/15/12 12:33 AM      Component Value Range Status Comment   Specimen Description URINE, CATHETERIZED   Final    Special Requests NONE   Final    Gram Stain     Final    Value: CYTOSPIN SLIDE     WBC PRESENT,BOTH PMN AND MONONUCLEAR     GRAM POSITIVE COCCI IN PAIRS IN CLUSTERS   Report Status 04/15/2012 FINAL   Final   URINE CULTURE     Status: Normal (Preliminary result)   Collection Time   04/15/12 12:33 AM      Component Value Range Status Comment   Specimen Description URINE, CATHETERIZED   Final    Special Requests NONE   Final    Culture  Setup Time 04/15/2012 01:14   Final    Colony Count PENDING   Incomplete    Culture Culture reincubated for better growth   Final    Report Status PENDING   Incomplete  MRSA PCR SCREENING     Status: Normal   Collection Time   04/15/12  6:08 AM      Component Value Range Status Comment   MRSA by PCR NEGATIVE  NEGATIVE Final      Studies: No results found.  Scheduled Meds:   . citalopram  20 mg Oral Daily  . enoxaparin  40 mg Subcutaneous Daily  . feeding supplement  30 mL Oral Daily  . imipenem-cilastatin  250 mg Intravenous Q6H  . metoprolol tartrate  12.5 mg Oral BID  . multivitamin with minerals  1 tablet Oral Daily  . sodium chloride  3 mL Intravenous Q12H   Continuous Infusions:   . sodium chloride 100 mL/hr at 04/16/12 1731     Assessment/Plan: 1. AMS/Delirium: secondary to UTI, now resolved, back to his baseline 2. UTI: Has indwelling SP cath, colonization likely but given symptoms and rapid improvement with treatment had active infection. On primaxin, previously sensitive urine/enterococcus and psuedomonas. Awaiting urine cultures, will continue for now, once cultures return will consider oral regimens for antibiotics  3. HTN: well controlled, continue current medications 4. MS: stable, moderate  to severe debility, total assist with ADLs.  Code Status: Full Code Family Communication: Discussed with patient only, spouse number [phone] Disposition Plan: retun to [facility] healthcare once culture results obtained.  Anderson Malta, MD  [facility] Pager [phone]  If 7PM-7AM, please contact night-coverage [email or web address] Password [email or web address] 04/16/2012, 10:26 PM   LOS: 2 days

## 2012-04-16 NOTE — Clinical Social Work Psychosocial (Signed)
     Clinical Social Work Department BRIEF PSYCHOSOCIAL ASSESSMENT 04/16/2012  Patient:  [name], [name]     Account Number:  [id number]     Admit date:  04/14/2012  Clinical Social Worker:  Peggyann Shoals  Date/Time:  04/16/2012 05:58 PM  Referred by:  RN  Date Referred:  04/15/2012 Referred for  Other - See comment   Other Referral:   Pt was admitted from [facility]   Interview type:  Patient Other interview type:    PSYCHOSOCIAL DATA Living Status:  FACILITY Admitted from facility:  [facility] Level of care:  Skilled Nursing Facility Primary support name:  [name] Primary support relationship to patient:  CHILD, ADULT Degree of support available:   Adequate    CURRENT CONCERNS Current Concerns  Post-Acute Placement   Other Concerns:    SOCIAL WORK ASSESSMENT / PLAN CSW met with pt to address consult. CSW introduced herself and explained role of social work. Pt is a long term resident of [facility]. Pt shared that he would like to return when he is discharged. Pt is agreeable to CSW contacting pt's daughter.    CSW recieved phone call from [facility] to follow up. Per facility, they are able to accept pt when he is discharged.    CSW will complete needed paperwork and continue to follow to facilitate discharge to SNF.   Assessment/plan status:  Psychosocial Support/Ongoing Assessment of Needs Other assessment/ plan:   Information/referral to community resources:   Pt is linked with [facility].    PATIENTS/FAMILYS RESPONSE TO PLAN OF CARE: Pt was very pleasant and alert and oriented. Pt is agreeable to discharge plan.

## 2012-04-17 ENCOUNTER — Inpatient Hospital Stay (HOSPITAL_COMMUNITY): Payer: Medicare Other

## 2012-04-17 LAB — URINE CULTURE: Colony Count: >=100000

## 2012-04-17 MED ORDER — CITALOPRAM HYDROBROMIDE 20 MG PO TABS: 20.0000 mg | ORAL_TABLET | Freq: Every day | ORAL | Status: DC

## 2012-04-17 MED ORDER — CRANBERRY 425 MG PO CAPS: 1.0000 | ORAL_CAPSULE | Freq: Two times a day (BID) | ORAL | Status: DC

## 2012-04-17 MED ORDER — CITALOPRAM HYDROBROMIDE 20 MG PO TABS
20.0000 mg | ORAL_TABLET | Freq: Two times a day (BID) | ORAL | Status: DC
  Administered 2012-04-17 – 2012-04-18 (×2): 20 mg via ORAL
  Filled 2012-04-17 (×3): qty 1

## 2012-04-17 MED ORDER — CALCIUM CARBONATE ANTACID 500 MG PO CHEW
1.0000 | CHEWABLE_TABLET | Freq: Every day | ORAL | Status: DC
  Administered 2012-04-17 – 2012-04-18 (×2): 200 mg via ORAL
  Filled 2012-04-17 (×2): qty 1

## 2012-04-17 NOTE — Progress Notes (Signed)
PHARMACIST - PHYSICIAN ORDER COMMUNICATION  CONCERNING: P&T Medication Policy on Herbal Medications  DESCRIPTION:  This patient's order for:  Cranberry caps  has been noted.  This product(s) is classified as an "herbal" or natural product. Due to a lack of definitive safety studies or FDA approval, nonstandard manufacturing practices, plus the potential risk of unknown drug-drug interactions while on inpatient medications, the Pharmacy and Therapeutics Committee does not permit the use of "herbal" or natural products of this type within [place].   ACTION TAKEN: The pharmacy department is unable to verify this order at this time and your patient has been informed of this safety policy. Please reevaluate patient's clinical condition at discharge and address if the herbal or natural product(s) should be resumed at that time.   

## 2012-04-17 NOTE — Progress Notes (Signed)
Notified by RN that suprapubic catheter was leaking at insertion site, foley not draining well. Discussed with Dr. Annabell Howells urology. I removed the catheter and replaced with a new 20 French catheter and inflated the balloon with 30cc of sterile water, foley draining. [place] ordered to assess bladder and kidneys.

## 2012-04-17 NOTE — Progress Notes (Signed)
Patient ID: [name], male   DOB: 21-Jan-1951, 61 y.o.   MRN: [id number] [facility] PRO[facility]ESS NOTE  [name] [id number] DOB: 04-25-1951 DOA: 04/14/2012 PCP: No primary provider on file.   HPI/Subjective: "Feels better". Nursing reports that Suprapubic catheter has been leaking. No pain around site or purulence. No fevers, chill or NVD.  Objective: Filed Vitals:   04/16/12 0600 04/16/12 1438 04/16/12 2303 04/17/12 0546  BP: 140/81 153/84 131/85 144/89  Pulse: 84 97 84 83  Temp: 97.6 F (36.4 C) 97.6 F (36.4 C) 97.6 F (36.4 C) 97.7 F (36.5 C)  TempSrc: Oral  Oral Oral  Resp: 20 20 20 20   Height:      Weight:      SpO2: 99% 97% 99% 99%    Intake/Output Summary (Last 24 hours) at 04/17/12 1209 Last data filed at 04/17/12 0900  Gross per 24 hour  Intake    260 ml  Output   1300 ml  Net  -1040 ml    Exam:   General:  AOx3, pleasant cooperative  Cardiovascular: RRR, no mrg  Respiratory: scattered upper airway rhonchi, cleared with cough  Abdomen: soft NT, suprapubic cath site looks normal, no purulence or drainage  Data Reviewed: Basic Metabolic Panel:  Lab 04/16/12 9562 04/15/12 0844 04/14/12 2059 04/14/12 2046  NA 138 -- 141 141  K 4.4 -- 4.5 4.4  CL 108 -- 109 106  CO2 20 -- -- 23  GLUCOSE 79 -- 113* 111*  BUN 12 -- 14 13  CREATININE 0.81 0.84 1.10 0.97  CALCIUM 8.7 -- -- 9.6  MG -- -- -- --  PHOS -- -- -- --   Liver Function Tests:  Lab 04/16/12 [facility] 04/14/12 2046  AST 46* 82*  ALT 60* 71*  ALKPHOS 86 107  BILITOT 0.2* 0.3  PROT 7.0 8.0  ALBUMIN 3.0* 3.5   No results found for this basename: LIPASE:5,AMYLASE:5 in the last 168 hours No results found for this basename: AMMONIA:5 in the last 168 hours CBC:  Lab 04/16/12 0622 04/15/12 0844 04/14/12 2059 04/14/12 2046  WBC 6.6 6.0 -- 6.7  NEUTROABS 3.8 -- -- 5.5  HGB 14.2 15.0 16.7 16.2  HCT 42.6 43.8 49.0 48.1  MCV 86.8 86.4 -- 87.1  PLT 146* 134* -- 131*   Cardiac  Enzymes:  Lab 04/14/12 2046  CKTOTAL 200  CKMB 1.9  CKMBINDEX --  TROPONINI <0.30    Lab 04/14/12 2032  GLUCAP 110*    Recent Results (from the past 240 hour(s))  GRAM STAIN     Status: Normal   Collection Time   04/15/12 12:33 AM      Component Value Range Status Comment   Specimen Description URINE, CATHETERIZED   Final    Special Requests NONE   Final    Gram Stain     Final    Value: CYTOSPIN SLIDE     WBC PRESENT,BOTH PMN AND MONONUCLEAR     GRAM POSITIVE COCCI IN PAIRS IN CLUSTERS   Report Status 04/15/2012 FINAL   Final   URINE CULTURE     Status: Normal (Preliminary result)   Collection Time   04/15/12 12:33 AM      Component Value Range Status Comment   Specimen Description URINE, CATHETERIZED   Final    Special Requests NONE   Final    Culture  Setup Time 04/15/2012 01:14   Final    Colony Count PENDING   Incomplete    Culture  Culture reincubated for better growth   Final    Report Status PENDING   Incomplete   MRSA PCR SCREENING     Status: Normal   Collection Time   04/15/12  6:08 AM      Component Value Range Status Comment   MRSA by PCR NEGATIVE  NEGATIVE Final      Studies: No results found.  Scheduled Meds:    . citalopram  20 mg Oral Daily  . enoxaparin  40 mg Subcutaneous Daily  . feeding supplement  30 mL Oral Daily  . imipenem-cilastatin  250 mg Intravenous Q6H  . metoprolol tartrate  12.5 mg Oral BID  . multivitamin with minerals  1 tablet Oral Daily  . sodium chloride  3 mL Intravenous Q12H   Continuous Infusions:    . sodium chloride 1,000 mL (04/17/12 0456)     Assessment/Plan: 1. AMS/Delirium: secondary to UTI, now AMS resolved, back to his baseline- increasing concern about a stone obstruction.  Discussed with Dr. Annabell Howells urology over the phone  Will replace Suprapubic catheter  Get Kidney, ureter, bladder ultrasound to assess for stones  2. UTI: Has indwelling Suprapubic cath, colonization likely but given symptoms and rapid  improvement with treatment had active infection-may have also bee retention. On primaxin, previously sensitive urine/enterococcus and psuedomonas. Awaiting urine cultures, will continue for now, once cultures return will consider oral regimens for antibiotics- spoke with lab-re-cultured for better growth. If SPC draining well should have been low risk for infection, will assess UOP closely. 3. HTN: well controlled, continue current medications 4. MS: stable, moderate to severe debility, total assist with ADLs. Resume medication for contractures/Flexeril.  Code Status: Full Code Family Communication: Discussed with patient only, spouse number [phone] Disposition Plan: Return to [facility] healthcare once culture results obtained.  Anderson Malta, MD  [facility] Pager [phone]  If 7PM-7AM, please contact night-coverage [email or web address] Password [email or web address] 04/17/2012, 12:09 PM   LOS: 3 days

## 2012-04-17 NOTE — Progress Notes (Signed)
[name] had urine leak from the suprapubic catheter site and a tendered abdomen, informed the doctor, and she came and changed the catheter. Did an ultrasound of bladder (-ve). Now he has a urinary output of 500 ml after putting the new catheter and abd is a little softer than what it was. Pt states that his abd normally stays hard.   [name] 04/17/12

## 2012-04-18 NOTE — Progress Notes (Signed)
ANTIBIOTIC CONSULT NOTE - INITIAL  Pharmacy Consult for Primaxin Indication: UTI (h/o recurrent UTIs)  No Known Allergies  Patient Measurements: Height: 5\' 11"  (180.3 cm) Weight: 148 lb 5.9 oz (67.3 kg) IBW/kg (Calculated) : 75.3   Vital Signs: Temp: 98.8 F (37.1 C) (07/05 0529) Temp src: Oral (07/05 0529) BP: 149/92 mmHg (07/05 0529) Pulse Rate: 89  (07/05 0529) Intake/Output from previous day: 07/04 0701 - 07/05 0700 In: 745 [P.O.:542; I.V.:3; IV Piggyback:200] Out: 3600 [Urine:3600] Intake/Output from this shift: Total I/O In: 240 [P.O.:240] Out: -   Labs:  [facility] 04/16/12 0622  WBC 6.6  HGB 14.2  PLT 146*  LABCREA --  CREATININE 0.81   Estimated Creatinine Clearance: 93.5 ml/min (by C-G formula based on Cr of 0.81). No results found for this basename: VANCOTROUGH:2,VANCOPEAK:2,VANCORANDOM:2,GENTTROUGH:2,GENTPEAK:2,GENTRANDOM:2,TOBRATROUGH:2,TOBRAPEAK:2,TOBRARND:2,AMIKACINPEAK:2,AMIKACINTROU:2,AMIKACIN:2, in the last 72 hours   Microbiology: Recent Results (from the past 720 hour(s))  GRAM STAIN     Status: Normal   Collection Time   Timothy/2/13 12:33 AM      Component Value Range Status Comment   Specimen Description URINE, CATHETERIZED   Final    Special Requests NONE   Final    Gram Stain     Final    Value: CYTOSPIN SLIDE     WBC PRESENT,BOTH PMN AND MONONUCLEAR     GRAM POSITIVE COCCI IN PAIRS IN CLUSTERS   Report Status 04/15/2012 FINAL   Final   URINE CULTURE     Status: Normal   Collection Time   Timothy/2/13 12:33 AM      Component Value Range Status Comment   Specimen Description URINE, CATHETERIZED   Final    Special Requests NONE   Final    Culture  Setup Time 04/15/2012 01:14   Final    Colony Count >=100,000 COLONIES/ML   Final    Culture     Final    Value: Multiple bacterial morphotypes present, none predominant. Suggest appropriate recollection if clinically indicated.   Report Status 04/17/2012 FINAL   Final   MRSA PCR SCREENING     Status:  Normal   Collection Time   Timothy/2/13  6:08 AM      Component Value Range Status Comment   MRSA by PCR NEGATIVE  NEGATIVE Final     Medical History: Past Medical History  Diagnosis Date  . MS (multiple sclerosis)     bedridden since 1986  . Neurogenic bladder     with suprapubic catheter  . Dysphagia   . Personal history of PE (pulmonary embolism)     on coumadin  . Hypertension     Medications:  Prescriptions prior to admission  Medication Sig Dispense Refill  . calcium carbonate (TUMS - DOSED IN MG ELEMENTAL CALCIUM) 500 MG chewable tablet Chew 1 tablet by mouth daily.      . citalopram (CELEXA) 20 MG tablet Take 20 mg by mouth at bedtime.      . Cranberry 425 MG CAPS Take 1 capsule by mouth 2 (two) times daily.      . cyclobenzaprine (FLEXERIL) 10 MG tablet Take 10 mg by mouth 3 (three) times daily as needed. For muscle spasms      . metoprolol tartrate (LOPRESSOR) 25 MG tablet Take 12.5 mg by mouth 2 (two) times daily.       . Multiple Vitamin (MULITIVITAMIN WITH MINERALS) TABS Take 1 tablet by mouth daily.      Marland Kitchen sulfamethoxazole-trimethoprim (BACTRIM DS) 800-160 MG per tablet Take 1 tablet by mouth  2 (two) times daily.       Assessment: 61 y.o. [name] presents with AMS - U/A compatible with UTI. To begin primaxin. Urine cx multiple species.  Goal of Therapy:  Eradication of infection  Plan:  1. Primaxin 250mg  IV q6h.  2. Will f/u microbiological data and renal function  Talbert Cage, PharmD Clinical pharmacist, pager [phone] Timothy/02/2012,10:32 AM

## 2012-04-18 NOTE — Evaluation (Signed)
Physical Therapy Evaluation Patient Details Name: [name] MRN: [id number] DOB: 1951-03-25 Today's Date: 04/18/2012 Time: 1000-1025 PT Time Calculation (min): 25 min  PT Assessment / Plan / Recommendation Clinical Impression  Pt is 61 y/o male with hx of MS resulting in contractures in UE and LE body.  Pt admitted for UTI due to neurogenic bladder.  PT order due to increase contractures since hospital admission.  However per pt contractures have return to baseline.  No further PT needed and recommend RN staff to use lift to get OOB.  PT will sign off.    PT Assessment  Patent does not need any further PT services    Follow Up Recommendations  Skilled nursing facility (return to SNF)    Barriers to Discharge        Equipment Recommendations       Recommendations for Other Services     Frequency      Precautions / Restrictions     Pertinent Vitals/Pain No c/o pain      Mobility  Bed Mobility Bed Mobility: Rolling Right;Right Sidelying to Sit;Sit to Supine Rolling Right: 1: +2 Total assist Rolling Right: Patient Percentage: 0% Right Sidelying to Sit: 1: +2 Total assist Right Sidelying to Sit: Patient Percentage: 0% Sit to Supine: 1: +2 Total assist Sit to Supine: Patient Percentage: 0% Details for Bed Mobility Assistance: +2 (A) with all mobility.  Pt needed extra time to prevent extensor tone by flexing knee and hips prior to rolling and any bed mobility.    Exercises     PT Diagnosis:    PT Problem List:   PT Treatment Interventions:     PT Goals    Visit Information  Last PT Received On: 04/18/12 Assistance Needed: +1    Subjective Data  Subjective: "My contractures got worse but they are better now.  I was this contracted before coming into the hospital."   Prior Functioning  Home Living Additional Comments: Long term resident at [facility] Prior Function Level of Independence: Needs assistance Needs Assistance:  Bathing;Dressing;Feeding;Grooming;Toileting Bath: Total Dressing: Total Feeding: Total Grooming: Total Toileting: Total Comments: Pt bedridden since 1986.  Order for contractures. Communication Communication: Expressive difficulties    Cognition  Overall Cognitive Status: Appears within functional limits for tasks assessed/performed Arousal/Alertness: Awake/alert Orientation Level: Appears intact for tasks assessed Behavior During Session: [facility] for tasks performed    Extremity/Trunk Assessment Right Upper Extremity Assessment RUE ROM/Strength/Tone: Deficits RUE ROM/Strength/Tone Deficits: Unable to assess strength due to severe tone including:  shoulder retracters, elbow flexion, and mild tone in fingers and wrist.  Pt able to move wrist and fingers againt gravity when isolated from elbow.  Right UE > Left UE in tone. Left Upper Extremity Assessment LUE ROM/Strength/Tone: Deficits LUE ROM/Strength/Tone Deficits: Unable to assess strength due to severe tone including:  shoulder retracters, elbow flexion, and mild tone in fingers and wrist.  Pt able to move wrist and fingers againt gravity when isolated from elbow.  Right UE > Left UE in tone. Right Lower Extremity Assessment RLE ROM/Strength/Tone: Deficits RLE ROM/Strength/Tone Deficits: Pt able to activated quad and hamstrings but difficult to assess secondary to tone throught range in knee flexion, knee extension and severe tone in bilateral DF.  Able to break tone and keep LE into flexion. Left Lower Extremity Assessment LLE ROM/Strength/Tone: Deficits LLE ROM/Strength/Tone Deficits: Pt able to activated quad and hamstrings but difficult to assess secondary to tone throught range in knee flexion, knee extension and severe tone  in bilateral DF.  Able to break tone and keep LE into flexio Trunk Assessment Trunk Assessment: Other exceptions (excessive tone in shoulders causing changes in overall trunk)   Balance Balance Balance Assessed:  Yes Static Sitting Balance Static Sitting - Balance Support: No upper extremity supported Static Sitting - Level of Assistance: 2: Max assist Static Sitting - Comment/# of Minutes: (A) to prevent posterior lean and returning to supine.    End of Session PT - End of Session Activity Tolerance: Patient tolerated treatment well Patient left: in bed;with call bell/phone within reach Nurse Communication: Mobility status  GP     Alyssha Housh 04/18/2012, 1:06 PM Jake Shark, PT DPT [phone]

## 2012-04-18 NOTE — Clinical Social Work Note (Signed)
Pt is ready to discharge and return to [facility]. Facility has received discharge summary and is ready to accept pt. Pt and family are agreeable to discharge plan. [facility] will provide transportation. CSW is signing off as no further needs identified.   Dede Query, MSW, Theresia Majors [phone]

## 2012-04-18 NOTE — Discharge Summary (Signed)
Physician Discharge Summary  [name] RUE:[id number] DOB: Apr 19, 1951 DOA: 04/14/2012  PCP: No primary provider on file.  Admit date: 04/14/2012 Discharge date: 04/18/2012  Recommendations for Outpatient Follow-up:  1. Need urology evaluation in 3-4 weeks. 2. Follow-up urine culture results from hospitalization, multiple bacterial subtypes. 3. Suprapubic catheter care recommendations for Facility are listed below. 4. Also needs Podiatry Referral at Facility  Discharge Diagnoses:   1. Altered Mental Status, in setting of suprapubic catheter obstruction and UTI 2. Multiple Sclerosis, significant debility 3. Hypertension 4. Decubitus ulcer 5. Extremity Contractures  Discharge Condition: Stable  Diet recommendation: Prior Diet/Regular  History of present illness: 61 year old male with history of multiple sclerosis and suprapubic catheter has had recurrent urine tract infections was brought from the nursing home because of increasing confusion fever. In the ER he was found to have a urinalysis compatible with UTI which most likely is the cause of his symptoms.   Hospital Course:  1. AMS, setting of UTI Patient had symptoms and UA suggestive of UTI, has had recurrent problems with this. I discussed case with urology in detail and this is probably related to catheter obstruction in setting of bacterial colonization. Ultrasound did not show obstruction or hydronephrosis, but imaging was obtained after I flushed and replaced catheter. * Will stop empirical antibiotic (give primaxin IV in hospital)- he got 4 days of treatment * He has not had any fever and has had no WBC increase while in hospital to suggest severe infection, rapid recovery of his mental status * Needs outpatient urology evaluation * Recommend replacing catheter frequently, at least every month, irrigate/flush foley q weekly, maintain adequate UOP  Procedures:  Replaced Suprapubic catheter/inflated baloon with 30cc SW,  irrigated catheter  Consultations:  none  Discharge Exam: Filed Vitals:   04/18/12 0529  BP: 149/92  Pulse: 89  Temp: 98.8 F (37.1 C)  Resp: 18   Filed Vitals:   04/17/12 0546 04/17/12 1447 04/17/12 2100 04/18/12 0529  BP: 144/89 149/91 153/94 149/92  Pulse: 83 80 85 89  Temp: 97.7 F (36.5 C) 97.4 F (36.3 C) 98.3 F (36.8 C) 98.8 F (37.1 C)  TempSrc: Oral Oral Oral Oral  Resp: 20 20 20 18   Height:      Weight:      SpO2: 99% 100% 100% 97%   General appearance: NAD, conversant  Eyes: anicteric sclerae, moist conjunctivae; no lid-lag; PERRLA HENT: Atraumatic; oropharynx clear with moist mucous membranes and no mucosal ulcerations; normal hard and soft palate Neck: Trachea midline; FROM, supple, no thyromegaly or lymphadenopathy Lungs: CTA, with normal respiratory effort and no intercostal retractions CV: RRR, no MRGs  Abdomen: Very tense at baseline, non-tender; no masses or HSM/ PUBIC CATH SITE LOOKS NORMAL, red granulation tissue normal Extremities: UE contractures, No peripheral edema or extremity lymphadenopathy Skin: Normal temperature, turgor and texture; no rash, sacral decubitus  Psych: Appropriate affect, alert and oriented to person, place and time   Discharge Instructions  Discharge Orders    Future Orders Please Complete By Expires   Diet - low sodium heart healthy      Increase activity slowly      Discharge instructions      Comments:   See Discharge Instructions for Catheter Recs     Medication List  As of 04/18/2012 12:06 PM   STOP taking these medications         sulfamethoxazole-trimethoprim 800-160 MG per tablet         TAKE these medications  calcium carbonate 500 MG chewable tablet   Commonly known as: TUMS - dosed in mg elemental calcium   Chew 1 tablet by mouth daily.      citalopram 20 MG tablet   Commonly known as: CELEXA   Take 20 mg by mouth at bedtime.      Cranberry 425 MG Caps   Take 1 capsule by mouth 2 (two)  times daily.      cyclobenzaprine 10 MG tablet   Commonly known as: FLEXERIL   Take 10 mg by mouth 3 (three) times daily as needed. For muscle spasms      metoprolol tartrate 25 MG tablet   Commonly known as: LOPRESSOR   Take 12.5 mg by mouth 2 (two) times daily.      multivitamin with minerals Tabs   Take 1 tablet by mouth daily.              The results of significant diagnostics from this hospitalization (including imaging, microbiology, ancillary and laboratory) are listed below for reference.    Significant Diagnostic Studies: Ct Head Wo Contrast  04/14/2012  *RADIOLOGY REPORT*  Clinical Data: Altered mental status.  CT HEAD WITHOUT CONTRAST  Technique:  Contiguous axial images were obtained from the base of the skull through the vertex without contrast.  Comparison: 10/26/2005.  Findings: Stable mild age advanced atrophy and patchy periventricular white matter disease.  No extra-axial fluid collections.  No CT findings for acute hemispheric infarction and/or intracranial hemorrhage.  The brainstem and cerebellum appear normal and stable.  The bony structures are intact.  The paranasal sinuses and mastoid air cells are clear.  IMPRESSION: Stable age advanced cerebral atrophy. No acute intracranial findings.  Original Report Authenticated By: P. Loralie Champagne, M.D.   US Renal  04/17/2012  *RADIOLOGY REPORT*  Clinical Data: Urinary retention.  Suprapubic catheter.  RENAL/URINARY TRACT ULTRASOUND COMPLETE  Comparison:  Renal ultrasound 02/12/2011.  Findings:  Right Kidney:  Measures 9.3 cm and appears normal without stone, mass or hydronephrosis.  Left Kidney:  Measures 8.6 cm and appears normal.  No stone, mass or hydronephrosis.  Bladder:  Decompressed with a catheter in place.  IMPRESSION: No acute finding.  Original Report Authenticated By: Bernadene Bell. Maricela Curet, M.D.   Dg Chest Port 1 View  04/14/2012  *RADIOLOGY REPORT*  Clinical Data: Fever, altered mental status.  PORTABLE CHEST -  1 VIEW  Comparison: 07/16/2011  Findings: Left base atelectasis.  Right lung is clear.  Heart is upper limits normal in size.  No effusions.  No acute bony abnormality.  IMPRESSION: Left base atelectasis.  Original Report Authenticated By: Cyndie Chime, M.D.    Microbiology: Recent Results (from the past 240 hour(s))  GRAM STAIN     Status: Normal   Collection Time   04/15/12 12:33 AM      Component Value Range Status Comment   Specimen Description URINE, CATHETERIZED   Final    Special Requests NONE   Final    Gram Stain     Final    Value: CYTOSPIN SLIDE     WBC PRESENT,BOTH PMN AND MONONUCLEAR     GRAM POSITIVE COCCI IN PAIRS IN CLUSTERS   Report Status 04/15/2012 FINAL   Final   URINE CULTURE     Status: Normal   Collection Time   04/15/12 12:33 AM      Component Value Range Status Comment   Specimen Description URINE, CATHETERIZED   Final    Special Requests NONE  Final    Culture  Setup Time 04/15/2012 01:14   Final    Colony Count >=100,000 COLONIES/ML   Final    Culture     Final    Value: Multiple bacterial morphotypes present, none predominant. Suggest appropriate recollection if clinically indicated.   Report Status 04/17/2012 FINAL   Final   MRSA PCR SCREENING     Status: Normal   Collection Time   04/15/12  6:08 AM      Component Value Range Status Comment   MRSA by PCR NEGATIVE  NEGATIVE Final      Labs: Basic Metabolic Panel:  Lab 04/16/12 9811 04/15/12 0844 04/14/12 2059 04/14/12 2046  NA 138 -- 141 141  K 4.4 -- 4.5 4.4  CL 108 -- 109 106  CO2 20 -- -- 23  GLUCOSE 79 -- 113* 111*  BUN 12 -- 14 13  CREATININE 0.81 0.84 1.10 0.97  CALCIUM 8.7 -- -- 9.6  MG -- -- -- --  PHOS -- -- -- --   Liver Function Tests:  Lab 04/16/12 0622 04/14/12 2046  AST 46* 82*  ALT 60* 71*  ALKPHOS 86 107  BILITOT 0.2* 0.3  PROT 7.0 8.0  ALBUMIN 3.0* 3.5   No results found for this basename: LIPASE:5,AMYLASE:5 in the last 168 hours No results found for this basename:  AMMONIA:5 in the last 168 hours CBC:  Lab 04/16/12 0622 04/15/12 0844 04/14/12 2059 04/14/12 2046  WBC 6.6 6.0 -- 6.7  NEUTROABS 3.8 -- -- 5.5  HGB 14.2 15.0 16.7 16.2  HCT 42.6 43.8 49.0 48.1  MCV 86.8 86.4 -- 87.1  PLT 146* 134* -- 131*   Cardiac Enzymes:  Lab 04/14/12 2046  CKTOTAL 200  CKMB 1.9  CKMBINDEX --  TROPONINI <0.30   CBG:  Lab 04/14/12 2032  GLUCAP 110*    Time coordinating discharge: 45 minutes  Signed:  Anderson Malta, MD  [facility] 04/18/2012, 12:06 PM

## 2012-04-18 NOTE — ED Provider Notes (Signed)
  I performed a history and physical examination of [name] and discussed his management with Dr. Leary Roca.  I agree with the history, physical, assessment, and plan of care, with the following exceptions: None  I was present for the following procedures: None Time Spent in Critical Care of the patient: None Time spent in discussions with the patient and family: 10  Mariella Blackwelder Corlis Leak, MD 04/18/12 0330

## 2012-04-18 NOTE — Progress Notes (Signed)
Gave a good bath, and changed him to a clean gown. Had a BM. Gave all health edu, and called the facility for report.

## 2012-07-19 ENCOUNTER — Inpatient Hospital Stay (HOSPITAL_COMMUNITY)
Admission: EM | Admit: 2012-07-19 | Discharge: 2012-07-23 | DRG: 698 | Disposition: A | Discharge status: Skilled Nursing Facility | Payer: Medicare Other | Attending: Internal Medicine | Admitting: Internal Medicine

## 2012-07-19 DIAGNOSIS — L89109 Pressure ulcer of unspecified part of back, unspecified stage: Secondary | ICD-10-CM

## 2012-07-19 DIAGNOSIS — Y9210 Unspecified residential institution as the place of occurrence of the external cause: Secondary | ICD-10-CM | POA: Diagnosis present

## 2012-07-19 DIAGNOSIS — Z87440 Personal history of urinary (tract) infections: Secondary | ICD-10-CM

## 2012-07-19 DIAGNOSIS — R1319 Other dysphagia: Secondary | ICD-10-CM | POA: Diagnosis present

## 2012-07-19 DIAGNOSIS — I10 Essential (primary) hypertension: Secondary | ICD-10-CM | POA: Diagnosis present

## 2012-07-19 DIAGNOSIS — N390 Urinary tract infection, site not specified: Secondary | ICD-10-CM | POA: Diagnosis present

## 2012-07-19 DIAGNOSIS — Z8614 Personal history of Methicillin resistant Staphylococcus aureus infection: Secondary | ICD-10-CM

## 2012-07-19 DIAGNOSIS — N319 Neuromuscular dysfunction of bladder, unspecified: Secondary | ICD-10-CM | POA: Diagnosis present

## 2012-07-19 DIAGNOSIS — Z7401 Bed confinement status: Secondary | ICD-10-CM

## 2012-07-19 DIAGNOSIS — A419 Sepsis, unspecified organism: Secondary | ICD-10-CM | POA: Diagnosis present

## 2012-07-19 DIAGNOSIS — F3289 Other specified depressive episodes: Secondary | ICD-10-CM | POA: Diagnosis present

## 2012-07-19 DIAGNOSIS — Z86711 Personal history of pulmonary embolism: Secondary | ICD-10-CM

## 2012-07-19 DIAGNOSIS — F329 Major depressive disorder, single episode, unspecified: Secondary | ICD-10-CM | POA: Diagnosis present

## 2012-07-19 DIAGNOSIS — T83511A Infection and inflammatory reaction due to indwelling urethral catheter, initial encounter: Principal | ICD-10-CM | POA: Diagnosis present

## 2012-07-19 DIAGNOSIS — G9340 Encephalopathy, unspecified: Secondary | ICD-10-CM

## 2012-07-19 DIAGNOSIS — Y846 Urinary catheterization as the cause of abnormal reaction of the patient, or of later complication, without mention of misadventure at the time of the procedure: Secondary | ICD-10-CM | POA: Diagnosis present

## 2012-07-19 DIAGNOSIS — Z8701 Personal history of pneumonia (recurrent): Secondary | ICD-10-CM

## 2012-07-19 DIAGNOSIS — Z9089 Acquired absence of other organs: Secondary | ICD-10-CM

## 2012-07-19 DIAGNOSIS — Z66 Do not resuscitate: Secondary | ICD-10-CM | POA: Diagnosis present

## 2012-07-19 DIAGNOSIS — G35 Multiple sclerosis: Secondary | ICD-10-CM | POA: Diagnosis present

## 2012-07-19 LAB — OCCULT BLOOD, POC DEVICE: Fecal Occult Bld: NEGATIVE

## 2012-07-19 MED ORDER — SODIUM CHLORIDE 0.9 % IV SOLN
1000.0000 mL | Freq: Once | INTRAVENOUS | Status: AC
  Administered 2012-07-20: 1000 mL via INTRAVENOUS

## 2012-07-19 MED ORDER — SODIUM CHLORIDE 0.9 % IV SOLN
1000.0000 mL | INTRAVENOUS | Status: DC
  Administered 2012-07-20: 1000 mL via INTRAVENOUS

## 2012-07-19 MED ORDER — CEFEPIME HCL 2 G IJ SOLR
2.0000 g | INTRAMUSCULAR | Status: AC
  Administered 2012-07-20: 2 g via INTRAVENOUS
  Filled 2012-07-19: qty 2

## 2012-07-19 MED ORDER — SODIUM CHLORIDE 0.9 % IV SOLN
1000.0000 mL | Freq: Once | INTRAVENOUS | Status: AC
  Administered 2012-07-19: 1000 mL via INTRAVENOUS

## 2012-07-19 NOTE — ED Notes (Signed)
Per EMS: pt coming from [facility] with c/o elevated HR of 124, and possible UTI. Pt has a foley in place. Pt has hx of UTI's.  Pt is a DNR. Pt is A&Ox4. Pt is warm and diaphoretic.

## 2012-07-19 NOTE — ED Notes (Signed)
EKG printed and given to EDP Kohut for review. Last confirmed EKG printed for comparison. 

## 2012-07-19 NOTE — ED Notes (Signed)
[id number]> Expected date:<BR> Expected time:<BR> Means of arrival:<BR> Comments:<BR> [place], M, UTI

## 2012-07-19 NOTE — ED Notes (Signed)
Pt alert, arrives via EMS from [facility], per EMS, called for ? UTI, staff at facility had sent urine sample no results, pt denies pain, Foley catheter intact, resp even unlabored

## 2012-07-20 ENCOUNTER — Emergency Department (HOSPITAL_COMMUNITY): Payer: Medicare Other

## 2012-07-20 ENCOUNTER — Encounter (HOSPITAL_COMMUNITY): Payer: Self-pay | Admitting: Internal Medicine

## 2012-07-20 DIAGNOSIS — R6889 Other general symptoms and signs: Secondary | ICD-10-CM

## 2012-07-20 DIAGNOSIS — L89109 Pressure ulcer of unspecified part of back, unspecified stage: Secondary | ICD-10-CM

## 2012-07-20 LAB — URINE MICROSCOPIC-ADD ON

## 2012-07-20 LAB — URINALYSIS, ROUTINE W REFLEX MICROSCOPIC
Glucose, UA: NEGATIVE mg/dL
Protein, ur: 100 mg/dL — AB
Specific Gravity, Urine: 1.015 (ref 1.005–1.030)
pH: 6 (ref 5.0–8.0)

## 2012-07-20 LAB — COMPREHENSIVE METABOLIC PANEL
ALT: 22 U/L (ref 0–53)
ALT: 17 U/L (ref 0–53)
AST: 22 U/L (ref 0–37)
AST: 21 U/L (ref 0–37)
Albumin: 3.9 g/dL (ref 3.5–5.2)
Albumin: 3 g/dL — ABNORMAL LOW (ref 3.5–5.2)
Alkaline Phosphatase: 91 U/L (ref 39–117)
Calcium: 9.8 mg/dL (ref 8.4–10.5)
Calcium: 8.7 mg/dL (ref 8.4–10.5)
Creatinine, Ser: 0.86 mg/dL (ref 0.50–1.35)
GFR calc non Af Amer: >90 mL/min (ref >90)
Glucose, Bld: 97 mg/dL (ref 70–99)
Potassium: 4 mEq/L (ref 3.5–5.1)
Sodium: 142 mEq/L (ref 135–145)
Sodium: 145 mEq/L (ref 135–145)
Total Protein: 8.3 g/dL (ref 6.0–8.3)
Total Protein: 6.7 g/dL (ref 6.0–8.3)

## 2012-07-20 LAB — CBC WITH DIFFERENTIAL/PLATELET
Basophils Absolute: 0 10*3/uL (ref 0.0–0.1)
Basophils Relative: 0 % (ref 0–1)
Eosinophils Absolute: 0 10*3/uL (ref 0.0–0.7)
Eosinophils Relative: 0 % (ref 0–5)
Lymphs Abs: 1.9 10*3/uL (ref 0.7–4.0)
MCH: 29.5 pg (ref 26.0–34.0)
Neutrophils Relative %: 78 % — ABNORMAL HIGH (ref 43–77)
Platelets: 197 10*3/uL (ref 150–400)
RBC: 5.52 MIL/uL (ref 4.22–5.81)
RDW: 15.3 % (ref 11.5–15.5)

## 2012-07-20 LAB — CBC
HCT: 41.9 % (ref 39.0–52.0)
Hemoglobin: 13.9 g/dL (ref 13.0–17.0)
MCH: 29.3 pg (ref 26.0–34.0)
MCHC: 33.2 g/dL (ref 30.0–36.0)
MCV: 88.4 fL (ref 78.0–100.0)
RBC: 4.74 MIL/uL (ref 4.22–5.81)

## 2012-07-20 LAB — MRSA PCR SCREENING: MRSA by PCR: NEGATIVE

## 2012-07-20 LAB — PHOSPHORUS: Phosphorus: 2.4 mg/dL (ref 2.3–4.6)

## 2012-07-20 LAB — MAGNESIUM: Magnesium: 1.9 mg/dL (ref 1.5–2.5)

## 2012-07-20 MED ORDER — BIOTENE DRY MOUTH MT LIQD
15.0000 mL | Freq: Two times a day (BID) | OROMUCOSAL | Status: DC
  Administered 2012-07-20 – 2012-07-23 (×5): 15 mL via OROMUCOSAL

## 2012-07-20 MED ORDER — CYCLOBENZAPRINE HCL 10 MG PO TABS
10.0000 mg | ORAL_TABLET | Freq: Three times a day (TID) | ORAL | Status: DC | PRN
  Filled 2012-07-20: qty 1

## 2012-07-20 MED ORDER — SODIUM CHLORIDE 0.9 % IJ SOLN
3.0000 mL | Freq: Two times a day (BID) | INTRAMUSCULAR | Status: DC
  Administered 2012-07-20 – 2012-07-21 (×2): 3 mL via INTRAVENOUS

## 2012-07-20 MED ORDER — GUAIFENESIN-DM 100-10 MG/5ML PO SYRP: 5.0000 mL | ORAL_SOLUTION | ORAL | Status: DC | PRN

## 2012-07-20 MED ORDER — ACETAMINOPHEN 650 MG RE SUPP: 650.0000 mg | Freq: Four times a day (QID) | RECTAL | Status: DC | PRN

## 2012-07-20 MED ORDER — ENOXAPARIN SODIUM 40 MG/0.4ML ~~LOC~~ SOLN
40.0000 mg | SUBCUTANEOUS | Status: DC
  Administered 2012-07-20 – 2012-07-21 (×2): 40 mg via SUBCUTANEOUS
  Filled 2012-07-20 (×2): qty 0.4

## 2012-07-20 MED ORDER — METOPROLOL TARTRATE 12.5 MG HALF TABLET
12.5000 mg | ORAL_TABLET | Freq: Two times a day (BID) | ORAL | Status: DC
  Administered 2012-07-20 – 2012-07-23 (×7): 12.5 mg via ORAL
  Filled 2012-07-20 (×8): qty 1

## 2012-07-20 MED ORDER — DOCUSATE SODIUM 100 MG PO CAPS
100.0000 mg | ORAL_CAPSULE | Freq: Two times a day (BID) | ORAL | Status: DC
  Administered 2012-07-20 – 2012-07-23 (×7): 100 mg via ORAL
  Filled 2012-07-20 (×8): qty 1

## 2012-07-20 MED ORDER — ACETAMINOPHEN 325 MG PO TABS: 650.0000 mg | ORAL_TABLET | Freq: Four times a day (QID) | ORAL | Status: DC | PRN

## 2012-07-20 MED ORDER — CITALOPRAM HYDROBROMIDE 20 MG PO TABS
30.0000 mg | ORAL_TABLET | Freq: Every day | ORAL | Status: DC
  Administered 2012-07-20 – 2012-07-22 (×3): 30 mg via ORAL
  Filled 2012-07-20 (×4): qty 1

## 2012-07-20 MED ORDER — ONDANSETRON HCL 4 MG/2ML IJ SOLN: 4.0000 mg | Freq: Four times a day (QID) | INTRAMUSCULAR | Status: DC | PRN

## 2012-07-20 MED ORDER — HYDROCODONE-ACETAMINOPHEN 5-325 MG PO TABS: 1.0000 | ORAL_TABLET | ORAL | Status: DC | PRN

## 2012-07-20 MED ORDER — ONDANSETRON HCL 4 MG PO TABS: 4.0000 mg | ORAL_TABLET | Freq: Four times a day (QID) | ORAL | Status: DC | PRN

## 2012-07-20 MED ORDER — SODIUM CHLORIDE 0.9 % IJ SOLN
10.0000 mL | INTRAMUSCULAR | Status: DC | PRN
  Administered 2012-07-20 – 2012-07-23 (×7): 10 mL

## 2012-07-20 MED ORDER — DEXTROSE 5 % IV SOLN
1.0000 g | Freq: Three times a day (TID) | INTRAVENOUS | Status: DC
  Administered 2012-07-20 – 2012-07-23 (×11): 1 g via INTRAVENOUS
  Filled 2012-07-20 (×12): qty 1

## 2012-07-20 MED ORDER — ALBUTEROL SULFATE (5 MG/ML) 0.5% IN NEBU: 2.5000 mg | INHALATION_SOLUTION | RESPIRATORY_TRACT | Status: DC | PRN

## 2012-07-20 MED ORDER — SODIUM CHLORIDE 0.9 % IV SOLN
1000.0000 mL | INTRAVENOUS | Status: DC
  Administered 2012-07-21: 1000 mL via INTRAVENOUS

## 2012-07-20 NOTE — Progress Notes (Signed)
Spoke with [name] in infection prevention. Pt does not need to be on any infection precautions.

## 2012-07-20 NOTE — H&P (Addendum)
PCP:  [name]   Chief Complaint:   UTI  HPI: [name] is a 61 y.o. male   has a past medical history of MS (multiple sclerosis); Neurogenic bladder; Dysphagia; Personal history of PE (pulmonary embolism); and Hypertension.   Presented with  Patient was brought in from [facility] care center because of increase heart rate, sweating and feeling clammy. He was noted to be febrile eo 100.9. Never hypotensive. Patient has history of recurrent UTI. Due to poor IV access patient received central line in emergency department. Initially he was noted to be very diaphoretic and tachycardic after administration of IV fluids  this has improved. Currently he appears to be comfortable and hypertensive with blood pressures up to 170s.   Review of Systems:    Pertinent positives include: Fevers, chills,  Constitutional:  No weight loss, night sweats,  fatigue, weight loss  HEENT:  No headaches, Difficulty swallowing,Tooth/dental problems,Sore throat,  No sneezing, itching, ear ache, nasal congestion, post nasal drip,  Cardio-vascular:  No chest pain, Orthopnea, PND, anasarca, dizziness, palpitations.no Bilateral lower extremity swelling  GI:  No heartburn, indigestion, abdominal pain, nausea, vomiting, diarrhea, change in bowel habits, loss of appetite, melena, blood in stool, hematemesis Resp:  no shortness of breath at rest. No dyspnea on exertion, No excess mucus, no productive cough, No non-productive cough, No coughing up of blood.No change in color of mucus.No wheezing. Skin:  no rash or lesions. No jaundice GU:  no dysuria, change in color of urine, no urgency or frequency. No straining to urinate.  No flank pain.  Musculoskeletal:  No joint pain or no joint swelling. No decreased range of motion. No back pain.  Psych:  No change in mood or affect. No depression or anxiety. No memory loss.  Neuro: no localizing neurological complaints, no tingling, no weakness, no  double vision, no gait abnormality, no slurred speech, no confusion  Otherwise ROS are negative except for above, 10 systems were reviewed  Past Medical History: Past Medical History  Diagnosis Date  . MS (multiple sclerosis)     bedridden since 1986  . Neurogenic bladder     with suprapubic catheter  . Dysphagia   . Personal history of PE (pulmonary embolism)     on coumadin  . Hypertension    Past Surgical History  Procedure Date  . Tonsillectomy and adenoidectomy   . Suprapubic catheter placement     11/11     Medications: Prior to Admission medications   Medication Sig Start Date End Date Taking? Authorizing Provider  calcium carbonate (TUMS - DOSED IN MG ELEMENTAL CALCIUM) 500 MG chewable tablet Chew 1 tablet by mouth daily.   Yes Historical Provider, MD  citalopram (CELEXA) 20 MG tablet Take 30 mg by mouth at bedtime.    Yes Historical Provider, MD  Cranberry 425 MG CAPS Take 1 capsule by mouth 2 (two) times daily.   Yes Historical Provider, MD  metoprolol tartrate (LOPRESSOR) 25 MG tablet Take 12.5 mg by mouth 2 (two) times daily.    Yes Historical Provider, MD  Multiple Vitamin (MULITIVITAMIN WITH MINERALS) TABS Take 1 tablet by mouth daily.   Yes Historical Provider, MD  cyclobenzaprine (FLEXERIL) 10 MG tablet Take 10 mg by mouth 3 (three) times daily as needed. For muscle spasms    Historical Provider, MD    Allergies:  No Known Allergies  Social History:  Bed bound Lives at [facility] care center   reports that he has never smoked.  He does not have any smokeless tobacco history on file. He reports that he does not drink alcohol or use illicit drugs.   Family History: family history includes Heart disease in his father.    Physical Exam: Patient Vitals for the past 24 hrs:  BP Temp Temp src Pulse Resp SpO2  07/19/12 2238 - 100.9 F (38.3 C) Rectal - - -  07/19/12 2157 168/92 mmHg 98.8 F (37.1 C) Oral 147  18  97 %  07/19/12 2153 - - - - - 97 %      1. General:  in No Acute distress 2. Psychological: Alert and  Oriented 3. Head/ENT:   Dry Mucous Membranes                          Head Non traumatic, neck supple                          Normal   Dentition 4. SKIN:  decreased Skin turgor,  Skin clean Dry and intact no rash 5. Heart: Regular rate and rhythm no Murmur, Rub or gallop 6. Lungs: Clear to auscultation bilaterally, no wheezes or crackles   7. Abdomen: Soft, non-tender, Non distended 8. Lower extremities: no clubbing, cyanosis, or edema 9. Neurologically able to move arms but not legs 10. MSK: diminished range of motion  to ER nursing staff stage I decubitus ulcers noted  body mass index is unknown because there is no height or weight on file.   Labs on Admission:   [facility] 07/20/12 0030  NA 142  K 4.0  CL 107  CO2 22  GLUCOSE 97  BUN 11  CREATININE 1.00  CALCIUM 9.8  MG --  PHOS --    Basename 07/20/12 0030  AST 22  ALT 22  ALKPHOS 91  BILITOT 0.2*  PROT 8.3  ALBUMIN 3.9   No results found for this basename: LIPASE:2,AMYLASE:2 in the last 72 hours  Basename 07/20/12 0030  WBC 16.0*  NEUTROABS 12.5*  HGB 16.3  HCT 48.4  MCV 87.7  PLT 197   No results found for this basename: CKTOTAL:3,CKMB:3,CKMBINDEX:3,TROPONINI:3 in the last 72 hours No results found for this basename: TSH,T4TOTAL,FREET3,T3FREE,THYROIDAB in the last 72 hours No results found for this basename: VITAMINB12:2,FOLATE:2,FERRITIN:2,TIBC:2,IRON:2,RETICCTPCT:2 in the last 72 hours No results found for this basename: HGBA1C    The CrCl is unknown because both a height and weight (above a minimum accepted value) are required for this calculation. ABG    Component Value Date/Time   PHART 7.406 11/29/2009 1810   HCO3 23.3 02/10/2011 2255   TCO2 23 04/14/2012 2059   ACIDBASEDEF 2.1* 02/10/2011 2255   O2SAT 71.3 02/10/2011 2255     Lab Results  Component Value Date   DDIMER  Value: 0.51        AT THE INHOUSE ESTABLISHED CUTOFF  VALUE OF 0.48 ug/mL FEU, THIS ASSAY HAS BEEN DOCUMENTED IN THE LITERATURE TO HAVE A SENSITIVITY AND NEGATIVE PREDICTIVE VALUE OF AT LEAST 98 TO 99%.  THE TEST RESULT SHOULD BE CORRELATED WITH AN ASSESSMENT OF THE CLINICAL PROBABILITY OF DVT / VTE.* 04/24/2010    UA evidence of UTI   Cultures:    Component Value Date/Time   SDES URINE, CATHETERIZED 04/15/2012 0033   SDES URINE, CATHETERIZED 04/15/2012 0033   SPECREQUEST NONE 04/15/2012 0033   SPECREQUEST NONE 04/15/2012 0033   CULT Multiple bacterial morphotypes present, none predominant. Suggest appropriate  recollection if clinically indicated. 04/15/2012 0033   REPTSTATUS 04/15/2012 FINAL 04/15/2012 0033   REPTSTATUS 04/17/2012 FINAL 04/15/2012 0033       Radiological Exams on Admission: Dg Chest Portable 1 View  07/20/2012  *RADIOLOGY REPORT*  Clinical Data: Central line placement.  PORTABLE CHEST - 1 VIEW  Comparison: 04/14/2012.  Findings: Interval right jugular catheter with its tip in the proximal superior vena cava.  No pneumothorax.  Clear lungs. Stable heart size at the upper limit of normal.  Unremarkable bones.  IMPRESSION: Right jugular catheter tip in the proximal superior vena cava without pneumothorax.   Original Report Authenticated By: Darrol Angel, M.D.     Chart has been reviewed  Assessment/Plan  48-year-old male with history of multiple sclerosis and recurrent urinary tract infections due to neurogenic bladder  Present on Admission:  .UTI (lower urinary tract infection) - patient has recurrent UTIs in the past he has had Pseudomonas sensitive to cefepime and enterococcus we'll await results of urine culture but for now will continue already initiated cefepime per pharmacy. Await results of blood cultures patient currently does not appear to be in septic shock appears to be stable will watch in telemetry  .Multiple sclerosis - currently stable patient residing in a nursing facility  .HYPERTENSION, BENIGN ESSENTIAL - restart  metoprolol  decubitus ulcers - will write for wound care consult Prophylaxis:  Lovenox,  CODE STATUS: DO NOT RESUSCITATE don't intubate as per patient's wishes  Other plan as per orders.  I have spent a total of 55 min on this admission  Stephenia Vogan 07/20/2012, 2:10 AM

## 2012-07-20 NOTE — Progress Notes (Signed)
ANTIBIOTIC CONSULT NOTE - INITIAL  Pharmacy Consult for cefepime Indication: rule out sepsis--UTI  No Known Allergies  Patient Measurements: Height: 5\' 11"  (180.3 cm) Weight: 160 lb 7.9 oz (72.8 kg) IBW/kg (Calculated) : 75.3  Adjusted Body Weight:   Vital Signs: Temp: 98.4 F (36.9 C) (10/06 0433) Temp src: Oral (10/06 0433) BP: 163/88 mmHg (10/06 0200) Pulse Rate: 112  (10/06 0200) Intake/Output from previous day:   Intake/Output from this shift:    Labs:  Basename 07/20/12 0030  WBC 16.0*  HGB 16.3  PLT 197  LABCREA --  CREATININE 1.00   Estimated Creatinine Clearance: 81.9 ml/min (by C-G formula based on Cr of 1). No results found for this basename: VANCOTROUGH:2,VANCOPEAK:2,VANCORANDOM:2,GENTTROUGH:2,GENTPEAK:2,GENTRANDOM:2,TOBRATROUGH:2,TOBRAPEAK:2,TOBRARND:2,AMIKACINPEAK:2,AMIKACINTROU:2,AMIKACIN:2, in the last 72 hours   Microbiology: No results found for this or any previous visit (from the past 720 hour(s)).  Medical History: Past Medical History  Diagnosis Date  . MS (multiple sclerosis)     bedridden since 1986  . Neurogenic bladder     with suprapubic catheter  . Dysphagia   . Personal history of PE (pulmonary embolism)     on coumadin  . Hypertension     Medications:  Anti-infectives     Start     Dose/Rate Route Frequency Ordered Stop   07/20/12 0600   ceFEPIme (MAXIPIME) 1 g in dextrose 5 % 50 mL IVPB        1 g 100 mL/hr over 30 Minutes Intravenous 3 times per day 07/20/12 0520     07/19/12 2300   ceFEPIme (MAXIPIME) 2 g in dextrose 5 % 50 mL IVPB        2 g 100 mL/hr over 30 Minutes Intravenous To Emergency Dept 07/19/12 2215 07/20/12 0117         Assessment: Patient with sepsis/UTI.  First dose of antibiotics already given in ED.  Goal of Therapy:  Cefepime dosed based on patient weight and renal function   Plan:  Follow up culture results Cefepime 1gm iv q8hr  Darlina Guys, Jacquenette Shone Crowford 07/20/2012,5:21 AM

## 2012-07-20 NOTE — Consult Note (Signed)
WOC consult Note Reason for Consult:Per RN conducting patient's full body skin assessment, there are no open ulcers on patient's skin.  Patient has evidence of numerous previously healed ulcerations and, with low air loss mattress overlay and house prevention protocols (turning and repositioning, basic skin care, etc.)  No other interventions are needed.  RN cancelled WOC nurse Consult. I will not follow.  Please re-consult if needed. Thanks, Ladona Mow, MSN, RN, [facility], CWOCN [phone])

## 2012-07-20 NOTE — Progress Notes (Signed)
4:30 PM I agree with HPI/GPe and A/P per Dr. Adela Glimpse      Alert oriented pleasant and doing much better he states than prior Yes no specific complaints He is a little bit disoriented not sure which hospital hes at  BP 152/79  Pulse 87  Temp 98 F (36.7 C) (Oral)  Resp 17  Ht 5\' 11"  (1.803 m)  Wt 72.8 kg (160 lb 7.9 oz)  BMI 22.38 kg/m2  SpO2 99%  Chart Review  Admission 04/14/12 with AMS in setting of Suprapubic cath and UTI  Admission 07/03/11 for UTI  Admit 02/10/11 c UTI, MRSA bacteremia  Admit 12/03/2008 c Psuedomonas UTI/AMS/Oropharyngeal dysphagia  Admit 10/25/2008 c PNA, Stage 3 sacral decub  Admission 04/24/08 with ileus, fecal impaction, bacteriuria  Admit 10/31/2007 c Psuedomonas UTI +sepsis  Admit 10/09/07 c Vol depletion, Viral GE  Admission 04/23/05 c bronchitis  Admission 10/03/03 with VDRF 2/2 to aspiration PNA  Admit 07/17/03 c Aspiration PNA  Admit 04/19/03 C LLL pna  Admit 12/22/03 c LLL PNA  Patient Active Problem List  Diagnosis  . Multiple sclerosis  . HYPERTENSION, BENIGN ESSENTIAL  . NEUROGENIC BLADDER  . URINARY TRACT INFECTION, RECURRENT  . DECUBITUS ULCER, SACRUM  . DYSPHAGIA UNSPECIFIED  . PULMONARY EMBOLISM, HX OF  . PERSON LIVING IN RESIDENTIAL INSTITUTION  . Weight loss, abnormal  . Encephalopathy acute  . UTI (lower urinary tract infection)   Patient will continue on cefepime monotherapy his urine culture is pending still his Foley catheter suprapubic was changed in the emergency room and he would need this closely monitored. I tried to contact his next of kin his daughter but did not have a number in the chart Pleas Koch, MD [facility] [phone]

## 2012-07-20 NOTE — ED Provider Notes (Cosign Needed)
History     CSN: [id number]  Arrival date & time 07/19/12  2135   First MD Initiated Contact with Patient 07/19/12 2204      Chief Complaint  Patient presents with  . Tachycardia  . Urinary Tract Infection    (Consider location/radiation/quality/duration/timing/severity/associated sxs/prior treatment) HPI Patient presents today from nursing home with reports of tachycardia and probable urinary tract infection. Patient has history of MS and is unable to move arms and is bedbound. He has a suprapubic catheter in place. Patient has a history of urinary tract infection and is a DO NOT RESUSCITATE. He denies any pain. He does state that he has been sweating over the past hour. He denies nausea or vomiting and states he has been taking by mouth well. Patient's history is difficult to obtain secondary to speech abnormality from his MS. Past Medical History  Diagnosis Date  . MS (multiple sclerosis)     bedridden since 1986  . Neurogenic bladder     with suprapubic catheter  . Dysphagia   . Personal history of PE (pulmonary embolism)     on coumadin  . Hypertension     Past Surgical History  Procedure Date  . Tonsillectomy and adenoidectomy   . Suprapubic catheter placement     11/11    No family history on file.  History  Substance Use Topics  . Smoking status: Never Smoker   . Smokeless tobacco: Not on file  . Alcohol Use: No      Review of Systems  Constitutional: Positive for chills. Negative for fever, diaphoresis, appetite change, fatigue and unexpected weight change.  HENT: Negative for mouth sores and neck stiffness.   Eyes: Negative for visual disturbance.  Respiratory: Negative for cough, chest tightness, shortness of breath and wheezing.   Cardiovascular: Negative for chest pain.  Gastrointestinal: Negative for nausea, vomiting, abdominal pain, diarrhea and constipation.  Genitourinary: Negative for dysuria, urgency, frequency and hematuria.  Skin: Negative  for rash.  Neurological: Negative for syncope, light-headedness and headaches.  Psychiatric/Behavioral: Negative for disturbed wake/sleep cycle. The patient is not nervous/anxious.     Allergies  Review of patient's allergies indicates no known allergies.  Home Medications   Current Outpatient Rx  Name Route Sig Dispense Refill  . CALCIUM CARBONATE ANTACID 500 MG PO CHEW Oral Chew 1 tablet by mouth daily.    Marland Kitchen CITALOPRAM HYDROBROMIDE 20 MG PO TABS Oral Take 30 mg by mouth at bedtime.     Marland Kitchen CRANBERRY 425 MG PO CAPS Oral Take 1 capsule by mouth 2 (two) times daily.    Marland Kitchen METOPROLOL TARTRATE 25 MG PO TABS Oral Take 12.5 mg by mouth 2 (two) times daily.     . ADULT MULTIVITAMIN W/MINERALS CH Oral Take 1 tablet by mouth daily.    . CYCLOBENZAPRINE HCL 10 MG PO TABS Oral Take 10 mg by mouth 3 (three) times daily as needed. For muscle spasms      BP 168/92  Pulse 147  Temp 100.9 F (38.3 C) (Rectal)  Resp 18  SpO2 97%  Physical Exam  Nursing note and vitals reviewed. Constitutional: He is oriented to person, place, and time.       Chronically ill-appearing male who is diaphoretic with contractured upper extremities  HENT:  Head: Normocephalic and atraumatic.       Mucous membranes are dry  Eyes:       Right eye is proptotic  Neck: Normal range of motion. Neck supple.  Cardiovascular: Tachycardia present.  Pulmonary/Chest: Effort normal and breath sounds normal.  Abdominal: Soft. Bowel sounds are normal.  Musculoskeletal:       Bilateral upper extremities are contractured bilateral lower extremities are stiff and I am able to range his legs   Neurological: He is alert and oriented to person, place, and time.       Patient is alert and oriented but is unable to do any purposeful movement below the neck  Skin:       Skin is diaphoretic    ED Course  Procedures (including critical care time)  Labs Reviewed  CBC WITH DIFFERENTIAL - Abnormal; Notable for the following:    WBC  16.0 (*)     Neutrophils Relative 78 (*)     Neutro Abs 12.5 (*)     Monocytes Absolute 1.6 (*)     All other components within normal limits  LACTIC ACID, PLASMA - Abnormal; Notable for the following:    Lactic Acid, Venous 3.5 (*)     All other components within normal limits  OCCULT BLOOD, POC DEVICE  CULTURE, BLOOD (ROUTINE X 2)  CULTURE, BLOOD (ROUTINE X 2)  COMPREHENSIVE METABOLIC PANEL  PROCALCITONIN  URINALYSIS, ROUTINE W REFLEX MICROSCOPIC  URINE CULTURE   Dg Chest Portable 1 View  07/20/2012  *RADIOLOGY REPORT*  Clinical Data: Central line placement.  PORTABLE CHEST - 1 VIEW  Comparison: 04/14/2012.  Findings: Interval right jugular catheter with its tip in the proximal superior vena cava.  No pneumothorax.  Clear lungs. Stable heart size at the upper limit of normal.  Unremarkable bones.  IMPRESSION: Right jugular catheter tip in the proximal superior vena cava without pneumothorax.   Original Report Authenticated By: Darrol Angel, M.D.      No diagnosis found.    Date: 07/20/2012  Rate: 116  Rhythm: sinus tachycardia  QRS Axis:left  Intervals: normal  ST/T Wave abnormalities: normal  Conduction Disutrbances:none  Narrative Interpretation:   Old EKG Reviewed: none available  Suprapubic catheter replaced by physician. The area was prepped and draped. The suprapubic catheter was placed under sterile conditions and 30 cc bulb blood per nursing. There is good urine drainage  MDM  Patient's care initially done under order set of ED sepsis. Consult was done with critical care. Maxipime IV was ordered. Patient had difficult IV access and central line was placed as per Dr. Otilio Carpen note. Patient is receiving first liter of normal saline. His heart rate has decreased to one from 145-125. He has maintained good venous pressure with systolic blood pressure currently at 145. Cultures have been sent. White blood cell count is elevated at 16,800.  Patient's care to  discussed with  Dr. Aggie Hacker  and she has assumed care.    CRITICAL CARE Performed by: Hilario Quarry   Total critical care time: 60  Critical care time was exclusive of separately billable procedures and treating other patients.  Critical care was necessary to treat or prevent imminent or life-threatening deterioration.  Critical care was time spent personally by me on the following activities: development of treatment plan with patient and/or surrogate as well as nursing, discussions with consultants, evaluation of patient's response to treatment, examination of patient, obtaining history from patient or surrogate, ordering and performing treatments and interventions, ordering and review of laboratory studies, ordering and review of radiographic studies, pulse oximetry and re-evaluation of patient's condition.     Hilario Quarry, MD 07/20/12 [id number]

## 2012-07-20 NOTE — Consult Note (Addendum)
WOC consult Note Reason for Consult:Multiples pressure ulcers. Patient not seen today; I will plan on seeing tomorrow.  Nevertheless, patient is on a therapeutic sleep surface at the extended care facility in which he resides; I will continue this therapeutic intervention while here. Thanks, Ladona Mow, MSN, RN, [facility], CWOCN [phone])

## 2012-07-20 NOTE — Plan of Care (Signed)
Problem: Phase I Progression Outcomes Goal: Pain controlled with appropriate interventions Outcome: Completed/Met Date Met:  07/20/12 Denies pain

## 2012-07-20 NOTE — Progress Notes (Addendum)
Patient has old pressure ulcers that are healed in the sacrum area.  Turned patient, placed pillow between knees, elevated feet.  Cancelled wound consult.

## 2012-07-20 NOTE — ED Notes (Signed)
Report called to floor nurse, Victorino Dike, RN. All questions answered.

## 2012-07-20 NOTE — Plan of Care (Signed)
Problem: Phase I Progression Outcomes Goal: OOB as tolerated unless otherwise ordered Outcome: Not Applicable Date Met:  07/20/12 Total care - MS

## 2012-07-20 NOTE — ED Provider Notes (Signed)
12:55 AM Procedure note:  Procedure: Central Venous Catheter  Consent: Verbal consent obtained from patient. Risks/Benefits reviewed and questions answered to his satisfaction  Indication: Need for venous access, Hemodynamic monitoring  Location: R IJ  Technique: Seldinger Technique. Ultrasound Assistance  Full Barrier precautions  Anesthesia: Local 3cc of 1% lidocaine w/o epi  20cm TLC inserted to 15cm  Blood return from all three ports and flushed easily  Sutured in place   BioPatch and sterile dressing applied  Pt tolerated procedure well with no apparent immediate complications  Post-procedure XR reviewed and line in satisfactory position. No pneumothorax.         Raeford Razor, MD 07/20/12 [phone]

## 2012-07-21 DIAGNOSIS — I10 Essential (primary) hypertension: Secondary | ICD-10-CM

## 2012-07-21 NOTE — Progress Notes (Signed)
CRITICAL VALUE ALERT  Critical value received: blood cultures aerobic bottle shows gram positive cocci in clusters   Date of notification:  07/21/12   Time of notification:  1030  Critical value read back:yes  Nurse who received alert:  Ernst Breach RN  MD notified (1st page):  Dr. Suzzanne Cloud   Time of first page:  1040  MD notified (2nd page):  Time of second page:  Responding MD:  Dr. Suzzanne Cloud  Time MD responded:  1045

## 2012-07-21 NOTE — Clinical Social Work Psychosocial (Unsigned)
     Clinical Social Work Department BRIEF PSYCHOSOCIAL ASSESSMENT 07/21/2012  Patient:  [name], [name]     Account Number:  [id number]     Admit date:  07/19/2012  Clinical Social Worker:  Hattie Perch  Date/Time:  07/21/2012 12:00 M  Referred by:  Physician  Date Referred:  07/21/2012 Referred for  SNF Placement   Other Referral:   Interview type:  Family Other interview type:    PSYCHOSOCIAL DATA Living Status:  FACILITY Admitted from facility:  [facility] Level of care:  Skilled Nursing Facility Primary support name:  tamica smith Primary support relationship to patient:  CHILD, ADULT Degree of support available:   good    CURRENT CONCERNS Current Concerns  Post-Acute Placement   Other Concerns:    SOCIAL WORK ASSESSMENT / PLAN CSW went to meet with patient. patient sleeping. CSW spoke with patients daughter, [name], she confirms that patient is a resident of [facility] and will return there upon discharge.   Assessment/plan status:   Other assessment/ plan:   Information/referral to community resources:    PATIENTS/FAMILYS RESPONSE TO PLAN OF CARE: agreeable to return to [facility] upon discharge.

## 2012-07-21 NOTE — Progress Notes (Signed)
Nutrition Brief Note  Patient screened for low braden.   Body mass index is 22.38 kg/(m^2). Pt meets criteria for normal healthy weight based on current BMI.   Wt Readings from Last 10 Encounters:  07/20/12 160 lb 7.9 oz (72.8 kg)  04/15/12 148 lb 5.9 oz (67.3 kg)  01/12/11 120 lb (54.432 kg)  01/08/11 117 lb 12.8 oz (53.434 kg)  10/18/10 123 lb (55.792 kg)    Current diet order is regular, patient is consuming approximately 100% of meals at this time. Labs and medications reviewed. Pt reports eating 3 meals/day PTA with great appetite. Pt reports unintended weight gain from great appetite, noted pt's weight up 12 pounds in the past 3 months. Pt with multiple sclerosis and is bedbound and unable to move arms. Pt seen by wound care nurse yesterday who noted pt with no open ulcers on skin however evidence of numerous previously healed ulcerations. No nutrition interventions warranted at this time. If nutrition issues arise, please consult RD. Recommend continued nursing assistance at mealtimes.   Levon Hedger MS, RD, LDN [phone] Pager [phone] After Hours Pager

## 2012-07-21 NOTE — Care Management Note (Signed)
    Page 1 of 1   07/23/2012     8:18:44 PM   CARE MANAGEMENT NOTE 07/23/2012  Patient:  [name], [name]   Account Number:  [id number]  Date Initiated:  07/21/2012  Documentation initiated by:  Lanier Clam  Subjective/Objective Assessment:   ADMITTED W/UTI.HX:MS,NEUROGENIC BLADDER     Action/Plan:   FROM SNF-[facility]   Anticipated DC Date:  07/23/2012   Anticipated DC Plan:  SKILLED NURSING FACILITY  In-house referral  Clinical Social Worker      DC Planning Services  CM consult      Choice offered to / List presented to:             Status of service:  Completed, signed off Medicare Important Message given?   (If response is "NO", the following Medicare IM given date fields will be blank) Date Medicare IM given:   Date Additional Medicare IM given:    Discharge Disposition:  SKILLED NURSING FACILITY  Per UR Regulation:  Reviewed for med. necessity/level of care/duration of stay  If discussed at Long Length of Stay Meetings, dates discussed:    Comments:  07/21/12 [facility] Tryson Lumley RN,BSN NCM [phone]

## 2012-07-21 NOTE — Progress Notes (Signed)
PROGRESS NOTE  [name] ZOX:[id number] DOB: [birth year] DOA: 07/19/2012 PCP: No primary provider on file.  Brief narrative: 61 yr old male admit 07/20/12 with Sepsis likely 2/2 to urinary origin in a setting of Multiple sclerosis  Past medical history-As per Problem list  Chart Review  Admit 04/14/12 with AMS in setting of Suprapubic cath and UTI  Admit 07/03/11 for UTI  Admit 02/10/11 c UTI, MRSA bacteremia  Admit 12/03/2008 c Psuedomonas UTI/AMS/Oropharyngeal dysphagia  Admit 10/25/2008 c PNA, Stage 3 sacral decub  Admit 04/24/08 with ileus, fecal impaction, bacteriuria  Admit 10/31/2007 c Psuedomonas UTI +sepsis  Admit 10/09/07 c Vol depletion, Viral GE  Admit 04/23/05 c bronchitis  Admit 10/03/03 with VDRF 2/2 to aspiration PNA  Admit 07/17/03 c Aspiration PNA  Admit 04/19/03 C LLL pna  Admit 12/22/03 c LLL PNA  Consultants:  None at present  Procedures:  See below  Antibiotics:  Cefepime 10/5  Uc pending  Blood Cult 1/2 +cocci in clusters     Subjective  Alert-oriented. No distress, ate full meals today.  No c/o No rfever, no n/v/cp/sob   Objective    Interim History: Reviewed  Telemetry: NSR-tele d/c 10/7  Objective: Filed Vitals:   07/21/12 0216 07/21/12 0525 07/21/12 0945 07/21/12 1300  BP: 131/71 142/75 117/71 126/72  Pulse: 87 85 81 87  Temp: 98 F (36.7 C) 97.6 F (36.4 C)  98 F (36.7 C)  TempSrc: Oral Oral    Resp: 20 20  15   Height:      Weight:      SpO2: 99% 94%  97%    Intake/Output Summary (Last 24 hours) at 07/21/12 1653 Last data filed at 07/21/12 1500  Gross per 24 hour  Intake 1522.5 ml  Output    800 ml  Net  722.5 ml    Exam:  General: alert, slow speech but clear, strabismus Cardiovascular: s1 s2 no m/r/g Respiratory: clear, no added sound Abdomen: soft, nt/nd with clean wound at suprapubic site Skinnad Neurocontractures, but able to move UE's freely  Data Reviewed: Basic Metabolic Panel:  Lab 07/20/12 8119  07/20/12 0030  NA 145 142  K 3.9 4.0  CL 114* 107  CO2 25 22  GLUCOSE 91 97  BUN 10 11  CREATININE 0.86 1.00  CALCIUM 8.7 9.8  MG 1.9 --  PHOS 2.4 --   Liver Function Tests:  Lab 07/20/12 0650 07/20/12 0030  AST 21 22  ALT 17 22  ALKPHOS 72 91  BILITOT 0.3 0.2*  PROT 6.7 8.3  ALBUMIN 3.0* 3.9   No results found for this basename: LIPASE:5,AMYLASE:5 in the last 168 hours No results found for this basename: AMMONIA:5 in the last 168 hours CBC:  Lab 07/20/12 0650 07/20/12 0030  WBC 11.5* 16.0*  NEUTROABS -- 12.5*  HGB 13.9 16.3  HCT 41.9 48.4  MCV 88.4 87.7  PLT 166 197   Cardiac Enzymes: No results found for this basename: CKTOTAL:5,CKMB:5,CKMBINDEX:5,TROPONINI:5 in the last 168 hours BNP: No components found with this basename: POCBNP:5 CBG: No results found for this basename: GLUCAP:5 in the last 168 hours  Recent Results (from the past 240 hour(s))  CULTURE, BLOOD (ROUTINE X 2)     Status: Normal (Preliminary result)   Collection Time   07/19/12 11:30 PM      Component Value Range Status Comment   Specimen Description BLOOD LEFT THUMB   Final    Special Requests BOTTLES DRAWN AEROBIC ONLY 4CC   Final  Culture  Setup Time 07/20/2012 11:38   Final    Culture     Final    Value: GRAM POSITIVE COCCI IN CLUSTERS     Note: Gram Stain Report Called to,Read Back By and Verified With: MERISSA LONG 07/21/12 1025 BY SMITHERSJ   Report Status PENDING   Incomplete   CULTURE, BLOOD (ROUTINE X 2)     Status: Normal (Preliminary result)   Collection Time   07/20/12  2:50 AM      Component Value Range Status Comment   Specimen Description BLOOD RIGHT THUMB   Final    Special Requests BOTTLES DRAWN AEROBIC ONLY 3CC   Final    Culture  Setup Time 07/20/2012 11:38   Final    Culture     Final    Value:        BLOOD CULTURE RECEIVED NO GROWTH TO DATE CULTURE WILL BE HELD FOR 5 DAYS BEFORE ISSUING A FINAL NEGATIVE REPORT   Report Status PENDING   Incomplete   MRSA PCR  SCREENING     Status: Normal   Collection Time   07/20/12  5:15 AM      Component Value Range Status Comment   MRSA by PCR NEGATIVE  NEGATIVE Final      Studies:              All Imaging reviewed and is as per above notation   Scheduled Meds:   . antiseptic oral rinse  15 mL Mouth Rinse BID  . ceFEPime (MAXIPIME) IV  1 g Intravenous Q8H  . citalopram  30 mg Oral QHS  . docusate sodium  100 mg Oral BID  . metoprolol tartrate  12.5 mg Oral BID  . sodium chloride  3 mL Intravenous Q12H  . DISCONTD: enoxaparin (LOVENOX) injection  40 mg Subcutaneous Q24H   Continuous Infusions:   . DISCONTD: sodium chloride 1,000 mL (07/21/12 0516)     Assessment/Plan: 1. Sepsis likely 2/2 to Urinary cause-Continue Cefepime.  CBC am, await formal blood and urine culture reports, WBC decreased.  Tol diet well, will d/c IVF 2. MS-continue home meds of cyclobenzaprine 10 po tid prn spasm 3. Depression-continue celexa 30 qhs 4. Htn-metoprolol 12.5 to continue  Code Status: DNR- Family Communication: none at bedside Disposition Plan: Inpatient   Pleas Koch, MD  [facility] Pager [phone] 07/21/2012, 4:53 PM    LOS: 2 days

## 2012-07-22 LAB — CBC WITH DIFFERENTIAL/PLATELET
HCT: 39.9 % (ref 39.0–52.0)
Hemoglobin: 13.7 g/dL (ref 13.0–17.0)
Lymphs Abs: 1.8 10*3/uL (ref 0.7–4.0)
Monocytes Absolute: 1 10*3/uL (ref 0.1–1.0)
Monocytes Relative: 9 % (ref 3–12)
Neutro Abs: 8.2 10*3/uL — ABNORMAL HIGH (ref 1.7–7.7)
Neutrophils Relative %: 74 % (ref 43–77)
RBC: 4.56 MIL/uL (ref 4.22–5.81)

## 2012-07-22 NOTE — Clinical Documentation Improvement (Signed)
SEPSIS DOCUMENTATION QUERY  THIS DOCUMENT IS NOT A PERMANENT PART OF THE MEDICAL RECORD   Please update your documentation within the medical record to reflect your response to this query.                                                                                     07/22/12  Dr. Mahala Menghini and/or Associates,  In a better effort to capture your patient's severity of illness, reflect appropriate length of stay and utilization of resources, a review of the patient medical record has revealed the following indicators:  "61 yr old male admit 07/20/12 with Sepsis likely 2/2 to urinary origin in a setting of Multiple sclerosis" Pleas Koch, MD  07/21/2012, 4:53 PM Progress Note   Based on your clinical judgment, please document in the progress notes and discharge summary if the diagnosis of Sepsi likely 2/2 to urinary origin was:  This was present on admission!!!   - Not Present on Admission   - Unable to Clinically Determine   In responding to this query please exercise your independent judgment.    The fact that a query is asked, does not imply that any particular answer is desired or expected.    Reviewed:  no additional documentation provided  Thank You,  Jerral Ralph  RN BSN CCDS Certified Clinical Documentation Specialist: Cell   [phone] [facility]  Health Information Management [place]  TO RESPOND TO THE THIS QUERY, FOLLOW THE INSTRUCTIONS BELOW:  1. If needed, update documentation for the patient's encounter via the notes activity.  2. Access this query again and click edit on the In [employer].  3. After updating, or not, click F2 to complete all highlighted (required) fields concerning your review. Select "additional documentation in the medical record" OR "no additional documentation provided".  4. Click Sign note button.  5. The deficiency will fall out of your In Basket *Please let us know if you are not able to complete this  workflow by phone or e-mail (listed below).

## 2012-07-22 NOTE — Progress Notes (Signed)
ANTIBIOTIC CONSULT NOTE - Follow Up  Pharmacy Consult for Cefepime Indication: rule out sepsis--UTI  No Known Allergies  Patient Measurements: Height: 5\' 11"  (180.3 cm) Weight: 160 lb 7.9 oz (72.8 kg) IBW/kg (Calculated) : 75.3    Vital Signs: Temp: 98.1 F (36.7 C) (10/08 0545) Temp src: Oral (10/08 0545) BP: 143/76 mmHg (10/08 0545) Pulse Rate: 83  (10/08 0545) Intake/Output from previous day: 10/07 0701 - 10/08 0700 In: 580 [P.O.:480; IV Piggyback:100] Out: 1900 [Urine:1900] Intake/Output from this shift: Total I/O In: 120 [P.O.:120] Out: -   Labs:  Basename 07/22/12 0500 07/20/12 0650 07/20/12 0030  WBC 11.2* 11.5* 16.0*  HGB 13.7 13.9 16.3  PLT 162 166 197  LABCREA -- -- --  CREATININE -- 0.86 1.00   Estimated Creatinine Clearance: 95.2 ml/min (by C-G formula based on Cr of 0.86). No results found for this basename: VANCOTROUGH:2,VANCOPEAK:2,VANCORANDOM:2,GENTTROUGH:2,GENTPEAK:2,GENTRANDOM:2,TOBRATROUGH:2,TOBRAPEAK:2,TOBRARND:2,AMIKACINPEAK:2,AMIKACINTROU:2,AMIKACIN:2, in the last 72 hours   Microbiology: Recent Results (from the past 720 hour(s))  CULTURE, BLOOD (ROUTINE X 2)     Status: Normal (Preliminary result)   Collection Time   07/19/12 11:30 PM      Component Value Range Status Comment   Specimen Description BLOOD LEFT THUMB   Final    Special Requests BOTTLES DRAWN AEROBIC ONLY 4CC   Final    Culture  Setup Time 07/20/2012 11:38   Final    Culture     Final    Value: STAPHYLOCOCCUS SPECIES (COAGULASE NEGATIVE)     Note: RIFAMPIN AND GENTAMICIN SHOULD NOT BE USED AS SINGLE DRUGS FOR TREATMENT OF STAPH INFECTIONS.     Note: Gram Stain Report Called to,Read Back By and Verified With: MERISSA LONG 07/21/12 1025 BY SMITHERSJ   Report Status PENDING   Incomplete   URINE CULTURE     Status: Normal (Preliminary result)   Collection Time   07/20/12  1:11 AM      Component Value Range Status Comment   Specimen Description URINE, SUPRAPUBIC   Final    Special Requests NONE   Final    Culture  Setup Time 07/20/2012 18:44   Final    Colony Count 60,000 COLONIES/ML   Final    Culture     Final    Value: STAPHYLOCOCCUS AUREUS     Note: RIFAMPIN AND GENTAMICIN SHOULD NOT BE USED AS SINGLE DRUGS FOR TREATMENT OF STAPH INFECTIONS.   Report Status PENDING   Incomplete   CULTURE, BLOOD (ROUTINE X 2)     Status: Normal (Preliminary result)   Collection Time   07/20/12  2:50 AM      Component Value Range Status Comment   Specimen Description BLOOD RIGHT THUMB   Final    Special Requests BOTTLES DRAWN AEROBIC ONLY 3CC   Final    Culture  Setup Time 07/20/2012 11:38   Final    Culture     Final    Value: GRAM POSITIVE COCCI IN CLUSTERS     Note: Gram Stain Report Called to,Read Back By and Verified With: MONICA CLAYTON @0537  ON 07/22/2012 BY MCLET   Report Status PENDING   Incomplete   MRSA PCR SCREENING     Status: Normal   Collection Time   07/20/12  5:15 AM      Component Value Range Status Comment   MRSA by PCR NEGATIVE  NEGATIVE Final     Medications:  Anti-infectives     Start     Dose/Rate Route Frequency Ordered Stop   07/20/12 0600  ceFEPIme (MAXIPIME) 1 g in dextrose 5 % 50 mL IVPB        1 g 100 mL/hr over 30 Minutes Intravenous 3 times per day 07/20/12 0520     07/19/12 2300   ceFEPIme (MAXIPIME) 2 g in dextrose 5 % 50 mL IVPB        2 g 100 mL/hr over 30 Minutes Intravenous To Emergency Dept 07/19/12 2215 07/20/12 0117         Assessment: 61 yo M on Day #3 cefepime for sepsis/UTI. Dose remains appropriate for renal function. UCx currently growing 60K S.aureus. BCx - 1 growing CoNS, 1 with GPC clusters.  Plan:  No changes to current cefepime regimen  Darrol Angel, PharmD Pager: [phone] 07/22/2012,11:04 AM

## 2012-07-22 NOTE — Progress Notes (Signed)
Lab called positive blood cultures clusters in aerobic bottle. Donnamarie Poag, NP notified of results.  No new orders at this time. Will continue to monitor. Newman Nip Everett

## 2012-07-22 NOTE — Progress Notes (Signed)
PROGRESS NOTE  Timothy WINEY ZOX:[id number] DOB: [birth year] DOA: 07/19/2012 PCP: No primary provider on file.  Brief narrative: 61 yr old male admit 07/20/12 with Sepsis likely 2/2 to urinary origin with a suprapubic catheter in a setting of Multiple sclerosis  Past medical history-As per Problem list  Chart Review  Admit 04/14/12 with AMS in setting of Suprapubic cath and UTI  Admit 07/03/11 for UTI  Admit 02/10/11 c UTI, MRSA bacteremia  Admit 12/03/2008 c Psuedomonas UTI/AMS/Oropharyngeal dysphagia  Admit 10/25/2008 c PNA, Stage 3 sacral decub  Admit 04/24/08 with ileus, fecal impaction, bacteriuria  Admit 10/31/2007 c Psuedomonas UTI +sepsis  Admit 10/09/07 c Vol depletion, Viral GE  Admit 04/23/05 c bronchitis  Admit 10/03/03 with VDRF 2/2 to aspiration PNA  Admit 07/17/03 c Aspiration PNA  Admit 04/19/03 C LLL pna  Admit 12/22/03 c LLL PNA  Consultants:  None at present  Procedures:  See below  Antibiotics:  Cefepime 10/5  Uc growing 60,000 colony-forming units of  Blood Cult 1/2 +cocci in clusters, next blood culture showing quadratus negative staph   Subjective  Alert-oriented. No distress, ate full meals today.  No c/o No fever, no n/v/cp/sob   Objective    Interim History: Reviewed  Telemetry: NSR-tele d/c 10/7  Objective: Filed Vitals:   07/21/12 1300 07/21/12 2113 07/22/12 0545 07/22/12 1523  BP: 126/72 139/87 143/76 141/82  Pulse: 87 87 83 76  Temp: 98 F (36.7 C) 98.5 F (36.9 C) 98.1 F (36.7 C) 98.2 F (36.8 C)  TempSrc:  Oral Oral Oral  Resp: 15 22 20 19   Height:      Weight:      SpO2: 97% 100% 98% 100%    Intake/Output Summary (Last 24 hours) at 07/22/12 1815 Last data filed at 07/22/12 1523  Gross per 24 hour  Intake    460 ml  Output   2150 ml  Net  -1690 ml    Exam:  General: alert, slow speech but clear, strabismus Cardiovascular: s1 s2 no m/r/g Respiratory: clear, no added sound Abdomen: soft, nt/nd with clean wound at  suprapubic site Skinnad Neurocontractures, but able to move UE's freely  Data Reviewed: Basic Metabolic Panel:  Lab 07/20/12 8119 07/20/12 0030  NA 145 142  K 3.9 4.0  CL 114* 107  CO2 25 22  GLUCOSE 91 97  BUN 10 11  CREATININE 0.86 1.00  CALCIUM 8.7 9.8  MG 1.9 --  PHOS 2.4 --   Liver Function Tests:  Lab 07/20/12 0650 07/20/12 0030  AST 21 22  ALT 17 22  ALKPHOS 72 91  BILITOT 0.3 0.2*  PROT 6.7 8.3  ALBUMIN 3.0* 3.9   No results found for this basename: LIPASE:5,AMYLASE:5 in the last 168 hours No results found for this basename: AMMONIA:5 in the last 168 hours CBC:  Lab 07/22/12 0500 07/20/12 0650 07/20/12 0030  WBC 11.2* 11.5* 16.0*  NEUTROABS 8.2* -- 12.5*  HGB 13.7 13.9 16.3  HCT 39.9 41.9 48.4  MCV 87.5 88.4 87.7  PLT 162 166 197   Cardiac Enzymes: No results found for this basename: CKTOTAL:5,CKMB:5,CKMBINDEX:5,TROPONINI:5 in the last 168 hours BNP: No components found with this basename: POCBNP:5 CBG: No results found for this basename: GLUCAP:5 in the last 168 hours  Recent Results (from the past 240 hour(s))  CULTURE, BLOOD (ROUTINE X 2)     Status: Normal (Preliminary result)   Collection Time   07/19/12 11:30 PM      Component Value Range Status  Comment   Specimen Description BLOOD LEFT THUMB   Final    Special Requests BOTTLES DRAWN AEROBIC ONLY 4CC   Final    Culture  Setup Time 07/20/2012 11:38   Final    Culture     Final    Value: STAPHYLOCOCCUS SPECIES (COAGULASE NEGATIVE)     Note: RIFAMPIN AND GENTAMICIN SHOULD NOT BE USED AS SINGLE DRUGS FOR TREATMENT OF STAPH INFECTIONS.     Note: Gram Stain Report Called to,Read Back By and Verified With: MERISSA LONG 07/21/12 1025 BY SMITHERSJ   Report Status PENDING   Incomplete   URINE CULTURE     Status: Normal (Preliminary result)   Collection Time   07/20/12  1:11 AM      Component Value Range Status Comment   Specimen Description URINE, SUPRAPUBIC   Final    Special Requests NONE   Final      Culture  Setup Time 07/20/2012 18:44   Final    Colony Count 60,000 COLONIES/ML   Final    Culture     Final    Value: STAPHYLOCOCCUS AUREUS     Note: RIFAMPIN AND GENTAMICIN SHOULD NOT BE USED AS SINGLE DRUGS FOR TREATMENT OF STAPH INFECTIONS.   Report Status PENDING   Incomplete   CULTURE, BLOOD (ROUTINE X 2)     Status: Normal (Preliminary result)   Collection Time   07/20/12  2:50 AM      Component Value Range Status Comment   Specimen Description BLOOD RIGHT THUMB   Final    Special Requests BOTTLES DRAWN AEROBIC ONLY 3CC   Final    Culture  Setup Time 07/20/2012 11:38   Final    Culture     Final    Value: GRAM POSITIVE COCCI IN CLUSTERS     Note: Gram Stain Report Called to,Read Back By and Verified With: MONICA CLAYTON @0537  ON 07/22/2012 BY MCLET   Report Status PENDING   Incomplete   MRSA PCR SCREENING     Status: Normal   Collection Time   07/20/12  5:15 AM      Component Value Range Status Comment   MRSA by PCR NEGATIVE  NEGATIVE Final      Studies:              All Imaging reviewed and is as per above notation   Scheduled Meds:    . antiseptic oral rinse  15 mL Mouth Rinse BID  . ceFEPime (MAXIPIME) IV  1 g Intravenous Q8H  . citalopram  30 mg Oral QHS  . docusate sodium  100 mg Oral BID  . metoprolol tartrate  12.5 mg Oral BID  . sodium chloride  3 mL Intravenous Q12H   Continuous Infusions:    Assessment/Plan: 1. Sepsis likely 2/2 to Urinary cause with final culture is pending-Continue Cefepime.  CBC am, await formal blood and urine culture reports, WBC decreased.  Tol diet well, will d/c IVF 2. MS-continue home meds of cyclobenzaprine 10 po tid prn spasm 3. Depression-continue celexa 30 qhs 4. Htn-metoprolol 12.5 to continue  Code Status: DNR- Family Communication: none at bedside Disposition Plan: Inpatient-potentially able to discharge in one to 2 days once cultures return and patient demonstrates afebrile states on oral antibiotic   Pleas Koch,  MD  [facility] Pager [phone] 07/22/2012, 6:15 PM    LOS: 3 days

## 2012-07-23 DIAGNOSIS — G35 Multiple sclerosis: Secondary | ICD-10-CM

## 2012-07-23 DIAGNOSIS — N319 Neuromuscular dysfunction of bladder, unspecified: Secondary | ICD-10-CM

## 2012-07-23 DIAGNOSIS — N390 Urinary tract infection, site not specified: Secondary | ICD-10-CM

## 2012-07-23 DIAGNOSIS — G9340 Encephalopathy, unspecified: Secondary | ICD-10-CM

## 2012-07-23 LAB — CULTURE, BLOOD (ROUTINE X 2)

## 2012-07-23 LAB — URINE CULTURE: Colony Count: 60000

## 2012-07-23 MED ORDER — SULFAMETHOXAZOLE-TRIMETHOPRIM 800-160 MG PO TABS: 1.0000 | ORAL_TABLET | Freq: Two times a day (BID) | ORAL | Status: AC

## 2012-07-23 NOTE — Progress Notes (Signed)
Patient cleared for discharge. Packet copied and placed in wallaroo.  Esdras Delair C. Thaison Kolodziejski MSW, LCSW [phone]  

## 2012-07-23 NOTE — Progress Notes (Signed)
R IJ CVC removed per order - vaseline gauze dressing to site - pressure held x5 minutes. Patient 9nformed dressing should stay on x 24 hours.

## 2012-07-23 NOTE — Discharge Summary (Signed)
Physician Discharge Summary  [name] ZOX:[id number] DOB: [birth year] DOA: 07/19/2012  PCP: No primary provider on file.  Admit date: 07/19/2012 Discharge date: 07/23/2012  Discharge Diagnoses:  Active Problems:  Multiple sclerosis  HYPERTENSION, BENIGN ESSENTIAL  NEUROGENIC BLADDER  DECUBITUS ULCER, SACRUM  UTI (lower urinary tract infection)   Discharge Condition: stable and improved. Will be discharged back to SNF for further care and to finish antibiotics by mouth. Will benefit of suppressive therapy as an outpatient due to hx of recurrent UTI's. Will follow with PCP in 1-2 weeks. Rest of medical problems stable, will continue current medication regimen.  Diet recommendation: low sodium diet  Filed Weights   07/20/12 0500  Weight: 72.8 kg (160 lb 7.9 oz)    History of present illness:  has a past medical history of MS (multiple sclerosis); Neurogenic bladder; Dysphagia; Personal history of PE (pulmonary embolism); and Hypertension. Admitted from [facility] care center because of increase heart rate, sweating and feeling clammy. He was noted to be febrile eo 100.9. Never hypotensive. Patient has history of recurrent UTI in presence of suprapubic catheter.   Hospital Course:  1. Sirs/early Sepsis likely 2/2 to UTI: now afebrile, WBC's WNL and tolerating PO's. Will d/c on bactrim to complete antibiotic therapy. Patient will benefit of suppressive therapy as an outpatient. Advised to keep himself well hydrated and will follow with PCP in 1-2 weeks. 2. MS-continue home meds of cyclobenzaprine 10 po tid prn spasm 3. Depression-continue celexa 4. Htn-Stable; will continue metoprolol and low sodium diet.  Discharge Exam: Filed Vitals:   07/22/12 1523 07/22/12 2153 07/23/12 0518 07/23/12 0936  BP: 141/82 150/80 144/86 150/88  Pulse: 76 79 80 84  Temp: 98.2 F (36.8 C) 97.5 F (36.4 C) 98.4 F (36.9 C)   TempSrc: Oral Oral Oral   Resp: 19 20 18    Height:      Weight:       SpO2: 100% 100% 99%     General: NAD; AAOX3; afebrile Cardiovascular: S1 and S2; no rubs or gallops Respiratory: CTA bilaterally Abdomen: suprapubic catheter in place; no erythema or drainage appreciated; positive BS; NT,ND Extremities: trace edema bilaterally Neuro: no new focal deficit  Discharge Instructions  Discharge Orders    Future Orders Please Complete By Expires   Diet - low sodium heart healthy      Discharge instructions      Comments:   -Keep patient well hydrated -Take medications as prescribed -Follow up with PCP in 1-2 weeks after discharge       Medication List     As of 07/23/2012 11:35 AM    TAKE these medications         calcium carbonate 500 MG chewable tablet   Commonly known as: TUMS - dosed in mg elemental calcium   Chew 1 tablet by mouth daily.      citalopram 20 MG tablet   Commonly known as: CELEXA   Take 30 mg by mouth at bedtime.      Cranberry 425 MG Caps   Take 1 capsule by mouth 2 (two) times daily.      cyclobenzaprine 10 MG tablet   Commonly known as: FLEXERIL   Take 10 mg by mouth 3 (three) times daily as needed. For muscle spasms      metoprolol tartrate 25 MG tablet   Commonly known as: LOPRESSOR   Take 12.5 mg by mouth 2 (two) times daily.      multivitamin with minerals Tabs  Take 1 tablet by mouth daily.      sulfamethoxazole-trimethoprim 800-160 MG per tablet   Commonly known as: BACTRIM DS,SEPTRA DS   Take 1 tablet by mouth 2 (two) times daily.          The results of significant diagnostics from this hospitalization (including imaging, microbiology, ancillary and laboratory) are listed below for reference.    Significant Diagnostic Studies: Dg Chest Portable 1 View  07/20/2012  *RADIOLOGY REPORT*  Clinical Data: Central line placement.  PORTABLE CHEST - 1 VIEW  Comparison: 04/14/2012.  Findings: Interval right jugular catheter with its tip in the proximal superior vena cava.  No pneumothorax.  Clear lungs.  Stable heart size at the upper limit of normal.  Unremarkable bones.  IMPRESSION: Right jugular catheter tip in the proximal superior vena cava without pneumothorax.   Original Report Authenticated By: Darrol Angel, M.D.     Microbiology: Recent Results (from the past 240 hour(s))  CULTURE, BLOOD (ROUTINE X 2)     Status: Normal   Collection Time   07/19/12 11:30 PM      Component Value Range Status Comment   Specimen Description BLOOD LEFT THUMB   Final    Special Requests BOTTLES DRAWN AEROBIC ONLY 4CC   Final    Culture  Setup Time 07/20/2012 11:38   Final    Culture     Final    Value: STAPHYLOCOCCUS SPECIES (COAGULASE NEGATIVE)     Note: RIFAMPIN AND GENTAMICIN SHOULD NOT BE USED AS SINGLE DRUGS FOR TREATMENT OF STAPH INFECTIONS. This organism DOES NOT demonstrate inducible Clindamycin resistance in vitro.     Note: Gram Stain Report Called to,Read Back By and Verified With: MERISSA LONG 07/21/12 1025 BY SMITHERSJ   Report Status 07/23/2012 FINAL   Final    Organism ID, Bacteria STAPHYLOCOCCUS SPECIES (COAGULASE NEGATIVE)   Final   URINE CULTURE     Status: Normal   Collection Time   07/20/12  1:11 AM      Component Value Range Status Comment   Specimen Description URINE, SUPRAPUBIC   Final    Special Requests NONE   Final    Culture  Setup Time 07/20/2012 18:44   Final    Colony Count 60,000 COLONIES/ML   Final    Culture     Final    Value: METHICILLIN RESISTANT STAPHYLOCOCCUS AUREUS     Note: RIFAMPIN AND GENTAMICIN SHOULD NOT BE USED AS SINGLE DRUGS FOR TREATMENT OF STAPH INFECTIONS. CRITICAL RESULT CALLED TO, READ BACK BY AND VERIFIED WITH: KAITHLEEN KEY 07/23/12 AT 740 AM BY [facility]   Report Status 07/23/2012 FINAL   Final    Organism ID, Bacteria METHICILLIN RESISTANT STAPHYLOCOCCUS AUREUS   Final   CULTURE, BLOOD (ROUTINE X 2)     Status: Normal   Collection Time   07/20/12  2:50 AM      Component Value Range Status Comment   Specimen Description BLOOD RIGHT THUMB   Final     Special Requests BOTTLES DRAWN AEROBIC ONLY 3CC   Final    Culture  Setup Time 07/20/2012 11:38   Final    Culture     Final    Value: STAPHYLOCOCCUS SPECIES (COAGULASE NEGATIVE)     Note: SUSCEPTIBILITIES PERFORMED ON PREVIOUS CULTURE WITHIN THE LAST 5 DAYS.     Note: Gram Stain Report Called to,Read Back By and Verified With: MONICA CLAYTON @0537  ON 07/22/2012 BY MCLET   Report Status 07/23/2012 FINAL   Final  MRSA PCR SCREENING     Status: Normal   Collection Time   07/20/12  5:15 AM      Component Value Range Status Comment   MRSA by PCR NEGATIVE  NEGATIVE Final      Labs: Basic Metabolic Panel:  Lab 07/20/12 1610 07/20/12 0030  NA 145 142  K 3.9 4.0  CL 114* 107  CO2 25 22  GLUCOSE 91 97  BUN 10 11  CREATININE 0.86 1.00  CALCIUM 8.7 9.8  MG 1.9 --  PHOS 2.4 --   Liver Function Tests:  Lab 07/20/12 0650 07/20/12 0030  AST 21 22  ALT 17 22  ALKPHOS 72 91  BILITOT 0.3 0.2*  PROT 6.7 8.3  ALBUMIN 3.0* 3.9   CBC:  Lab 07/22/12 0500 07/20/12 0650 07/20/12 0030  WBC 11.2* 11.5* 16.0*  NEUTROABS 8.2* -- 12.5*  HGB 13.7 13.9 16.3  HCT 39.9 41.9 48.4  MCV 87.5 88.4 87.7  PLT 162 166 197   Time coordinating discharge: >30 minutes  Signed:  Cola Highfill  [facility] 07/23/2012, 11:35 AM

## 2012-09-16 ENCOUNTER — Other Ambulatory Visit (HOSPITAL_COMMUNITY): Payer: Self-pay | Admitting: Internal Medicine

## 2012-09-16 DIAGNOSIS — N390 Urinary tract infection, site not specified: Secondary | ICD-10-CM

## 2012-09-17 ENCOUNTER — Other Ambulatory Visit (HOSPITAL_COMMUNITY): Payer: Medicare Other

## 2012-09-17 ENCOUNTER — Other Ambulatory Visit (HOSPITAL_COMMUNITY): Payer: Self-pay | Admitting: Internal Medicine

## 2012-09-17 ENCOUNTER — Ambulatory Visit (HOSPITAL_COMMUNITY)
Admission: RE | Admit: 2012-09-17 | Discharge: 2012-09-17 | Disposition: A | Discharge status: Home / Self Care | Payer: Medicare Other | Source: Ambulatory Visit | Attending: Internal Medicine | Admitting: Internal Medicine

## 2012-09-17 DIAGNOSIS — N390 Urinary tract infection, site not specified: Secondary | ICD-10-CM

## 2012-09-17 DIAGNOSIS — Z86718 Personal history of other venous thrombosis and embolism: Secondary | ICD-10-CM | POA: Insufficient documentation

## 2012-09-17 MED ORDER — LIDOCAINE HCL 1 % IJ SOLN
INTRAMUSCULAR | Status: AC
  Filled 2012-09-17: qty 20

## 2012-09-17 NOTE — Procedures (Signed)
Successful placement (L) UE Midline SL PICC via brachial vein Length 26cm, tip lies in (L)subclavian vein No complications. Ready for use.

## 2012-12-21 DIAGNOSIS — Z7901 Long term (current) use of anticoagulants: Secondary | ICD-10-CM

## 2012-12-21 DIAGNOSIS — N319 Neuromuscular dysfunction of bladder, unspecified: Secondary | ICD-10-CM | POA: Diagnosis present

## 2012-12-21 DIAGNOSIS — D72829 Elevated white blood cell count, unspecified: Secondary | ICD-10-CM | POA: Diagnosis present

## 2012-12-21 DIAGNOSIS — F3289 Other specified depressive episodes: Secondary | ICD-10-CM | POA: Diagnosis present

## 2012-12-21 DIAGNOSIS — N390 Urinary tract infection, site not specified: Principal | ICD-10-CM | POA: Diagnosis present

## 2012-12-21 DIAGNOSIS — L8990 Pressure ulcer of unspecified site, unspecified stage: Secondary | ICD-10-CM

## 2012-12-21 DIAGNOSIS — Z936 Other artificial openings of urinary tract status: Secondary | ICD-10-CM

## 2012-12-21 DIAGNOSIS — Z79899 Other long term (current) drug therapy: Secondary | ICD-10-CM

## 2012-12-21 DIAGNOSIS — Z7401 Bed confinement status: Secondary | ICD-10-CM

## 2012-12-21 DIAGNOSIS — G35 Multiple sclerosis: Secondary | ICD-10-CM | POA: Diagnosis present

## 2012-12-21 DIAGNOSIS — R532 Functional quadriplegia: Secondary | ICD-10-CM | POA: Diagnosis present

## 2012-12-21 DIAGNOSIS — J189 Pneumonia, unspecified organism: Secondary | ICD-10-CM | POA: Diagnosis present

## 2012-12-21 DIAGNOSIS — I2782 Chronic pulmonary embolism: Secondary | ICD-10-CM | POA: Diagnosis present

## 2012-12-21 DIAGNOSIS — F329 Major depressive disorder, single episode, unspecified: Secondary | ICD-10-CM | POA: Diagnosis present

## 2012-12-21 DIAGNOSIS — L89109 Pressure ulcer of unspecified part of back, unspecified stage: Secondary | ICD-10-CM

## 2012-12-21 DIAGNOSIS — I10 Essential (primary) hypertension: Secondary | ICD-10-CM | POA: Diagnosis present

## 2012-12-22 ENCOUNTER — Encounter (HOSPITAL_COMMUNITY): Payer: Self-pay | Admitting: Emergency Medicine

## 2012-12-22 ENCOUNTER — Emergency Department (HOSPITAL_COMMUNITY): Payer: Medicare Other

## 2012-12-22 ENCOUNTER — Inpatient Hospital Stay (HOSPITAL_COMMUNITY)
Admission: EM | Admit: 2012-12-22 | Discharge: 2012-12-25 | DRG: 689 | Disposition: A | Discharge status: Skilled Nursing Facility | Payer: Medicare Other | Attending: Internal Medicine | Admitting: Internal Medicine

## 2012-12-22 DIAGNOSIS — N319 Neuromuscular dysfunction of bladder, unspecified: Secondary | ICD-10-CM

## 2012-12-22 DIAGNOSIS — R509 Fever, unspecified: Secondary | ICD-10-CM

## 2012-12-22 DIAGNOSIS — J069 Acute upper respiratory infection, unspecified: Secondary | ICD-10-CM

## 2012-12-22 DIAGNOSIS — N390 Urinary tract infection, site not specified: Principal | ICD-10-CM | POA: Diagnosis present

## 2012-12-22 DIAGNOSIS — J189 Pneumonia, unspecified organism: Secondary | ICD-10-CM | POA: Diagnosis present

## 2012-12-22 DIAGNOSIS — L89109 Pressure ulcer of unspecified part of back, unspecified stage: Secondary | ICD-10-CM

## 2012-12-22 DIAGNOSIS — I10 Essential (primary) hypertension: Secondary | ICD-10-CM | POA: Diagnosis present

## 2012-12-22 DIAGNOSIS — G35 Multiple sclerosis: Secondary | ICD-10-CM

## 2012-12-22 LAB — COMPREHENSIVE METABOLIC PANEL
BUN: 12 mg/dL (ref 6–23)
CO2: 21 mEq/L (ref 19–32)
Chloride: 102 mEq/L (ref 96–112)
Creatinine, Ser: 0.89 mg/dL (ref 0.50–1.35)
GFR calc non Af Amer: >90 mL/min (ref >90)
Glucose, Bld: 100 mg/dL — ABNORMAL HIGH (ref 70–99)
Total Bilirubin: 0.2 mg/dL — ABNORMAL LOW (ref 0.3–1.2)

## 2012-12-22 LAB — URINE MICROSCOPIC-ADD ON

## 2012-12-22 LAB — APTT: aPTT: 34 seconds (ref 24–37)

## 2012-12-22 LAB — CBC WITH DIFFERENTIAL/PLATELET
HCT: 47.4 % (ref 39.0–52.0)
Hemoglobin: 16.3 g/dL (ref 13.0–17.0)
Lymphocytes Relative: 21 % (ref 12–46)
Lymphs Abs: 2.5 10*3/uL (ref 0.7–4.0)
MCHC: 34.4 g/dL (ref 30.0–36.0)
Monocytes Absolute: 1 10*3/uL (ref 0.1–1.0)
Monocytes Relative: 9 % (ref 3–12)
Neutro Abs: 8.3 10*3/uL — ABNORMAL HIGH (ref 1.7–7.7)
WBC: 11.9 10*3/uL — ABNORMAL HIGH (ref 4.0–10.5)

## 2012-12-22 LAB — URINALYSIS, ROUTINE W REFLEX MICROSCOPIC
Bilirubin Urine: NEGATIVE
Glucose, UA: NEGATIVE mg/dL
Ketones, ur: NEGATIVE mg/dL
Nitrite: POSITIVE — AB
Protein, ur: 100 mg/dL — AB
Specific Gravity, Urine: 1.022 (ref 1.005–1.030)
Urobilinogen, UA: 0.2 mg/dL (ref 0.0–1.0)
pH: 5 (ref 5.0–8.0)

## 2012-12-22 MED ORDER — CITALOPRAM HYDROBROMIDE 20 MG PO TABS
20.0000 mg | ORAL_TABLET | Freq: Every day | ORAL | Status: DC
  Administered 2012-12-22 – 2012-12-24 (×3): 20 mg via ORAL
  Filled 2012-12-22 (×4): qty 1

## 2012-12-22 MED ORDER — SODIUM CHLORIDE 0.9 % IV BOLUS (SEPSIS)
500.0000 mL | Freq: Once | INTRAVENOUS | Status: AC
  Administered 2012-12-22: 500 mL via INTRAVENOUS

## 2012-12-22 MED ORDER — HYDROCODONE-ACETAMINOPHEN 5-325 MG PO TABS: 1.0000 | ORAL_TABLET | ORAL | Status: DC | PRN

## 2012-12-22 MED ORDER — ENOXAPARIN SODIUM 40 MG/0.4ML ~~LOC~~ SOLN
40.0000 mg | SUBCUTANEOUS | Status: DC
  Administered 2012-12-22 – 2012-12-25 (×4): 40 mg via SUBCUTANEOUS
  Filled 2012-12-22 (×4): qty 0.4

## 2012-12-22 MED ORDER — CALCIUM CARBONATE ANTACID 500 MG PO CHEW
1.0000 | CHEWABLE_TABLET | Freq: Every day | ORAL | Status: DC
  Administered 2012-12-23 – 2012-12-25 (×3): 200 mg via ORAL
  Filled 2012-12-22 (×3): qty 1

## 2012-12-22 MED ORDER — ACETAMINOPHEN 650 MG RE SUPP: 650.0000 mg | Freq: Four times a day (QID) | RECTAL | Status: DC | PRN

## 2012-12-22 MED ORDER — IPRATROPIUM BROMIDE 0.02 % IN SOLN: 0.5000 mg | Freq: Four times a day (QID) | RESPIRATORY_TRACT | Status: DC | PRN

## 2012-12-22 MED ORDER — MORPHINE SULFATE 2 MG/ML IJ SOLN: 1.0000 mg | INTRAMUSCULAR | Status: DC | PRN

## 2012-12-22 MED ORDER — DEXTROSE 5 % IV SOLN
1.0000 g | Freq: Two times a day (BID) | INTRAVENOUS | Status: DC
  Administered 2012-12-22 – 2012-12-25 (×7): 1 g via INTRAVENOUS
  Filled 2012-12-22 (×8): qty 1

## 2012-12-22 MED ORDER — ACETAMINOPHEN 325 MG PO TABS: 650.0000 mg | ORAL_TABLET | Freq: Four times a day (QID) | ORAL | Status: DC | PRN

## 2012-12-22 MED ORDER — CYCLOBENZAPRINE HCL 10 MG PO TABS
10.0000 mg | ORAL_TABLET | Freq: Three times a day (TID) | ORAL | Status: DC | PRN
  Filled 2012-12-22: qty 1

## 2012-12-22 MED ORDER — POLYVINYL ALCOHOL 1.4 % OP SOLN
2.0000 [drp] | Freq: Three times a day (TID) | OPHTHALMIC | Status: DC
  Administered 2012-12-22 – 2012-12-25 (×9): 2 [drp] via OPHTHALMIC
  Filled 2012-12-22 (×2): qty 15

## 2012-12-22 MED ORDER — ADULT MULTIVITAMIN W/MINERALS CH
1.0000 | ORAL_TABLET | Freq: Every day | ORAL | Status: DC
  Administered 2012-12-22 – 2012-12-25 (×4): 1 via ORAL
  Filled 2012-12-22 (×4): qty 1

## 2012-12-22 MED ORDER — ALBUTEROL SULFATE (5 MG/ML) 0.5% IN NEBU: 2.5000 mg | INHALATION_SOLUTION | Freq: Four times a day (QID) | RESPIRATORY_TRACT | Status: DC | PRN

## 2012-12-22 MED ORDER — HYDROMORPHONE HCL PF 1 MG/ML IJ SOLN: 1.0000 mg | Freq: Once | INTRAMUSCULAR | Status: DC

## 2012-12-22 MED ORDER — SODIUM CHLORIDE 0.9 % IV SOLN
INTRAVENOUS | Status: DC
  Administered 2012-12-22 – 2012-12-24 (×3): via INTRAVENOUS

## 2012-12-22 MED ORDER — METOPROLOL SUCCINATE 12.5 MG HALF TABLET
12.5000 mg | ORAL_TABLET | Freq: Two times a day (BID) | ORAL | Status: DC
  Administered 2012-12-22 – 2012-12-25 (×7): 12.5 mg via ORAL
  Filled 2012-12-22 (×9): qty 1

## 2012-12-22 NOTE — ED Notes (Signed)
Attempted to call report

## 2012-12-22 NOTE — Care Management (Signed)
Utilization review complete    [name] [phone]

## 2012-12-22 NOTE — H&P (Addendum)
[facility] History and Physical  [name] UYQ:[id number] DOB: June 07, 1951 DOA: 12/22/2012  Referring physician: ER physician PCP: No primary provider on file.   Chief Complaint:  fever   HPI:  62 year old male with past medical history significant for multiple sclerosis, bedbound, history of chronic urinary tract infections (Pseudomonas, enterococcus) who presented 12/22/2012 to [facility] with fevers for past few days prior to this admission. Patient has history of neurogenic bladder and chronic indwelling urinary catheter. The patient does not report abdominal pain, nausea or vomiting. No reports of chest pain, shortness of breath or palpitations. No lightheadedness or loss of consciousness. In ED, evaluation included abdominal x-ray which is still pending at this time. CBC revealed mild leukocytosis of 11.9. BMET essentially unremarkable. Urinalysis was positive for an infection. Patient was started on cefepime in ED.  Assessment and plan:  Principal Problem:   Fever, unspecified  Secondary to combination of urinary tract infection and pneumonia  Continue cefepime  Followup results of blood and urine culture Active Problems: URINARY TRACT INFECTION, RECURRENT  Secondary to neurogenic bladder and history of chronic indwelling catheter secondary to multiple sclerosis  Continue cefepime and followup urine culture results.  Followup abdominal x-ray result HYPERTENSION, BENIGN ESSENTIAL  Continue metoprolol Functional quadriplegia  Secondary to multiple sclerosis  Code Status: Full Family Communication: Pt at bedside Disposition Plan: Admit for further evaluation  Manson Passey, MD  [facility] Pager [phone]  If 7PM-7AM, please contact night-coverage [email or web address] Password [email or web address] 12/22/2012, 3:26 PM  Review of Systems:  Constitutional: Positive for fever, chills and no malaise/fatigue. Negative for diaphoresis.  HENT: Negative for hearing loss, ear pain, nosebleeds,  congestion, sore throat, neck pain, tinnitus and ear discharge.   Eyes: Negative for blurred vision, double vision, photophobia, pain, discharge and redness.  Respiratory: Negative for cough, hemoptysis, sputum production, shortness of breath, wheezing and stridor.   Cardiovascular: Negative for chest pain, palpitations, orthopnea, claudication and leg swelling.  Gastrointestinal: Negative for nausea, vomiting and abdominal pain. Negative for heartburn, constipation, blood in stool and melena.  Genitourinary: Negative for dysuria, urgency, frequency, hematuria and flank pain.  Musculoskeletal: Negative for myalgias, back pain, joint pain and falls.  Skin: Negative for itching and rash.  Neurological: Negative for dizziness and weakness. Negative for tingling, tremors, sensory change, speech change, focal weakness, loss of consciousness and headaches.  Endo/Heme/Allergies: Negative for environmental allergies and polydipsia. Does not bruise/bleed easily.  Psychiatric/Behavioral: Negative for suicidal ideas. The patient is not nervous/anxious.      Past Medical History  Diagnosis Date  . MS (multiple sclerosis)     bedridden since 1986  . Neurogenic bladder     with suprapubic catheter  . Dysphagia   . Personal history of PE (pulmonary embolism)     on coumadin  . Hypertension    Past Surgical History  Procedure Laterality Date  . Tonsillectomy and adenoidectomy    . Suprapubic catheter placement      11/11   Social History:  reports that he has never smoked. He has never used smokeless tobacco. He reports that he does not drink alcohol or use illicit drugs.  No Known Allergies  Family History:  Family History  Problem Relation Age of Onset  . Heart disease Father      Prior to Admission medications   Medication Sig Start Date End Date Taking? Authorizing Provider  acetaminophen (TYLENOL) 650 MG suppository Place 650 mg rectally every 6 (six) hours as needed for fever.   Yes  Historical Provider,  MD  calcium carbonate (TUMS - DOSED IN MG ELEMENTAL CALCIUM) 500 MG chewable tablet Chew 1 tablet by mouth daily.   Yes Historical Provider, MD  citalopram (CELEXA) 20 MG tablet Take 20 mg by mouth at bedtime.    Yes Historical Provider, MD  Cranberry 425 MG CAPS Take 1 capsule by mouth 2 (two) times daily.   Yes Historical Provider, MD  cyclobenzaprine (FLEXERIL) 10 MG tablet Take 10 mg by mouth 3 (three) times daily as needed. For muscle spasms   Yes Historical Provider, MD  ipratropium-albuterol (DUONEB) 0.5-2.5 (3) MG/3ML SOLN Take 3 mLs by nebulization every 6 (six) hours as needed. Wheezing and sob   Yes Historical Provider, MD  metoprolol succinate (TOPROL-XL) 25 MG 24 hr tablet Take 12.5 mg by mouth 2 (two) times daily.   Yes Historical Provider, MD  Multiple Vitamin (MULITIVITAMIN WITH MINERALS) TABS Take 1 tablet by mouth daily.   Yes Historical Provider, MD  polyvinyl alcohol (LIQUIFILM TEARS) 1.4 % ophthalmic solution Place 2 drops into both eyes 3 (three) times daily.   Yes Historical Provider, MD   Physical Exam: Filed Vitals:   12/22/12 0900 12/22/12 0930 12/22/12 1000 12/22/12 1100  BP: 139/79 128/79 129/67 125/85  Pulse:  90 86 86  Temp:    98.1 F (36.7 C)  TempSrc:    Oral  Resp: 25 21 19 18   Height:    5\' 11"  (1.803 m)  Weight:    75.9 kg (167 lb 5.3 oz)  SpO2:  97% 95% 98%    Physical Exam  Constitutional: Appears well-developed and well-nourished. No distress.  HENT: Normocephalic. External right and left ear normal. Oropharynx is clear and moist.  Eyes: Conjunctivae and EOM are normal. PERRLA, no scleral icterus.  Neck: Normal ROM. Neck supple. No JVD. No tracheal deviation. No thyromegaly.  CVS: RRR, S1/S2 +, no murmurs, no gallops, no carotid bruit.  Pulmonary:CTAB, no stridor, rhonchi, wheezes, rales.  Abdominal: Soft. BS +,  no distension, tenderness, rebound or guarding.  Musculoskeletal: Normal range of motion. No edema and no  tenderness.  Lymphadenopathy: No lymphadenopathy noted, cervical, inguinal. Neuro: Alert. Normal reflexes, muscle tone coordination. No cranial nerve deficit. Skin: Skin is warm and dry. No rash noted. Not diaphoretic. No erythema. No pallor.    Labs on Admission:  Basic Metabolic Panel:  Recent Labs Lab 12/22/12 0420 12/22/12 1225  NA 136  --   K 3.9  --   CL 102  --   CO2 21  --   GLUCOSE 100*  --   BUN 12  --   CREATININE 0.89  --   CALCIUM 10.2  --   MG  --  1.8  PHOS  --  4.0   Liver Function Tests:  Recent Labs Lab 12/22/12 0420  AST 21  ALT 27  ALKPHOS 84  BILITOT 0.2*  PROT 8.5*  ALBUMIN 3.7   No results found for this basename: LIPASE, AMYLASE,  in the last 168 hours No results found for this basename: AMMONIA,  in the last 168 hours CBC:  Recent Labs Lab 12/22/12 0420  WBC 11.9*  NEUTROABS 8.3*  HGB 16.3  HCT 47.4  MCV 86.7  PLT 178   Cardiac Enzymes: No results found for this basename: CKTOTAL, CKMB, CKMBINDEX, TROPONINI,  in the last 168 hours BNP: No components found with this basename: POCBNP,  CBG: No results found for this basename: GLUCAP,  in the last 168 hours  Radiological Exams on Admission: No  results found.  EKG: Normal sinus rhythm, no ST/T wave changes  Time spent: 75 minutes

## 2012-12-22 NOTE — Progress Notes (Addendum)
ANTIBIOTIC CONSULT NOTE - INITIAL  Pharmacy Consult for Cefepime Indication: ?UTI  No Known Allergies  Patient Measurements:   Adjusted Body Weight:  Vital Signs: Temp: 98.6 F (37 C) (03/10 0435) Temp src: Oral (03/10 0435) BP: 124/79 mmHg (03/10 0630) Pulse Rate: 99 (03/10 0435) Intake/Output from previous day:   Intake/Output from this shift:    Labs:  Recent Labs  12/22/12 0420  WBC 11.9*  HGB 16.3  PLT 178  CREATININE 0.89   The CrCl is unknown because both a height and weight (above a minimum accepted value) are required for this calculation. No results found for this basename: VANCOTROUGH, VANCOPEAK, VANCORANDOM, GENTTROUGH, GENTPEAK, GENTRANDOM, TOBRATROUGH, TOBRAPEAK, TOBRARND, AMIKACINPEAK, AMIKACINTROU, AMIKACIN,  in the last 72 hours   Microbiology: No results found for this or any previous visit (from the past 720 hour(s)).  Medical History: Past Medical History  Diagnosis Date  . MS (multiple sclerosis)     bedridden since 1986  . Neurogenic bladder     with suprapubic catheter  . Dysphagia   . Personal history of PE (pulmonary embolism)     on coumadin  . Hypertension     Assessment: 84 yom with h/o MS presented 3/10 from SNF with c/o constipation (last BM 3/6).  Abd X-ray pending. UA c/w UTI, urine culture and blood cultures pending. MD ordered for pharmacy to dose Cefepime. Of note, patient with h/o MRSA, enterococcus and pseudomonas UTI in the past (2013).    Pt is afebrile, WBC 11.9K, Scr 0.89, weight and height not updated.  Normalized CrCl = 91 ml/min  Plan:   Cefepime 1gm IV q12h  MD: If suspecting Enterococcus in urine, Cefepime will not cover. Please consider broaden abx to Imipenem or Zosyn to cover for both pseudomonas and enterococcus as per patient's history.  Pharmacy will f/u  Geoffry Paradise, PharmD, BCPS Pager: [phone] 8:48 AM Pharmacy #: [phone]

## 2012-12-22 NOTE — ED Notes (Signed)
RUE:[id number]<JW> Expected date:<BR> Expected time:<BR> Means of arrival:<BR> Comments:<BR> EMS/constipation from SNF

## 2012-12-22 NOTE — Progress Notes (Signed)
Pt turned and reposition, side to side. Incontinent of soft brown stool. Contracted arms and legs. Pt is a very pleasant man. Daughter called to check on her father and states she'll be up to visit tonight. Pt has a soft call bell on his chest.

## 2012-12-22 NOTE — ED Provider Notes (Addendum)
History     CSN: [id number]  Arrival date & time 12/21/12  2350   First MD Initiated Contact with Patient 12/22/12 [phone]      Chief Complaint  Patient presents with  . Constipation    (Consider location/radiation/quality/duration/timing/severity/associated sxs/prior treatment) HPI [name] is a 62 y.o. male history of multiple sclerosis and has been bedridden since 1986, patient also has a pertinent medical history of neurogenic bladder status post suprapubic catheter placement. Patient arrives with a history of constipation x2 days. Patient says his last bowel movement was Thursday and was soft. Patient says he's also had a fever and some chills.  She denies any chest pain, shortness of breath, he says he's had a cough that sounded wet but is been nonproductive. Denies any abdominal pain, nausea or vomiting and no diarrhea. No myalgias, arthralgias, rash.   Past Medical History  Diagnosis Date  . MS (multiple sclerosis)     bedridden since 1986  . Neurogenic bladder     with suprapubic catheter  . Dysphagia   . Personal history of PE (pulmonary embolism)     on coumadin  . Hypertension     Past Surgical History  Procedure Laterality Date  . Tonsillectomy and adenoidectomy    . Suprapubic catheter placement      11/11    Family History  Problem Relation Age of Onset  . Heart disease Father     History  Substance Use Topics  . Smoking status: Never Smoker   . Smokeless tobacco: Not on file  . Alcohol Use: No      Review of Systems At least 10pt or greater review of systems completed and are negative except where specified in the HPI.  Allergies  Review of patient's allergies indicates no known allergies.  Home Medications   Current Outpatient Rx  Name  Route  Sig  Dispense  Refill  . acetaminophen (TYLENOL) 650 MG suppository   Rectal   Place 650 mg rectally every 6 (six) hours as needed for fever.         . calcium carbonate (TUMS - DOSED IN MG  ELEMENTAL CALCIUM) 500 MG chewable tablet   Oral   Chew 1 tablet by mouth daily.         . citalopram (CELEXA) 20 MG tablet   Oral   Take 20 mg by mouth at bedtime.          . Cranberry 425 MG CAPS   Oral   Take 1 capsule by mouth 2 (two) times daily.         . cyclobenzaprine (FLEXERIL) 10 MG tablet   Oral   Take 10 mg by mouth 3 (three) times daily as needed. For muscle spasms         . ipratropium-albuterol (DUONEB) 0.5-2.5 (3) MG/3ML SOLN   Nebulization   Take 3 mLs by nebulization every 6 (six) hours as needed. Wheezing and sob         . metoprolol succinate (TOPROL-XL) 25 MG 24 hr tablet   Oral   Take 12.5 mg by mouth 2 (two) times daily.         . Multiple Vitamin (MULITIVITAMIN WITH MINERALS) TABS   Oral   Take 1 tablet by mouth daily.         . polyvinyl alcohol (LIQUIFILM TEARS) 1.4 % ophthalmic solution   Both Eyes   Place 2 drops into both eyes 3 (three) times daily.  BP 151/94  Pulse 111  Temp(Src) 97.8 F (36.6 C) (Oral)  Resp 20  SpO2 98%  Physical Exam  Nursing notes reviewed.  Electronic medical record reviewed. VITAL SIGNS:   Filed Vitals:   12/22/12 0435 12/22/12 0500 12/22/12 0530 12/22/12 0630  BP: 132/89 116/81 123/78 124/79  Pulse: 99     Temp: 98.6 F (37 C)     TempSrc: Oral     Resp: 19 22 21 20   SpO2: 96%      CONSTITUTIONAL: Awake, oriented x4, appears non-toxic but chronically ill. Mildly diaphoretic. Faint urine smell HENT: Atraumatic, normocephalic, oral mucosa pink and moist, airway patent. Nares patent without drainage. External ears normal. EYES: Conjunctiva clear, strabismus, exophthalmos, EOMI, PERRLA NECK: Trachea midline, non-tender, supple CARDIOVASCULAR: Tachycardic, Normal rhythm, No murmurs, rubs, gallops PULMONARY/CHEST: No wheezing or rales, some rhonchi throughout. No decreased breath sounds. Symmetrical breath sounds. Non-tender. ABDOMINAL: Non-distended, soft, non-tender - no rebound  or guarding.  BS normal. NEUROLOGIC: Patient can move extremities, somewhat contracted, muscular atrophy. EXTREMITIES: No clubbing, cyanosis. SKIN: Warm, Dry, No erythema, No rash  ED Course  Procedures (including critical care time)  Date: 12/22/2012  Rate: 103  Rhythm: Sinus tachycardia  QRS Axis: Left axis deviation  Intervals: normal  ST/T Wave abnormalities: normal  Conduction Disutrbances: none  Narrative Interpretation: unremarkable - possible remote anterior infarct  -no significant change from prior EKG dated 07/19/2012  Labs Reviewed  URINALYSIS, ROUTINE W REFLEX MICROSCOPIC - Abnormal; Notable for the following:    APPearance CLOUDY (*)    Hgb urine dipstick LARGE (*)    Protein, ur 100 (*)    Nitrite POSITIVE (*)    Leukocytes, UA LARGE (*)    All other components within normal limits  CBC WITH DIFFERENTIAL - Abnormal; Notable for the following:    WBC 11.9 (*)    Neutro Abs 8.3 (*)    All other components within normal limits  COMPREHENSIVE METABOLIC PANEL - Abnormal; Notable for the following:    Glucose, Bld 100 (*)    Total Protein 8.5 (*)    Total Bilirubin 0.2 (*)    All other components within normal limits  URINE MICROSCOPIC-ADD ON - Abnormal; Notable for the following:    Bacteria, UA MANY (*)    All other components within normal limits  CULTURE, BLOOD (ROUTINE X 2)  CULTURE, BLOOD (ROUTINE X 2)  URINE CULTURE  LACTIC ACID, PLASMA   No results found.   1. Urinary tract infection   2. Multiple sclerosis   3. URI (upper respiratory infection)    Medications  sodium chloride 0.9 % bolus 500 mL (not administered)  ceFEPIme (MAXIPIME) 1 g in dextrose 5 % 50 mL IVPB (not administered)      MDM  [name] is a 62 y.o. male presents with history of fever, tachycardia, with multiple sclerosis, he has indwelling catheter has had previous multiple urinary tract infections. Patient also has some rhonchi, do not auscultate any particular area of  consolidation in his lung fields.   Radiologists questions lower left lung opacity atelectasis versus infiltrate. Pneumonia is a possibility in this patient, his urinalysis is also consistent with urinary tract infection. Treat patient with broad-spectrum antibiotics.  Patient is given fluid bolus well.  Discussed with [facility] for admission for likely UTI I think pneumonia is less likely.  Jones Skene, MD 12/22/12 9629  Jones Skene, MD 12/22/12 5284

## 2012-12-22 NOTE — ED Notes (Addendum)
Pt arrived via EMS with a complaint of constipation x 1 day.  Pt appears diaphoretic. Pt has MS and is bed bound.  Pt states he had a bowel movement Thursday 12/18/12 but that it was small. Pt states he has not had a bowel movement today.  He states that his diaphoresis is a similar sign of his constipation he experience in the past.

## 2012-12-22 NOTE — ED Notes (Signed)
Pt repositioned on to his right side per his request

## 2012-12-23 LAB — MRSA PCR SCREENING: MRSA by PCR: NEGATIVE

## 2012-12-23 LAB — URINE CULTURE

## 2012-12-23 LAB — COMPREHENSIVE METABOLIC PANEL
ALT: 22 U/L (ref 0–53)
Alkaline Phosphatase: 75 U/L (ref 39–117)
CO2: 24 mEq/L (ref 19–32)
Calcium: 9 mg/dL (ref 8.4–10.5)
GFR calc Af Amer: >90 mL/min (ref >90)
GFR calc non Af Amer: >90 mL/min (ref >90)
Glucose, Bld: 89 mg/dL (ref 70–99)
Potassium: 4 mEq/L (ref 3.5–5.1)
Sodium: 141 mEq/L (ref 135–145)
Total Bilirubin: 0.4 mg/dL (ref 0.3–1.2)

## 2012-12-23 LAB — CBC
MCHC: 33.5 g/dL (ref 30.0–36.0)
Platelets: 144 10*3/uL — ABNORMAL LOW (ref 150–400)
RDW: 15.3 % (ref 11.5–15.5)

## 2012-12-23 LAB — GLUCOSE, CAPILLARY: Glucose-Capillary: 79 mg/dL (ref 70–99)

## 2012-12-23 MED ORDER — BIOTENE DRY MOUTH MT LIQD
15.0000 mL | Freq: Two times a day (BID) | OROMUCOSAL | Status: DC
  Administered 2012-12-23 – 2012-12-25 (×5): 15 mL via OROMUCOSAL

## 2012-12-23 NOTE — Progress Notes (Signed)
Physical Therapy Note  Order received and chart reviewed.  Pt from facility and appears to be total assist with ADLs, UE contractures, and unable to assist with rolling per RN (also "bedbound" with hx of MS per chart).  Pt not appropriate for skilled PT at this time.  Zenovia Jarred, PT, DPT 12/23/2012 Pager: [phone]

## 2012-12-23 NOTE — Progress Notes (Addendum)
[facility] PROGRESS NOTE  [name] ZOX:[id number] DOB: Sep 16, 1951 DOA: 12/22/2012 PCP: No primary provider on file.  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 -continue cefepime pending cultures - blood and urine -improving, decreased leukocytosis, follow 2.HTN -OK control on metoprolol 3.?Pneumonia -CXR with LLL opacity -atx vs infiltrate -continue abx with cefepime as above and follow cultures 4.MS with functional quadriplegia 5.Depression -continue citalopram  Code Status: full Family Communication: pt at bedside Disposition Plan: to home when medically ready   Consultants:  none  Procedures:  none  Antibiotics:  Cefepime -started on 3/10  HPI/Subjective: States he feels better today, denies n/v, denies cough and no SOB  Objective: Filed Vitals:   12/22/12 1000 12/22/12 1100 12/22/12 2026 12/23/12 0527  BP: 129/67 125/85 147/76 132/90  Pulse: 86 86 73 79  Temp:  98.1 F (36.7 C) 98.3 F (36.8 C) 97.1 F (36.2 C)  TempSrc:  Oral Oral Oral  Resp: 19 18 16 18   Height:  5\' 11"  (1.803 m)    Weight:  75.9 kg (167 lb 5.3 oz)    SpO2: 95% 98% 100% 100%    Intake/Output Summary (Last 24 hours) at 12/23/12 1006 Last data filed at 12/23/12 0527  Gross per 24 hour  Intake   1130 ml  Output   1100 ml  Net     30 ml   Filed Weights   12/22/12 1100  Weight: 75.9 kg (167 lb 5.3 oz)    Exam:   General: Alert and appropriate in NAD  Cardiovascular: RRR  Respiratory: clear anteriorly, no wheezes  Abdomen: soft +BS suprapubic catheter intact, site with no induration or drainage    Data Reviewed: Basic Metabolic Panel:  Recent Labs Lab 12/22/12 0420 12/22/12 1225 12/23/12 0426  NA 136  --  141  K 3.9  --  4.0  CL 102  --  107  CO2 21  --  24  GLUCOSE 100*  --  89  BUN 12  --  12  CREATININE 0.89  --  0.90  CALCIUM 10.2  --  9.0  MG  --  1.8  --    PHOS  --  4.0  --    Liver Function Tests:  Recent Labs Lab 12/22/12 0420 12/23/12 0426  AST 21 20  ALT 27 22  ALKPHOS 84 75  BILITOT 0.2* 0.4  PROT 8.5* 7.6  ALBUMIN 3.7 3.3*   No results found for this basename: LIPASE, AMYLASE,  in the last 168 hours No results found for this basename: AMMONIA,  in the last 168 hours CBC:  Recent Labs Lab 12/22/12 0420 12/23/12 0426  WBC 11.9* 7.7  NEUTROABS 8.3*  --   HGB 16.3 15.7  HCT 47.4 46.8  MCV 86.7 86.8  PLT 178 144*   Cardiac Enzymes: No results found for this basename: CKTOTAL, CKMB, CKMBINDEX, TROPONINI,  in the last 168 hours BNP (last 3 results) No results found for this basename: PROBNP,  in the last 8760 hours CBG: No results found for this basename: GLUCAP,  in the last 168 hours  Recent Results (from the past 240 hour(s))  CULTURE, BLOOD (ROUTINE X 2)     Status: None   Collection Time    12/22/12  4:30 AM      Result Value Range Status   Specimen Description BLOOD LEFT HAND   Final   Special Requests BOTTLES DRAWN AEROBIC AND ANAEROBIC 3CC  Final   Culture  Setup Time 12/22/2012 14:02   Final   Culture     Final   Value:        BLOOD CULTURE RECEIVED NO GROWTH TO DATE CULTURE WILL BE HELD FOR 5 DAYS BEFORE ISSUING A FINAL NEGATIVE REPORT   Report Status PENDING   Incomplete  CULTURE, BLOOD (ROUTINE X 2)     Status: None   Collection Time    12/22/12  4:33 AM      Result Value Range Status   Specimen Description BLOOD RIGHT HAND   Final   Special Requests BOTTLES DRAWN AEROBIC ONLY 3CC   Final   Culture  Setup Time 12/22/2012 14:01   Final   Culture     Final   Value:        BLOOD CULTURE RECEIVED NO GROWTH TO DATE CULTURE WILL BE HELD FOR 5 DAYS BEFORE ISSUING A FINAL NEGATIVE REPORT   Report Status PENDING   Incomplete  MRSA PCR SCREENING     Status: None   Collection Time    12/23/12  5:49 AM      Result Value Range Status   MRSA by PCR NEGATIVE  NEGATIVE Final   Comment:            The  GeneXpert MRSA Assay (FDA     approved for NASAL specimens     only), is one component of a     comprehensive MRSA colonization     surveillance program. It is not     intended to diagnose MRSA     infection nor to guide or     monitor treatment for     MRSA infections.     Studies: Dg Abd Acute W/chest  12/22/2012  *RADIOLOGY REPORT*  Clinical Data: Fever, constipation  ACUTE ABDOMEN SERIES (ABDOMEN 2 VIEW & CHEST 1 VIEW)  Comparison:  07/20/2012 chest radiograph  Findings:  Cardiomegaly.  Mild left lung base opacity.  Diffuse osteopenia.  Nonobstructive bowel gas pattern.  No overt free intraperitoneal air on the decubitus views.  Impression: Nonobstructive bowel gas pattern.  Mild left lung base opacity; atelectasis versus infiltrate.   Original Report Authenticated By: Jearld Lesch, M.D.     Scheduled Meds: . antiseptic oral rinse  15 mL Mouth Rinse BID  . calcium carbonate  1 tablet Oral Daily  . ceFEPime (MAXIPIME) IV  1 g Intravenous Q12H  . citalopram  20 mg Oral QHS  . enoxaparin (LOVENOX) injection  40 mg Subcutaneous Q24H  . metoprolol succinate  12.5 mg Oral BID  . multivitamin with minerals  1 tablet Oral Daily  . polyvinyl alcohol  2 drop Both Eyes TID   Continuous Infusions: . sodium chloride 75 mL/hr at 12/22/12 1200    Principal Problem:   Fever, unspecified Active Problems:   Multiple sclerosis   HYPERTENSION, BENIGN ESSENTIAL   NEUROGENIC BLADDER   URINARY TRACT INFECTION, RECURRENT   DECUBITUS ULCER, SACRUM    Time spent: 25    [facility]  [facility] Pager [phone]. If 7PM-7AM, please contact night-coverage at [email or web address], password [facility] 12/23/2012, 10:06 AM  LOS: 1 day

## 2012-12-23 NOTE — Clinical Documentation Improvement (Addendum)
SEPSIS DOCUMENTATION QUERY  THIS DOCUMENT IS NOT A PERMANENT PART OF THE MEDICAL RECORD  TO RESPOND TO THE THIS QUERY, FOLLOW THE INSTRUCTIONS BELOW:  1. If needed, update documentation for the patient's encounter via the notes activity.  2. Access this query again and click edit on the In [employer].  3. After updating, or not, click F2 to complete all highlighted (required) fields concerning your review. Select "additional documentation in the medical record" OR "no additional documentation provided".  4. Click Sign note button.  5. The deficiency will fall out of your In Basket *Please let us know if you are not able to complete this workflow by phone or e-mail (listed below).  Please update your documentation within the medical record to reflect your response to this query.                                                                                    12/23/12  Dear Dr. Suanne Marker, A/Associates,  In a better effort to capture your patient's severity of illness, reflect appropriate length of stay and utilization of resources, a review of the patient medical record has revealed the following indicators.    Based on your clinical judgment, please clarify and document in a progress note and/or discharge summary the clinical condition associated with the following supporting information:  In responding to this query please exercise your independent judgment.  The fact that a query is asked, does not imply that any particular answer is desired or expected.   Pt admitted with UTI and fever a review of the medical record reveals the following:   Elevated WBCs=11.9  Leukocytosis of 11.9 per HP  Tachycardia per ED note    Suprapubic catheter due to neurogenic bladder per H?P  Functional quadriplegia   Treatment  cefepime  Clarification Needed   Please clarify if there is a link between pt's UTI and suprapubic catheter in setting of multiple sclerosis and functional  quadriplegia.      Possible Clinical Conditions?  Septicemia / Sepsis Severe Sepsis Neutropenic Sepsis  Septic Shock Sepsis with UTI Sepsis due to an internal device ( suprapubic catheter )  Other Condition __________________ Cannot clinically Determine _______________  Risk Factors: Fever, UTI, PNA, functional quadriplegia, HTN,  Presenting Signs and Symptoms:  Diagnostics: Component     Latest Ref Rng 12/22/2012         7:25 AM  Nitrite     NEGATIVE POSITIVE (A)  Leukocytes, UA     NEGATIVE LARGE (A)  Specimen Description      URINE, CLEAN CATCH  Special Requests      NONE  Colony Count      >=100,000 COLONIES/ML  Culture      Multiple bacterial morphotypes present, none predominant. Suggest appropriate recollection if clinically indicated.  Report Status      12/23/2012 FINAL  WBC, UA     <3 WBC/hpf 11-20  RBC / HPF     <3 RBC/hpf 11-20  Bacteria, UA     RARE MANY (A)   Component     Latest Ref Rng 12/22/2012         4:20 AM  WBC  4.0 - 10.5 K/uL 11.9 (H)     Treatment: Listed above  Reviewed:  no additional documentation provided   Thank You,  [employer]. Ambrose Mantle RN, BSN, MSN/Inf, CCDS Clinical Documentation Specialist [facility] Pager: [phone]   Health Information Management [place]

## 2012-12-24 DIAGNOSIS — G35 Multiple sclerosis: Secondary | ICD-10-CM

## 2012-12-24 NOTE — Progress Notes (Signed)
[facility]                                                                                Patient Demographics  [name], is a 62 y.o. male, DOB - 1951-07-17, ZOX:[id number], WJX:[id number]  Admit date - 12/22/2012  Admitting Physician Alison Murray, MD  Outpatient Primary MD for the patient is No primary provider on file.  LOS - 2   Chief Complaint  Patient presents with  . Constipation        Assessment & Plan     1.Complicated UTI -in a patient with multiple sclerosis with  neurogenic bladder who has a chronic indwelling suprapubic catheter - Patient clinically much improved on empiric cefepime, follow cultures, suprapubic catheter changed on 12/24/2012, will monitor culture results.    2.HTN - good control on metoprolol     3.?Pneumonia - ?? CXR with LLL opacity - clinically infiltrate - continue abx with cefepime as above and follow cultures, much improved.    4.MS with functional quadriplegia     5.Depression -continue citalopram    Code Status: Full  Family Communication: patient  Disposition Plan: SNF   Procedures    Consults      DVT Prophylaxis  Lovenox    Lab Results  Component Value Date   PLT 144* 12/23/2012    Medications  Scheduled Meds: . antiseptic oral rinse  15 mL Mouth Rinse BID  . calcium carbonate  1 tablet Oral Daily  . ceFEPime (MAXIPIME) IV  1 g Intravenous Q12H  . citalopram  20 mg Oral QHS  . enoxaparin (LOVENOX) injection  40 mg Subcutaneous Q24H  . metoprolol succinate  12.5 mg Oral BID  . multivitamin with minerals  1 tablet Oral Daily  . polyvinyl alcohol  2 drop Both Eyes TID   Continuous Infusions: . sodium chloride 75 mL/hr at 12/24/12 0742   PRN Meds:.acetaminophen, acetaminophen, albuterol, cyclobenzaprine, HYDROcodone-acetaminophen, ipratropium, morphine injection  Antibiotics     Anti-infectives   Start     Dose/Rate Route Frequency Ordered Stop   12/22/12 0900  ceFEPIme  (MAXIPIME) 1 g in dextrose 5 % 50 mL IVPB     1 g 100 mL/hr over 30 Minutes Intravenous Every 12 hours 12/22/12 0850         Time Spent in minutes   35   SINGH,PRASHANT K M.D on 12/24/2012 at 11:30 AM  Between 7am to 7pm - Pager - [phone]  After 7pm go to [email or web address] - password [email or web address]  And look for the night coverage person covering for me after hours  [facility] Office  [phone]    Subjective:   [name] today has, No headache, No chest pain, No abdominal pain - No Nausea, No new weakness tingling or numbness, No Cough - SOB.   Objective:   Filed Vitals:   12/23/12 0527 12/23/12 1443 12/23/12 2231 12/24/12 0620  BP: 132/90 118/75 146/82 138/77  Pulse: 79 77 73 70  Temp: 97.1 F (36.2 C) 97.9 F (36.6 C) 97.7 F (36.5 C) 98.4 F (36.9 C)  TempSrc: Oral Oral Oral Oral  Resp: 18 18 16 16   Height:  Weight:    75.751 kg (167 lb)  SpO2: 100% 100% 98% 98%    Wt Readings from Last 3 Encounters:  12/24/12 75.751 kg (167 lb)  07/20/12 72.8 kg (160 lb 7.9 oz)  04/15/12 67.3 kg (148 lb 5.9 oz)     Intake/Output Summary (Last 24 hours) at 12/24/12 1130 Last data filed at 12/24/12 0612  Gross per 24 hour  Intake 2965.5 ml  Output   1100 ml  Net 1865.5 ml    Exam Awake Alert, Oriented X 3, No new F.N deficits, Normal affect, and diffuse weakness lower extremities worse than upper do to multiple sclerosis [place].AT,PERRAL Supple Neck,No JVD, No cervical lymphadenopathy appriciated.  Symmetrical Chest wall movement, Good air movement bilaterally, CTAB RRR,No Gallops,Rubs or new Murmurs, No Parasternal Heave +ve B.Sounds, Abd Soft, Non tender, No organomegaly appriciated, No rebound - guarding or rigidity. Suprapubic catheter in place No Cyanosis, Clubbing or edema, No new Rash or bruise     Data Review   Micro Results Recent Results (from the past 240 hour(s))  CULTURE, BLOOD (ROUTINE X 2)     Status: None   Collection Time     12/22/12  4:30 AM      Result Value Range Status   Specimen Description BLOOD LEFT HAND   Final   Special Requests BOTTLES DRAWN AEROBIC AND ANAEROBIC 3CC   Final   Culture  Setup Time 12/22/2012 14:02   Final   Culture     Final   Value:        BLOOD CULTURE RECEIVED NO GROWTH TO DATE CULTURE WILL BE HELD FOR 5 DAYS BEFORE ISSUING A FINAL NEGATIVE REPORT   Report Status PENDING   Incomplete  CULTURE, BLOOD (ROUTINE X 2)     Status: None   Collection Time    12/22/12  4:33 AM      Result Value Range Status   Specimen Description BLOOD RIGHT HAND   Final   Special Requests BOTTLES DRAWN AEROBIC ONLY 3CC   Final   Culture  Setup Time 12/22/2012 14:01   Final   Culture     Final   Value:        BLOOD CULTURE RECEIVED NO GROWTH TO DATE CULTURE WILL BE HELD FOR 5 DAYS BEFORE ISSUING A FINAL NEGATIVE REPORT   Report Status PENDING   Incomplete  URINE CULTURE     Status: None   Collection Time    12/22/12  7:25 AM      Result Value Range Status   Specimen Description URINE, CLEAN CATCH   Final   Special Requests NONE   Final   Culture  Setup Time 12/22/2012 15:53   Final   Colony Count >=100,000 COLONIES/ML   Final   Culture     Final   Value: Multiple bacterial morphotypes present, none predominant. Suggest appropriate recollection if clinically indicated.   Report Status 12/23/2012 FINAL   Final  MRSA PCR SCREENING     Status: None   Collection Time    12/23/12  5:49 AM      Result Value Range Status   MRSA by PCR NEGATIVE  NEGATIVE Final   Comment:            The GeneXpert MRSA Assay (FDA     approved for NASAL specimens     only), is one component of a     comprehensive MRSA colonization     surveillance program. It is not  intended to diagnose MRSA     infection nor to guide or     monitor treatment for     MRSA infections.    Radiology Reports Dg Abd Acute W/chest  12/22/2012  *RADIOLOGY REPORT*  Clinical Data: Fever, constipation  ACUTE ABDOMEN SERIES (ABDOMEN 2  VIEW & CHEST 1 VIEW)  Comparison:  07/20/2012 chest radiograph  Findings:  Cardiomegaly.  Mild left lung base opacity.  Diffuse osteopenia.  Nonobstructive bowel gas pattern.  No overt free intraperitoneal air on the decubitus views.  Impression: Nonobstructive bowel gas pattern.  Mild left lung base opacity; atelectasis versus infiltrate.   Original Report Authenticated By: Jearld Lesch, M.D.     CBC  Recent Labs Lab 12/22/12 0420 12/23/12 0426  WBC 11.9* 7.7  HGB 16.3 15.7  HCT 47.4 46.8  PLT 178 144*  MCV 86.7 86.8  MCH 29.8 29.1  MCHC 34.4 33.5  RDW 14.9 15.3  LYMPHSABS 2.5  --   MONOABS 1.0  --   EOSABS 0.1  --   BASOSABS 0.0  --     Chemistries   Recent Labs Lab 12/22/12 0420 12/22/12 1225 12/23/12 0426  NA 136  --  141  K 3.9  --  4.0  CL 102  --  107  CO2 21  --  24  GLUCOSE 100*  --  89  BUN 12  --  12  CREATININE 0.89  --  0.90  CALCIUM 10.2  --  9.0  MG  --  1.8  --   AST 21  --  20  ALT 27  --  22  ALKPHOS 84  --  75  BILITOT 0.2*  --  0.4   ------------------------------------------------------------------------------------------------------------------ estimated creatinine clearance is 94.1 ml/min (by C-G formula based on Cr of 0.9). ------------------------------------------------------------------------------------------------------------------ No results found for this basename: HGBA1C,  in the last 72 hours ------------------------------------------------------------------------------------------------------------------ No results found for this basename: CHOL, HDL, LDLCALC, TRIG, CHOLHDL, LDLDIRECT,  in the last 72 hours ------------------------------------------------------------------------------------------------------------------  Recent Labs  12/22/12 1225  TSH 2.683   ------------------------------------------------------------------------------------------------------------------ No results found for this basename: VITAMINB12,  FOLATE, FERRITIN, TIBC, IRON, RETICCTPCT,  in the last 72 hours  Coagulation profile  Recent Labs Lab 12/22/12 1225  INR 1.01    No results found for this basename: DDIMER,  in the last 72 hours  Cardiac Enzymes No results found for this basename: CK, CKMB, TROPONINI, MYOGLOBIN,  in the last 168 hours ------------------------------------------------------------------------------------------------------------------ No components found with this basename: POCBNP,

## 2012-12-24 NOTE — Clinical Social Work Psychosocial (Signed)
     Clinical Social Work Department BRIEF PSYCHOSOCIAL ASSESSMENT 12/24/2012  Patient:  [name], [name]     Account Number:  [id number]     Admit date:  12/21/2012  Clinical Social Worker:  Doree Albee  Date/Time:  12/24/2012 01:45 PM  Referred by:  CSW  Date Referred:  12/24/2012 Referred for  SNF Placement   Other Referral:   Interview type:  Patient Other interview type:    PSYCHOSOCIAL DATA Living Status:  FACILITY Admitted from facility:  [facility] Level of care:  Skilled Nursing Facility Primary support name:  [name] Primary support relationship to patient:  CHILD, ADULT Degree of support available:   strong    CURRENT CONCERNS Current Concerns  Post-Acute Placement   Other Concerns:    SOCIAL WORK ASSESSMENT / PLAN CSW met with pt at bedside to complete psychosocial assessment. CSW introduced self and csw role. Patient shared he is a resident at [facility] skilled nursing facility. Pt reports he plans to return when medically stable. Pt alert and oritented x3 and pleasant. Pt shared that he looks forward to returning to [place] [facility]. Pt requested csw to touch base with pt daughter [name].    CSW spoke with pt daughter who confirmed that patient does plan to return to [facility] when medically stable. Pt daughter is motivated to assist with any discharge plans. CSW confirmed with [facility] that patient can return when medically stable.    CSW will complete fl2 for MD signature and will place in patient shadow chart when completed.   Assessment/plan status:  Psychosocial Support/Ongoing Assessment of Needs Other assessment/ plan:   Information/referral to community resources:   none identified at this time    PATIENTS/FAMILYS RESPONSE TO PLAN OF CARE: Pt thanked csw for concern and support. Pt plans to return to [facility] health care when medically stable with support from pt daughter  [name].

## 2012-12-25 LAB — GLUCOSE, CAPILLARY

## 2012-12-25 MED ORDER — CEFPODOXIME PROXETIL 200 MG PO TABS: 200.0000 mg | ORAL_TABLET | Freq: Two times a day (BID) | ORAL | Status: DC

## 2012-12-25 NOTE — Progress Notes (Signed)
Report called to [place] at [facility].

## 2012-12-25 NOTE — Progress Notes (Signed)
Patient discharged to [facility] via [facility] 

## 2012-12-25 NOTE — Discharge Summary (Signed)
[facility]                                                                                   [name], is a 62 y.o. male  DOB 1951/08/15  MRN [id number].  Admission date:  12/22/2012  Discharge Date:  12/25/2012  Primary MD  No primary provider on file.  Admitting Physician  Alison Murray, MD  Admission Diagnosis  Multiple sclerosis [340] Urinary tract infection [599.0] URI (upper respiratory infection) [465.9]  Discharge Diagnosis     Principal Problem:   Fever, unspecified Active Problems:   Multiple sclerosis   HYPERTENSION, BENIGN ESSENTIAL   NEUROGENIC BLADDER   URINARY TRACT INFECTION, RECURRENT   DECUBITUS ULCER, SACRUM    Past Medical History  Diagnosis Date  . MS (multiple sclerosis)     bedridden since 1986  . Neurogenic bladder     with suprapubic catheter  . Dysphagia   . Personal history of PE (pulmonary embolism)     on coumadin  . Hypertension     Past Surgical History  Procedure Laterality Date  . Tonsillectomy and adenoidectomy    . Suprapubic catheter placement      11/11     Recommendations for primary care physician for things to follow:   Change suprapubic catheter every 2 weeks, monitor for signs of UTI.   Discharge Diagnoses:   Principal Problem:   Fever, unspecified Active Problems:   Multiple sclerosis   HYPERTENSION, BENIGN ESSENTIAL   NEUROGENIC BLADDER   URINARY TRACT INFECTION, RECURRENT   DECUBITUS ULCER, SACRUM    Discharge Condition: Stable   Diet recommendation: See Discharge Instructions below   Consults -    History of present illness and  Hospital Course:     Kindly see H&P for history of present illness and admission details, please review complete Labs, Consult reports and Test reports for all details in brief [name], is a 62 y.o. male, patient was admitted for fevers due to complicated UTI in the setting of multiple sclerosis with neurogenic bladder and suprapubic  catheter which is chronic, patient's urine cultures were suggestive of bad specimen, he responded well to IV cefepime, now completely afebrile and symptom free, his suprapubic catheter was changed here on 12/24/2012, he will be placed on oral Vantin which is third-generation cephalosporin for another 5 days, he has received 6 doses of IV cefepime and is now symptom-free. Will recommend changing suprapubic catheter every 2 weeks as before and to monitor for signs of UTI reoccurrence.   On admission he had chest x-ray with questionable left lower lobe opacity which is infiltrate clinically, would recommend incentive from a tree every hour while awake.   His chronic medical problem hypertension stable continue home medications and monitor and adjust as needed.   For his depression and MS with quadriplegia and neurogenic bladder outpatient followup with PCP, continuation of his home medications, followup with urologist as needed.       Today   Subjective:   [name] today has no headache,no chest abdominal pain,no new weakness tingling or numbness, feels much better.  Objective:   Blood pressure 137/73, pulse 76,  temperature 97.7 F (36.5 C), temperature source Oral, resp. rate 16, height 5\' 11"  (1.803 m), weight 83.3 kg (183 lb 10.3 oz), SpO2 98.00%.   Intake/Output Summary (Last 24 hours) at 12/25/12 1118 Last data filed at 12/25/12 0455  Gross per 24 hour  Intake    480 ml  Output   2400 ml  Net  -1920 ml    Exam Awake Alert, Oriented *3, No new F.N deficits, Normal affect Joes.AT,PERRAL Supple Neck,No JVD, No cervical lymphadenopathy appriciated.  Symmetrical Chest wall movement, Good air movement bilaterally, CTAB RRR,No Gallops,Rubs or new Murmurs, No Parasternal Heave +ve B.Sounds, Abd Soft, Non tender, No organomegaly appriciated, No rebound -guarding or rigidity. Suprapubic catheter has been changed and site appears stable No Cyanosis, Clubbing or edema, No new  Rash or bruise  Data Review   Major procedures and Radiology Reports - PLEASE review detailed and final reports for all details in brief -       Dg Abd Acute W/chest  12/22/2012  *RADIOLOGY REPORT*  Clinical Data: Fever, constipation  ACUTE ABDOMEN SERIES (ABDOMEN 2 VIEW & CHEST 1 VIEW)  Comparison:  07/20/2012 chest radiograph  Findings:  Cardiomegaly.  Mild left lung base opacity.  Diffuse osteopenia.  Nonobstructive bowel gas pattern.  No overt free intraperitoneal air on the decubitus views.  Impression: Nonobstructive bowel gas pattern.  Mild left lung base opacity; atelectasis versus infiltrate.   Original Report Authenticated By: Jearld Lesch, M.D.     Micro Results      Recent Results (from the past 240 hour(s))  CULTURE, BLOOD (ROUTINE X 2)     Status: None   Collection Time    12/22/12  4:30 AM      Result Value Range Status   Specimen Description BLOOD LEFT HAND   Final   Special Requests BOTTLES DRAWN AEROBIC AND ANAEROBIC 3CC   Final   Culture  Setup Time 12/22/2012 14:02   Final   Culture     Final   Value:        BLOOD CULTURE RECEIVED NO GROWTH TO DATE CULTURE WILL BE HELD FOR 5 DAYS BEFORE ISSUING A FINAL NEGATIVE REPORT   Report Status PENDING   Incomplete  CULTURE, BLOOD (ROUTINE X 2)     Status: None   Collection Time    12/22/12  4:33 AM      Result Value Range Status   Specimen Description BLOOD RIGHT HAND   Final   Special Requests BOTTLES DRAWN AEROBIC ONLY 3CC   Final   Culture  Setup Time 12/22/2012 14:01   Final   Culture     Final   Value:        BLOOD CULTURE RECEIVED NO GROWTH TO DATE CULTURE WILL BE HELD FOR 5 DAYS BEFORE ISSUING A FINAL NEGATIVE REPORT   Report Status PENDING   Incomplete  URINE CULTURE     Status: None   Collection Time    12/22/12  7:25 AM      Result Value Range Status   Specimen Description URINE, CLEAN CATCH   Final   Special Requests NONE   Final   Culture  Setup Time 12/22/2012 15:53   Final   Colony Count  >=100,000 COLONIES/ML   Final   Culture     Final   Value: Multiple bacterial morphotypes present, none predominant. Suggest appropriate recollection if clinically indicated.   Report Status 12/23/2012 FINAL   Final  MRSA PCR SCREENING  Status: None   Collection Time    12/23/12  5:49 AM      Result Value Range Status   MRSA by PCR NEGATIVE  NEGATIVE Final   Comment:            The GeneXpert MRSA Assay (FDA     approved for NASAL specimens     only), is one component of a     comprehensive MRSA colonization     surveillance program. It is not     intended to diagnose MRSA     infection nor to guide or     monitor treatment for     MRSA infections.     CBC w Diff: Lab Results  Component Value Date   WBC 7.7 12/23/2012   HGB 15.7 12/23/2012   HCT 46.8 12/23/2012   PLT 144* 12/23/2012   LYMPHOPCT 21 12/22/2012   BANDSPCT 0 09/13/2008   MONOPCT 9 12/22/2012   EOSPCT 1 12/22/2012   BASOPCT 0 12/22/2012    CMP: Lab Results  Component Value Date   NA 141 12/23/2012   K 4.0 12/23/2012   CL 107 12/23/2012   CO2 24 12/23/2012   BUN 12 12/23/2012   CREATININE 0.90 12/23/2012   PROT 7.6 12/23/2012   ALBUMIN 3.3* 12/23/2012   BILITOT 0.4 12/23/2012   ALKPHOS 75 12/23/2012   AST 20 12/23/2012   ALT 22 12/23/2012  .   Discharge Instructions     Follow with Primary MD  in 7 days   Get CBC, CMP, checked 7 days by Primary MD and again as instructed by your Primary MD. Get a 2 view Chest X ray done next visit.  Get Medicines reviewed and adjusted.  Please request your Prim.MD to go over all Hospital Tests and Procedure/Radiological results at the follow up, please get all Hospital records sent to your Prim MD by signing hospital release before you go home.  Activity: As tolerated with Full fall precautions use walker/cane & assistance as needed   Diet:  Heart Healthy  For Heart failure patients - Check your Weight same time everyday, if you gain over 2 pounds, or you develop in leg  swelling, experience more shortness of breath or chest pain, call your Primary MD immediately. Follow Cardiac Low Salt Diet and 1.8 lit/day fluid restriction.  Disposition SNF  If you experience worsening of your admission symptoms, develop shortness of breath, life threatening emergency, suicidal or homicidal thoughts you must seek medical attention immediately by calling 911 or calling your MD immediately  if symptoms less severe.  You Must read complete instructions/literature along with all the possible adverse reactions/side effects for all the Medicines you take and that have been prescribed to you. Take any new Medicines after you have completely understood and accpet all the possible adverse reactions/side effects.   Do not drive and provide baby sitting services if your were admitted for syncope or siezures until you have seen by Primary MD or a Neurologist and advised to do so again.   Follow-up Information   Follow up with PCP. Schedule an appointment as soon as possible for a visit in 1 week.        Discharge Medications     Medication List    TAKE these medications       acetaminophen 650 MG suppository  Commonly known as:  TYLENOL  Place 650 mg rectally every 6 (six) hours as needed for fever.     calcium carbonate 500 MG  chewable tablet  Commonly known as:  TUMS - dosed in mg elemental calcium  Chew 1 tablet by mouth daily.     cefpodoxime 200 MG tablet  Commonly known as:  VANTIN  Take 1 tablet (200 mg total) by mouth 2 (two) times daily.     citalopram 20 MG tablet  Commonly known as:  CELEXA  Take 20 mg by mouth at bedtime.     Cranberry 425 MG Caps  Take 1 capsule by mouth 2 (two) times daily.     cyclobenzaprine 10 MG tablet  Commonly known as:  FLEXERIL  Take 10 mg by mouth 3 (three) times daily as needed. For muscle spasms     ipratropium-albuterol 0.5-2.5 (3) MG/3ML Soln  Commonly known as:  DUONEB  Take 3 mLs by nebulization every 6 (six) hours  as needed. Wheezing and sob     metoprolol succinate 25 MG 24 hr tablet  Commonly known as:  TOPROL-XL  Take 12.5 mg by mouth 2 (two) times daily.     multivitamin with minerals Tabs  Take 1 tablet by mouth daily.     polyvinyl alcohol 1.4 % ophthalmic solution  Commonly known as:  LIQUIFILM TEARS  Place 2 drops into both eyes 3 (three) times daily.           Total Time in preparing paper work, data evaluation and todays exam - 35 minutes  Leroy Sea M.D on 12/25/2012 at 11:18 AM  [facility] Office  [phone]

## 2012-12-25 NOTE — Progress Notes (Signed)
Pt for discharge to [facility].  CSW faxed pt discharge information via [facility], provided RN phone number to call report, discussed with pt at bedside, and arranged ambulance transportation for pt to [facility], arranged for 1 pm.  No further social work needs identified at this time.  CSW signing off.   Jacklynn Lewis, MSW, LCSWA  Clinical Social Work [phone]

## 2012-12-28 LAB — CULTURE, BLOOD (ROUTINE X 2): Culture: NO GROWTH

## 2013-02-22 ENCOUNTER — Encounter (HOSPITAL_COMMUNITY): Payer: Self-pay | Admitting: *Deleted

## 2013-02-22 ENCOUNTER — Emergency Department (HOSPITAL_COMMUNITY)
Admission: EM | Admit: 2013-02-22 | Discharge: 2013-02-22 | Disposition: A | Discharge status: Skilled Nursing Facility | Payer: Medicare Other | Attending: Emergency Medicine | Admitting: Emergency Medicine

## 2013-02-22 DIAGNOSIS — N390 Urinary tract infection, site not specified: Secondary | ICD-10-CM | POA: Insufficient documentation

## 2013-02-22 DIAGNOSIS — I10 Essential (primary) hypertension: Secondary | ICD-10-CM | POA: Insufficient documentation

## 2013-02-22 DIAGNOSIS — Z79899 Other long term (current) drug therapy: Secondary | ICD-10-CM | POA: Insufficient documentation

## 2013-02-22 DIAGNOSIS — Z86711 Personal history of pulmonary embolism: Secondary | ICD-10-CM | POA: Insufficient documentation

## 2013-02-22 DIAGNOSIS — M2450 Contracture, unspecified joint: Secondary | ICD-10-CM | POA: Insufficient documentation

## 2013-02-22 DIAGNOSIS — N319 Neuromuscular dysfunction of bladder, unspecified: Secondary | ICD-10-CM | POA: Insufficient documentation

## 2013-02-22 DIAGNOSIS — G35 Multiple sclerosis: Secondary | ICD-10-CM | POA: Insufficient documentation

## 2013-02-22 LAB — URINALYSIS, ROUTINE W REFLEX MICROSCOPIC
Glucose, UA: NEGATIVE mg/dL
Ketones, ur: NEGATIVE mg/dL
Protein, ur: 30 mg/dL — AB
Urobilinogen, UA: 0.2 mg/dL (ref 0.0–1.0)

## 2013-02-22 LAB — COMPREHENSIVE METABOLIC PANEL
ALT: 23 U/L (ref 0–53)
AST: 31 U/L (ref 0–37)
Albumin: 3.3 g/dL — ABNORMAL LOW (ref 3.5–5.2)
Alkaline Phosphatase: 84 U/L (ref 39–117)
CO2: 21 mEq/L (ref 19–32)
Chloride: 103 mEq/L (ref 96–112)
Creatinine, Ser: 0.86 mg/dL (ref 0.50–1.35)
GFR calc non Af Amer: >90 mL/min (ref >90)
Potassium: 3.3 mEq/L — ABNORMAL LOW (ref 3.5–5.1)
Total Bilirubin: 0.3 mg/dL (ref 0.3–1.2)

## 2013-02-22 LAB — CBC WITH DIFFERENTIAL/PLATELET
Basophils Absolute: 0 10*3/uL (ref 0.0–0.1)
Basophils Relative: 0 % (ref 0–1)
HCT: 46.7 % (ref 39.0–52.0)
Hemoglobin: 16.3 g/dL (ref 13.0–17.0)
Lymphocytes Relative: 19 % (ref 12–46)
Monocytes Absolute: 0.8 10*3/uL (ref 0.1–1.0)
Neutro Abs: 4.6 10*3/uL (ref 1.7–7.7)
Neutrophils Relative %: 69 % (ref 43–77)
RDW: 14.8 % (ref 11.5–15.5)
WBC: 6.7 10*3/uL (ref 4.0–10.5)

## 2013-02-22 LAB — URINE MICROSCOPIC-ADD ON

## 2013-02-22 LAB — POCT I-STAT, CHEM 8
BUN: 12 mg/dL (ref 6–23)
Creatinine, Ser: 0.9 mg/dL (ref 0.50–1.35)
Glucose, Bld: 111 mg/dL — ABNORMAL HIGH (ref 70–99)
Hemoglobin: 16.3 g/dL (ref 13.0–17.0)
Sodium: 139 mEq/L (ref 135–145)
TCO2: 21 mmol/L (ref 0–100)

## 2013-02-22 MED ORDER — DEXTROSE 5 % IV SOLN
1.0000 g | Freq: Once | INTRAVENOUS | Status: AC
  Administered 2013-02-22: 1 g via INTRAVENOUS
  Filled 2013-02-22: qty 10

## 2013-02-22 MED ORDER — POTASSIUM CHLORIDE CRYS ER 20 MEQ PO TBCR
20.0000 meq | EXTENDED_RELEASE_TABLET | Freq: Once | ORAL | Status: AC
  Administered 2013-02-22: 20 meq via ORAL
  Filled 2013-02-22: qty 1

## 2013-02-22 MED ORDER — SODIUM CHLORIDE 0.9 % IV BOLUS (SEPSIS)
1000.0000 mL | Freq: Once | INTRAVENOUS | Status: AC
  Administered 2013-02-22: 1000 mL via INTRAVENOUS

## 2013-02-22 MED ORDER — ACETAMINOPHEN 325 MG PO TABS
650.0000 mg | ORAL_TABLET | Freq: Once | ORAL | Status: AC
  Administered 2013-02-22: 650 mg via ORAL
  Filled 2013-02-22: qty 2

## 2013-02-22 MED ORDER — CEFPODOXIME PROXETIL 100 MG PO TABS: 100.0000 mg | ORAL_TABLET | Freq: Two times a day (BID) | ORAL | Status: DC

## 2013-02-22 NOTE — ED Provider Notes (Signed)
History     CSN: [id number]  Arrival date & time 02/22/13  0148   First MD Initiated Contact with Patient 02/22/13 0401      Chief Complaint  Patient presents with  . Fever  . Urinary Tract Infection    (Consider location/radiation/quality/duration/timing/severity/associated sxs/prior treatment) HPI Hx per nursing home and PT, is bedridden with MS and has indwelling foley, developed fever tonight to 99 and EMS called, family worried due to h/o AMS with UTI in the past. No emesis, no rash, no cough, no complaints of pain and no new symptoms otherwise. Mild in severity.  Past Medical History  Diagnosis Date  . MS (multiple sclerosis)     bedridden since 1986  . Neurogenic bladder     with suprapubic catheter  . Dysphagia   . Personal history of PE (pulmonary embolism)     on coumadin  . Hypertension     Past Surgical History  Procedure Laterality Date  . Tonsillectomy and adenoidectomy    . Suprapubic catheter placement      11/11    Family History  Problem Relation Age of Onset  . Heart disease Father     History  Substance Use Topics  . Smoking status: Never Smoker   . Smokeless tobacco: Never Used  . Alcohol Use: No      Review of Systems  Constitutional: Negative for fever.  HENT: Negative for neck pain and neck stiffness.   Eyes: Negative for pain.  Respiratory: Negative for cough and shortness of breath.   Cardiovascular: Negative for chest pain.  Gastrointestinal: Negative for abdominal pain.  Genitourinary: Negative for hematuria and flank pain.  Musculoskeletal: Negative for back pain.  Skin: Negative for rash.  Neurological: Negative for headaches.  All other systems reviewed and are negative.    Allergies  Review of patient's allergies indicates no known allergies.  Home Medications   Current Outpatient Rx  Name  Route  Sig  Dispense  Refill  . calcium carbonate (TUMS - DOSED IN MG ELEMENTAL CALCIUM) 500 MG chewable tablet   Oral  Chew 1 tablet by mouth daily.         . cefTRIAXone (ROCEPHIN) 1 G injection   Intramuscular   Inject 1 g into the muscle once.         . citalopram (CELEXA) 20 MG tablet   Oral   Take 20 mg by mouth at bedtime.          . Cranberry 425 MG CAPS   Oral   Take 1 capsule by mouth 2 (two) times daily.         . metoprolol succinate (TOPROL-XL) 25 MG 24 hr tablet   Oral   Take 12.5 mg by mouth 2 (two) times daily.         . Multiple Vitamin (MULITIVITAMIN WITH MINERALS) TABS   Oral   Take 1 tablet by mouth daily.         Marland Kitchen acetaminophen (TYLENOL) 650 MG suppository   Rectal   Place 650 mg rectally every 6 (six) hours as needed for fever.         . cyclobenzaprine (FLEXERIL) 10 MG tablet   Oral   Take 10 mg by mouth 3 (three) times daily as needed. For muscle spasms         . ipratropium-albuterol (DUONEB) 0.5-2.5 (3) MG/3ML SOLN   Nebulization   Take 3 mLs by nebulization every 6 (six) hours as needed. Wheezing and sob         .  polyvinyl alcohol (LIQUIFILM TEARS) 1.4 % ophthalmic solution   Both Eyes   Place 2 drops into both eyes 3 (three) times daily.           BP 144/79  Pulse 113  Temp(Src) 98.7 F (37.1 C) (Oral)  Resp 18  SpO2 97%  Physical Exam  Constitutional: He is oriented to person, place, and time. He appears well-developed and well-nourished.  HENT:  Head: Normocephalic and atraumatic.  Mouth/Throat: Oropharynx is clear and moist.  Eyes: Conjunctivae and EOM are normal. Pupils are equal, round, and reactive to light.  Neck: Neck supple.  Cardiovascular: Normal rate, regular rhythm and intact distal pulses.   Pulmonary/Chest: Effort normal and breath sounds normal. No respiratory distress.  Abdominal: Soft. Bowel sounds are normal. He exhibits no distension. There is no tenderness.  Genitourinary:  Foley in place  Musculoskeletal:  UE/ LE contractures  Neurological: He is alert and oriented to person, place, and time.  No  unilateral deficits  Skin: Skin is warm and dry.    ED Course  Procedures (including critical care time)   Results for orders placed during the hospital encounter of 02/22/13  CBC WITH DIFFERENTIAL      Result Value Range   WBC 6.7  4.0 - 10.5 K/uL   RBC 5.50  4.22 - 5.81 MIL/uL   Hemoglobin 16.3  13.0 - 17.0 g/dL   HCT 72.5  36.6 - 44.0 %   MCV 84.9  78.0 - 100.0 fL   MCH 29.6  26.0 - 34.0 pg   MCHC 34.9  30.0 - 36.0 g/dL   RDW 34.7  42.5 - 95.6 %   Platelets 159  150 - 400 K/uL   Neutrophils Relative 69  43 - 77 %   Neutro Abs 4.6  1.7 - 7.7 K/uL   Lymphocytes Relative 19  12 - 46 %   Lymphs Abs 1.3  0.7 - 4.0 K/uL   Monocytes Relative 13 (*) 3 - 12 %   Monocytes Absolute 0.8  0.1 - 1.0 K/uL   Eosinophils Relative 0  0 - 5 %   Eosinophils Absolute 0.0  0.0 - 0.7 K/uL   Basophils Relative 0  0 - 1 %   Basophils Absolute 0.0  0.0 - 0.1 K/uL  COMPREHENSIVE METABOLIC PANEL      Result Value Range   Sodium 135  135 - 145 mEq/L   Potassium 3.3 (*) 3.5 - 5.1 mEq/L   Chloride 103  96 - 112 mEq/L   CO2 21  19 - 32 mEq/L   Glucose, Bld 119 (*) 70 - 99 mg/dL   BUN 13  6 - 23 mg/dL   Creatinine, Ser 3.87  0.50 - 1.35 mg/dL   Calcium 8.9  8.4 - 56.4 mg/dL   Total Protein 7.7  6.0 - 8.3 g/dL   Albumin 3.3 (*) 3.5 - 5.2 g/dL   AST 31  0 - 37 U/L   ALT 23  0 - 53 U/L   Alkaline Phosphatase 84  39 - 117 U/L   Total Bilirubin 0.3  0.3 - 1.2 mg/dL   GFR calc non Af Amer >90  >90 mL/min   GFR calc Af Amer >90  >90 mL/min  URINALYSIS, ROUTINE W REFLEX MICROSCOPIC      Result Value Range   Color, Urine YELLOW  YELLOW   APPearance TURBID (*) CLEAR   Specific Gravity, Urine 1.030  1.005 - 1.030   pH 8.0  5.0 -  8.0   Glucose, UA NEGATIVE  NEGATIVE mg/dL   Hgb urine dipstick MODERATE (*) NEGATIVE   Bilirubin Urine SMALL (*) NEGATIVE   Ketones, ur NEGATIVE  NEGATIVE mg/dL   Protein, ur 30 (*) NEGATIVE mg/dL   Urobilinogen, UA 0.2  0.0 - 1.0 mg/dL   Nitrite POSITIVE (*) NEGATIVE    Leukocytes, UA LARGE (*) NEGATIVE  URINE MICROSCOPIC-ADD ON      Result Value Range   WBC, UA 11-20  <3 WBC/hpf   RBC / HPF 3-6  <3 RBC/hpf   Bacteria, UA MANY (*) RARE   Casts HYALINE CASTS (*) NEGATIVE   Urine-Other MUCOUS PRESENT    POCT I-STAT, CHEM 8      Result Value Range   Sodium 139  135 - 145 mEq/L   Potassium 3.3 (*) 3.5 - 5.1 mEq/L   Chloride 108  96 - 112 mEq/L   BUN 12  6 - 23 mg/dL   Creatinine, Ser 1.61  0.50 - 1.35 mg/dL   Glucose, Bld 096 (*) 70 - 99 mg/dL   Calcium, Ion 0.45 (*) 1.13 - 1.30 mmol/L   TCO2 21  0 - 100 mmol/L   Hemoglobin 16.3  13.0 - 17.0 g/dL   HCT 40.9  81.1 - 91.4 %  CG4 I-STAT (LACTIC ACID)      Result Value Range   Lactic Acid, Venous 0.78  0.5 - 2.2 mmol/L   IV NS  IV rocephin  U Cx pending  Potassium provided  Plan discharge back to ECF with RX Vantin provided. No indication for admit at this time. Plan close PCP follow up MDM  UTI with LGF, no emesis or other infectious source by exam  IVFs. IV ABx  Labs reassuring  VS and nursing notes reviewed      Sunnie Nielsen, MD 02/22/13 [phone]

## 2013-02-22 NOTE — ED Notes (Signed)
Patient transport by to facility by [facility]

## 2013-02-22 NOTE — ED Notes (Signed)
ZDG:[id number]<IE> Expected date:02/22/13<BR> Expected time: 1:25 AM<BR> Means of arrival:Ambulance<BR> Comments:<BR> Fever, possible UTI

## 2013-02-22 NOTE — ED Notes (Signed)
Patient present in the ED with suprapubic catheter - draining malodorous urine.

## 2013-02-22 NOTE — ED Notes (Signed)
Per EMS pt from [facility], staff sent here for possible UTI and Fever, pt has foley catheter, dark color urine, 24G L wrist from facility, fluids were given by staff there, pt currently being treated for UTI, staff unable to tell EMS what pts normal is, pt shakes head yes or no. BP 128/98, HR 114, 96% RA.

## 2013-02-23 LAB — URINE CULTURE: Colony Count: >=100000

## 2013-07-27 ENCOUNTER — Emergency Department (HOSPITAL_COMMUNITY)
Admission: EM | Admit: 2013-07-27 | Discharge: 2013-07-27 | Disposition: A | Discharge status: Home / Self Care | Payer: Medicare Other | Attending: Emergency Medicine | Admitting: Emergency Medicine

## 2013-07-27 ENCOUNTER — Encounter (HOSPITAL_COMMUNITY): Payer: Self-pay | Admitting: Emergency Medicine

## 2013-07-27 ENCOUNTER — Emergency Department (HOSPITAL_COMMUNITY): Payer: Medicare Other

## 2013-07-27 DIAGNOSIS — Z9359 Other cystostomy status: Secondary | ICD-10-CM | POA: Insufficient documentation

## 2013-07-27 DIAGNOSIS — G35 Multiple sclerosis: Secondary | ICD-10-CM | POA: Insufficient documentation

## 2013-07-27 DIAGNOSIS — H5710 Ocular pain, unspecified eye: Secondary | ICD-10-CM | POA: Insufficient documentation

## 2013-07-27 DIAGNOSIS — N319 Neuromuscular dysfunction of bladder, unspecified: Secondary | ICD-10-CM | POA: Insufficient documentation

## 2013-07-27 DIAGNOSIS — H5712 Ocular pain, left eye: Secondary | ICD-10-CM

## 2013-07-27 DIAGNOSIS — I10 Essential (primary) hypertension: Secondary | ICD-10-CM | POA: Insufficient documentation

## 2013-07-27 DIAGNOSIS — Z79899 Other long term (current) drug therapy: Secondary | ICD-10-CM | POA: Insufficient documentation

## 2013-07-27 DIAGNOSIS — R4789 Other speech disturbances: Secondary | ICD-10-CM | POA: Insufficient documentation

## 2013-07-27 DIAGNOSIS — Z86711 Personal history of pulmonary embolism: Secondary | ICD-10-CM | POA: Insufficient documentation

## 2013-07-27 DIAGNOSIS — Z791 Long term (current) use of non-steroidal anti-inflammatories (NSAID): Secondary | ICD-10-CM | POA: Insufficient documentation

## 2013-07-27 LAB — CBC WITH DIFFERENTIAL/PLATELET
Basophils Absolute: 0 10*3/uL (ref 0.0–0.1)
Eosinophils Absolute: 0.1 10*3/uL (ref 0.0–0.7)
Eosinophils Relative: 1 % (ref 0–5)
HCT: 46.5 % (ref 39.0–52.0)
Lymphocytes Relative: 20 % (ref 12–46)
Lymphs Abs: 1.9 10*3/uL (ref 0.7–4.0)
MCH: 28.6 pg (ref 26.0–34.0)
MCV: 86.4 fL (ref 78.0–100.0)
Monocytes Absolute: 0.9 10*3/uL (ref 0.1–1.0)
Platelets: 184 10*3/uL (ref 150–400)
RDW: 15.8 % — ABNORMAL HIGH (ref 11.5–15.5)
WBC: 9.7 10*3/uL (ref 4.0–10.5)

## 2013-07-27 LAB — BASIC METABOLIC PANEL
CO2: 24 mEq/L (ref 19–32)
Calcium: 9.4 mg/dL (ref 8.4–10.5)
Creatinine, Ser: 0.82 mg/dL (ref 0.50–1.35)
GFR calc non Af Amer: >90 mL/min (ref >90)
Glucose, Bld: 94 mg/dL (ref 70–99)

## 2013-07-27 MED ORDER — FLUORESCEIN SODIUM 1 MG OP STRP
1.0000 | ORAL_STRIP | Freq: Once | OPHTHALMIC | Status: AC
  Administered 2013-07-27: 1 via OPHTHALMIC
  Filled 2013-07-27: qty 1

## 2013-07-27 NOTE — ED Notes (Signed)
Patient transported to CT 

## 2013-07-27 NOTE — ED Notes (Signed)
Pt from [facility] [phone]

## 2013-07-27 NOTE — ED Notes (Signed)
Discharge instructions reviewed with [facility] at [facility].

## 2013-07-27 NOTE — ED Notes (Signed)
Per ems, pt started having eye pain since last Friday, gotten worse over weekend. Mentation normally, painful left eye radiating into left face. History of dysphagia. Normal speech slightly slurring, no new onset slurring. No vision changes. EKG unremarkable. Denies dizziness, nausea, vomiting.

## 2013-07-27 NOTE — ED Provider Notes (Signed)
CSN: [id number]     Arrival date & time 07/27/13  0846 History   First MD Initiated Contact with Patient 07/27/13 [phone]     Chief Complaint  Patient presents with  . Hypertension  . Eye Pain   (Consider location/radiation/quality/duration/timing/severity/associated sxs/prior Treatment) Patient is a 62 y.o. male presenting with hypertension and eye pain. The history is provided by the patient, the EMS personnel and the nursing home.  Hypertension Pertinent negatives include no chest pain, no abdominal pain, no headaches and no shortness of breath.  Eye Pain Pertinent negatives include no chest pain, no abdominal pain, no headaches and no shortness of breath.   patient with extensive MS has been bedridden for many years according to past medical records since 1986. Patient has a suprapubic catheter due to neurogenic bladder. Has some dysphasia. Patient sent in from nursing facility with complaint of left eye pain. Also concerns for hypertension the patient's blood pressure here is 157/83. Patient states that the left eye pain comes and goes it's not constant sidewall and also the surrounding area of the eye. Patient denies wearing contacts. Patient states the pain is present it's about an 8/10. Currently no pain.  Past Medical History  Diagnosis Date  . MS (multiple sclerosis)     bedridden since 1986  . Neurogenic bladder     with suprapubic catheter  . Dysphagia   . Personal history of PE (pulmonary embolism)     on coumadin  . Hypertension    Past Surgical History  Procedure Laterality Date  . Tonsillectomy and adenoidectomy    . Suprapubic catheter placement      11/11   Family History  Problem Relation Age of Onset  . Heart disease Father    History  Substance Use Topics  . Smoking status: Never Smoker   . Smokeless tobacco: Never Used  . Alcohol Use: No    Review of Systems  Constitutional: Negative for fever.  HENT: Negative for congestion.   Eyes: Positive for  pain. Negative for discharge, redness and itching.  Respiratory: Negative for shortness of breath.   Cardiovascular: Negative for chest pain.  Gastrointestinal: Negative for abdominal pain.  Genitourinary: Negative for hematuria.  Musculoskeletal: Negative for back pain.  Skin: Negative for rash.  Neurological: Negative for headaches.  Hematological: Does not bruise/bleed easily.  Psychiatric/Behavioral: Negative for confusion.    Allergies  Review of patient's allergies indicates no known allergies.  Home Medications   Current Outpatient Rx  Name  Route  Sig  Dispense  Refill  . acetaminophen (TYLENOL) 650 MG suppository   Rectal   Place 650 mg rectally every 6 (six) hours as needed for fever.         . calcium carbonate (TUMS - DOSED IN MG ELEMENTAL CALCIUM) 500 MG chewable tablet   Oral   Chew 1 tablet by mouth daily.         . citalopram (CELEXA) 10 MG tablet   Oral   Take 15 mg by mouth daily.         . Cranberry 425 MG CAPS   Oral   Take 1 capsule by mouth 2 (two) times daily.         . cyclobenzaprine (FLEXERIL) 10 MG tablet   Oral   Take 10 mg by mouth 3 (three) times daily as needed. For muscle spasms         . docusate sodium (COLACE) 100 MG capsule   Oral   Take 100 mg  by mouth 2 (two) times daily.         Marland Kitchen ipratropium-albuterol (DUONEB) 0.5-2.5 (3) MG/3ML SOLN   Nebulization   Take 3 mLs by nebulization every 6 (six) hours as needed. Wheezing and sob         . meloxicam (MOBIC) 7.5 MG tablet   Oral   Take 7.5 mg by mouth daily.         . metoprolol succinate (TOPROL-XL) 25 MG 24 hr tablet   Oral   Take 12.5 mg by mouth 2 (two) times daily.         . Multiple Vitamin (MULITIVITAMIN WITH MINERALS) TABS   Oral   Take 1 tablet by mouth daily.         . polyethylene glycol (MIRALAX / GLYCOLAX) packet   Oral   Take 17 g by mouth daily.         . polyvinyl alcohol (LIQUIFILM TEARS) 1.4 % ophthalmic solution   Both Eyes    Place 2 drops into both eyes 3 (three) times daily.         . traMADol (ULTRAM) 50 MG tablet   Oral   Take 50 mg by mouth every 6 (six) hours as needed for pain.          BP 151/76  Pulse 74  Temp(Src) 98.2 F (36.8 C) (Oral)  Resp 18  SpO2 95% Physical Exam  Nursing note and vitals reviewed. Constitutional: He is oriented to person, place, and time. He appears well-developed and well-nourished. No distress.  HENT:  Head: Normocephalic and atraumatic.  Mouth/Throat: Oropharynx is clear and moist.  Eyes: Conjunctivae and EOM are normal. Pupils are equal, round, and reactive to light. Right eye exhibits no discharge. Left eye exhibits no discharge. No scleral icterus.  Fluoroscseen staining and black light without any evidence of corneal abrasion. Anterior chambers appear normal.  Neck: Normal range of motion.  Cardiovascular: Normal rate, regular rhythm and normal heart sounds.   Pulmonary/Chest: Effort normal and breath sounds normal. No respiratory distress.  Abdominal: Soft. There is no tenderness.  Musculoskeletal: Normal range of motion.  Neurological: He is alert and oriented to person, place, and time.  Neuro exam baseline for his MS  Skin: Skin is warm. No rash noted.    ED Course  Procedures (including critical care time) Labs Review Labs Reviewed  CBC WITH DIFFERENTIAL - Abnormal; Notable for the following:    RDW 15.8 (*)    All other components within normal limits  BASIC METABOLIC PANEL   Results for orders placed during the hospital encounter of 07/27/13  CBC WITH DIFFERENTIAL      Result Value Range   WBC 9.7  4.0 - 10.5 K/uL   RBC 5.38  4.22 - 5.81 MIL/uL   Hemoglobin 15.4  13.0 - 17.0 g/dL   HCT 16.1  09.6 - 04.5 %   MCV 86.4  78.0 - 100.0 fL   MCH 28.6  26.0 - 34.0 pg   MCHC 33.1  30.0 - 36.0 g/dL   RDW 40.9 (*) 81.1 - 91.4 %   Platelets 184  150 - 400 K/uL   Neutrophils Relative % 70  43 - 77 %   Neutro Abs 6.8  1.7 - 7.7 K/uL   Lymphocytes  Relative 20  12 - 46 %   Lymphs Abs 1.9  0.7 - 4.0 K/uL   Monocytes Relative 9  3 - 12 %   Monocytes Absolute 0.9  0.1 -  1.0 K/uL   Eosinophils Relative 1  0 - 5 %   Eosinophils Absolute 0.1  0.0 - 0.7 K/uL   Basophils Relative 0  0 - 1 %   Basophils Absolute 0.0  0.0 - 0.1 K/uL  BASIC METABOLIC PANEL      Result Value Range   Sodium 138  135 - 145 mEq/L   Potassium 3.7  3.5 - 5.1 mEq/L   Chloride 104  96 - 112 mEq/L   CO2 24  19 - 32 mEq/L   Glucose, Bld 94  70 - 99 mg/dL   BUN 14  6 - 23 mg/dL   Creatinine, Ser 1.91  0.50 - 1.35 mg/dL   Calcium 9.4  8.4 - 47.8 mg/dL   GFR calc non Af Amer >90  >90 mL/min   GFR calc Af Amer >90  >90 mL/min    Imaging Review Ct Head Wo Contrast  07/27/2013   CLINICAL DATA:  Left eye and face pain.  EXAM: CT HEAD WITHOUT CONTRAST  TECHNIQUE: Contiguous axial images were obtained from the base of the skull through the vertex without intravenous contrast.  COMPARISON:  CT scan dated 04/14/2012  FINDINGS: No mass lesion. No midline shift. No acute hemorrhage or hematoma. No extra-axial fluid collections. No evidence of acute infarction. Diffuse cerebral cortical and cerebellar atrophy. 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 No acute osseous abnormality. The orbits appear normal. Mastoid processes and middle ear cavities are clear.  IMPRESSION: No acute abnormality. 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   Electronically Signed   By: Geanie Cooley M.D.   On: 07/27/2013 10:26    EKG Interpretation   None       MDM   1. Multiple sclerosis   2. Eye pain, left    Patient's left thigh pain has resolved. No medication was given to improve it. Evaluation of the left cornea showed no abrasions no evidence of dry eye. CT of head without any acute changes to the brain. Patient's anterior chamber pupil response was normal clinically not consistent with acute glaucoma. Patient we discharged back to nursing facility.    Shelda Jakes, MD 07/27/13 1150

## 2013-11-22 ENCOUNTER — Encounter (HOSPITAL_COMMUNITY): Payer: Self-pay | Admitting: Emergency Medicine

## 2013-11-22 ENCOUNTER — Emergency Department (HOSPITAL_COMMUNITY)
Admission: EM | Admit: 2013-11-22 | Discharge: 2013-11-22 | Disposition: A | Discharge status: Home / Self Care | Payer: Medicare Other | Attending: Emergency Medicine | Admitting: Emergency Medicine

## 2013-11-22 DIAGNOSIS — Z9889 Other specified postprocedural states: Secondary | ICD-10-CM | POA: Insufficient documentation

## 2013-11-22 DIAGNOSIS — R61 Generalized hyperhidrosis: Secondary | ICD-10-CM | POA: Insufficient documentation

## 2013-11-22 DIAGNOSIS — Z79899 Other long term (current) drug therapy: Secondary | ICD-10-CM | POA: Insufficient documentation

## 2013-11-22 DIAGNOSIS — K5900 Constipation, unspecified: Secondary | ICD-10-CM | POA: Insufficient documentation

## 2013-11-22 DIAGNOSIS — Z8669 Personal history of other diseases of the nervous system and sense organs: Secondary | ICD-10-CM | POA: Insufficient documentation

## 2013-11-22 DIAGNOSIS — Z86711 Personal history of pulmonary embolism: Secondary | ICD-10-CM | POA: Insufficient documentation

## 2013-11-22 DIAGNOSIS — Z87448 Personal history of other diseases of urinary system: Secondary | ICD-10-CM | POA: Insufficient documentation

## 2013-11-22 DIAGNOSIS — N390 Urinary tract infection, site not specified: Secondary | ICD-10-CM | POA: Insufficient documentation

## 2013-11-22 DIAGNOSIS — R000 Tachycardia, unspecified: Secondary | ICD-10-CM | POA: Insufficient documentation

## 2013-11-22 DIAGNOSIS — Z791 Long term (current) use of non-steroidal anti-inflammatories (NSAID): Secondary | ICD-10-CM | POA: Insufficient documentation

## 2013-11-22 DIAGNOSIS — Z7901 Long term (current) use of anticoagulants: Secondary | ICD-10-CM | POA: Insufficient documentation

## 2013-11-22 DIAGNOSIS — I10 Essential (primary) hypertension: Secondary | ICD-10-CM | POA: Insufficient documentation

## 2013-11-22 LAB — URINALYSIS, ROUTINE W REFLEX MICROSCOPIC
Bilirubin Urine: NEGATIVE
Glucose, UA: NEGATIVE mg/dL
Ketones, ur: NEGATIVE mg/dL
Nitrite: POSITIVE — AB
PH: 6 (ref 5.0–8.0)
Protein, ur: 100 mg/dL — AB
SPECIFIC GRAVITY, URINE: 1.019 (ref 1.005–1.030)
UROBILINOGEN UA: 0.2 mg/dL (ref 0.0–1.0)

## 2013-11-22 LAB — URINE MICROSCOPIC-ADD ON

## 2013-11-22 LAB — POCT I-STAT, CHEM 8
BUN: 14 mg/dL (ref 6–23)
CHLORIDE: 109 meq/L (ref 96–112)
Calcium, Ion: 1.27 mmol/L (ref 1.13–1.30)
Creatinine, Ser: 0.9 mg/dL (ref 0.50–1.35)
GLUCOSE: 110 mg/dL — AB (ref 70–99)
HEMATOCRIT: 55 % — AB (ref 39.0–52.0)
HEMOGLOBIN: 18.7 g/dL — AB (ref 13.0–17.0)
POTASSIUM: 4.1 meq/L (ref 3.7–5.3)
SODIUM: 145 meq/L (ref 137–147)
TCO2: 22 mmol/L (ref 0–100)

## 2013-11-22 LAB — CBC
HEMATOCRIT: 50 % (ref 39.0–52.0)
Hemoglobin: 17 g/dL (ref 13.0–17.0)
MCH: 29.4 pg (ref 26.0–34.0)
MCHC: 34 g/dL (ref 30.0–36.0)
MCV: 86.5 fL (ref 78.0–100.0)
PLATELETS: 187 10*3/uL (ref 150–400)
RBC: 5.78 MIL/uL (ref 4.22–5.81)
RDW: 15.1 % (ref 11.5–15.5)
WBC: 11.8 10*3/uL — AB (ref 4.0–10.5)

## 2013-11-22 MED ORDER — CEFPODOXIME PROXETIL 100 MG PO TABS: 100.0000 mg | ORAL_TABLET | Freq: Two times a day (BID) | ORAL | Status: DC

## 2013-11-22 MED ORDER — SODIUM CHLORIDE 0.9 % IV BOLUS (SEPSIS)
1000.0000 mL | Freq: Once | INTRAVENOUS | Status: AC
  Administered 2013-11-22: 1000 mL via INTRAVENOUS

## 2013-11-22 MED ORDER — DEXTROSE 5 % IV SOLN
1.0000 g | Freq: Once | INTRAVENOUS | Status: AC
  Administered 2013-11-22: 1 g via INTRAVENOUS
  Filled 2013-11-22: qty 10

## 2013-11-22 NOTE — ED Provider Notes (Signed)
CSN: [id number]     Arrival date & time 11/22/13  [id number] History   First MD Initiated Contact with Patient 11/22/13 0424     Chief Complaint  Patient presents with  . Constipation  . Hypertension   (Consider location/radiation/quality/duration/timing/severity/associated sxs/prior Treatment) Patient is a 63 y.o. male presenting with constipation and hypertension.  Constipation Associated symptoms: no back pain, no fever, no nausea and no vomiting   Hypertension Pertinent negatives include no chest pain, no headaches and no shortness of breath.   Hx per PT and EMS - per SNF report elevated BP today and periods of diaphoresis, has h/o HTN, MS, neurogenic bladder, suprapubic cath, and PE.  PT complains of constipation for the last 3 days had a large BM after arrival to the ER and states he is much better without any further complaints. No F/C, no N/V. No Cp or SOB, no ABD pain.   He has been taking colace and miralax and SNF  Past Medical History  Diagnosis Date  . MS (multiple sclerosis)     bedridden since 1986  . Neurogenic bladder     with suprapubic catheter  . Dysphagia   . Personal history of PE (pulmonary embolism)     on coumadin  . Hypertension    Past Surgical History  Procedure Laterality Date  . Tonsillectomy and adenoidectomy    . Suprapubic catheter placement      11/11   Family History  Problem Relation Age of Onset  . Heart disease Father    History  Substance Use Topics  . Smoking status: Never Smoker   . Smokeless tobacco: Never Used  . Alcohol Use: No    Review of Systems  Constitutional: Negative for fever and chills.  Respiratory: Negative for shortness of breath.   Cardiovascular: Negative for chest pain.  Gastrointestinal: Positive for constipation. Negative for nausea and vomiting.  Genitourinary: Negative for flank pain.  Musculoskeletal: Negative for back pain.  Skin: Negative for rash.  Neurological: Negative for headaches.  All other  systems reviewed and are negative.    Allergies  Review of patient's allergies indicates no known allergies.  Home Medications   Current Outpatient Rx  Name  Route  Sig  Dispense  Refill  . acetaminophen (TYLENOL) 650 MG suppository   Rectal   Place 650 mg rectally every 6 (six) hours as needed for fever.         . calcium carbonate (TUMS - DOSED IN MG ELEMENTAL CALCIUM) 500 MG chewable tablet   Oral   Chew 1 tablet by mouth daily.         . citalopram (CELEXA) 10 MG tablet   Oral   Take 15 mg by mouth daily.         . Cranberry 425 MG CAPS   Oral   Take 1 capsule by mouth 2 (two) times daily.         . cyclobenzaprine (FLEXERIL) 10 MG tablet   Oral   Take 10 mg by mouth 3 (three) times daily as needed. For muscle spasms         . docusate sodium (COLACE) 100 MG capsule   Oral   Take 100 mg by mouth 2 (two) times daily.         Marland Kitchen. ipratropium-albuterol (DUONEB) 0.5-2.5 (3) MG/3ML SOLN   Nebulization   Take 3 mLs by nebulization every 6 (six) hours as needed. Wheezing and sob         . meloxicam (  MOBIC) 7.5 MG tablet   Oral   Take 7.5 mg by mouth daily.         . metoprolol succinate (TOPROL-XL) 25 MG 24 hr tablet   Oral   Take 12.5 mg by mouth 2 (two) times daily.         . Multiple Vitamin (MULITIVITAMIN WITH MINERALS) TABS   Oral   Take 1 tablet by mouth daily.         . polyethylene glycol (MIRALAX / GLYCOLAX) packet   Oral   Take 17 g by mouth daily.         . polyvinyl alcohol (LIQUIFILM TEARS) 1.4 % ophthalmic solution   Both Eyes   Place 2 drops into both eyes 3 (three) times daily.         . traMADol (ULTRAM) 50 MG tablet   Oral   Take 50 mg by mouth every 6 (six) hours as needed for pain.          BP 155/105  Pulse 101  Temp(Src) 98.4 F (36.9 C) (Oral)  Resp 22  SpO2 95% Physical Exam  Constitutional: He is oriented to person, place, and time. He appears well-developed and well-nourished.  HENT:  Head:  Normocephalic and atraumatic.  Eyes: EOM are normal. Pupils are equal, round, and reactive to light.  Neck: Neck supple.  Cardiovascular: Regular rhythm and intact distal pulses.   tachycardic  Pulmonary/Chest: Effort normal and breath sounds normal. No respiratory distress. He exhibits no tenderness.  Abdominal: Soft. There is no tenderness. There is no rebound and no guarding.  Musculoskeletal:  Contractures, equal distal pulses  Neurological: He is alert and oriented to person, place, and time.  Skin: Skin is warm and dry. No rash noted.    ED Course  Procedures (including critical care time) Labs Review Labs Reviewed  CBC - Abnormal; Notable for the following:    WBC 11.8 (*)    All other components within normal limits  URINALYSIS, ROUTINE W REFLEX MICROSCOPIC - Abnormal; Notable for the following:    APPearance CLOUDY (*)    Hgb urine dipstick LARGE (*)    Protein, ur 100 (*)    Nitrite POSITIVE (*)    Leukocytes, UA LARGE (*)    All other components within normal limits  URINE MICROSCOPIC-ADD ON - Abnormal; Notable for the following:    Bacteria, UA FEW (*)    All other components within normal limits  POCT I-STAT, CHEM 8 - Abnormal; Notable for the following:    Glucose, Bld 110 (*)    Hemoglobin 18.7 (*)    HCT 55.0 (*)    All other components within normal limits  URINE CULTURE   Patient had a large bowel movement in the ED and is feeling much better.  IV antibiotics for UTI. Patient is comfortable plan discharge back to the facility, continue medications as prescribed with prescription for antibiotics provided. Plan close primary care followup. MDM  Diagnosis: Constipation. UTI.  Labs urinalysis reveals above Cardiac monitoring Heart rate normalized Medications provided Vital signs/ nurses notes reviewed  Sunnie Nielsen, MD 11/24/13 0730

## 2013-11-22 NOTE — ED Notes (Signed)
Pt has a foley catheter PTA, states has it changed once a month, pt denies pain or discomfort at this time, pt is diaphoretic, denies dizziness, lightheadedness. Pt resting comfortably at this time in no apparent signs of distress.

## 2013-11-22 NOTE — ED Notes (Signed)
Notified Psychologist, prison and probation services, RN at [facility] that pt is returning with rx for UTI and needs to follow-up in 2 days

## 2013-11-22 NOTE — ED Notes (Signed)
Bed: ZO10 Expected date: 11/22/13 Expected time: 2:23 AM Means of arrival: Ambulance Comments: SNF sending pt, noone knows why

## 2013-11-22 NOTE — ED Notes (Signed)
Pt brought in by EMS, per report [facility] called stating pt had not had a BM in 3 days, and had a high blood pressure 157/107, pt was diaphoretic but on EMS arrival pt had a BM and was no longer diaphoretic.

## 2013-11-22 NOTE — ED Notes (Signed)
Called [facility] to transport pt back to [facility]

## 2013-11-22 NOTE — Discharge Instructions (Signed)
Urinary Tract Infection  Urinary tract infections (UTIs) can develop anywhere along your urinary tract. Your urinary tract is your body's drainage system for removing wastes and extra water. Your urinary tract includes two kidneys, two ureters, a bladder, and a urethra. Your kidneys are a pair of bean-shaped organs. Each kidney is about the size of your fist. They are located below your ribs, one on each side of your spine.  CAUSES  Infections are caused by microbes, which are microscopic organisms, including fungi, viruses, and bacteria. These organisms are so small that they can only be seen through a microscope. Bacteria are the microbes that most commonly cause UTIs.  SYMPTOMS   Symptoms of UTIs may vary by age and gender of the patient and by the location of the infection. Symptoms in young women typically include a frequent and intense urge to urinate and a painful, burning feeling in the bladder or urethra during urination. Older women and men are more likely to be tired, shaky, and weak and have muscle aches and abdominal pain. A fever may mean the infection is in your kidneys. Other symptoms of a kidney infection include pain in your back or sides below the ribs, nausea, and vomiting.  DIAGNOSIS  To diagnose a UTI, your caregiver will ask you about your symptoms. Your caregiver also will ask to provide a urine sample. The urine sample will be tested for bacteria and white blood cells. White blood cells are made by your body to help fight infection.  TREATMENT   Typically, UTIs can be treated with medication. Because most UTIs are caused by a bacterial infection, they usually can be treated with the use of antibiotics. The choice of antibiotic and length of treatment depend on your symptoms and the type of bacteria causing your infection.  HOME CARE INSTRUCTIONS   If you were prescribed antibiotics, take them exactly as your caregiver instructs you. Finish the medication even if you feel better after you  have only taken some of the medication.   Drink enough water and fluids to keep your urine clear or pale yellow.   Avoid caffeine, tea, and carbonated beverages. They tend to irritate your bladder.   Empty your bladder often. Avoid holding urine for long periods of time.   Empty your bladder before and after sexual intercourse.   After a bowel movement, women should cleanse from front to back. Use each tissue only once.  SEEK MEDICAL CARE IF:    You have back pain.   You develop a fever.   Your symptoms do not begin to resolve within 3 days.  SEEK IMMEDIATE MEDICAL CARE IF:    You have severe back pain or lower abdominal pain.   You develop chills.   You have nausea or vomiting.   You have continued burning or discomfort with urination.  MAKE SURE YOU:    Understand these instructions.   Will watch your condition.   Will get help right away if you are not doing well or get worse.  Document Released: 07/11/2005 Document Revised: 04/01/2012 Document Reviewed: 11/09/2011  [employer] Patient Information 2014 [employer]

## 2013-11-26 LAB — URINE CULTURE: Colony Count: 40000

## 2013-11-27 ENCOUNTER — Telehealth (HOSPITAL_COMMUNITY): Payer: Self-pay | Admitting: *Deleted

## 2013-11-27 NOTE — ED Notes (Signed)
Post ED Visit - Positive Culture Follow-up: Successful Patient Follow-Up  Culture assessed and recommendations reviewed by: []  Wes Dulaney, Pharm.D., BCPS []  Celedonio Miyamoto, Pharm.D., BCPS []  Georgina Pillion, [address].D., BCPS [x]  [place], [address].D., BCPS, AAHIVP []  Estella Husk, Pharm.D., BCPS, AAHIVP [X]  Treated with cefodoxime, organism resistant to prescribed antimicrobial  63 yo who was sent home on cefpodoxime for UTI. His UA looked dirty. Culture has grown out enterococcus. It would not be cover by current abx. Will start macrobid since his renal function should be adequate for this.  New antibiotic prescription:  Stop Cefpodoxime  Start Macrobid 100mg  PO BID x7 days  ED Provider: Sharrell Ku, PA    Larena Sox 11/27/2013, 4:13 PM

## 2013-11-27 NOTE — Progress Notes (Signed)
ED Antimicrobial Stewardship Positive Culture Follow Up   [name] is an 63 y.o. male who presented to [facility] on 11/22/2013 with a chief complaint of  Chief Complaint  Patient presents with  . Constipation  . Hypertension    Recent Results (from the past 720 hour(s))  URINE CULTURE     Status: None   Collection Time    11/22/13  6:48 AM      Result Value Ref Range Status   Specimen Description URINE, CATHETERIZED   Final   Special Requests NONE   Final   Culture  Setup Time     Final   Value: 11/22/2013 21:13     Performed at [facility]   Colony Count     Final   Value: 40,000 COLONIES/ML     Performed at [facility]   Culture     Final   Value: ENTEROCOCCUS SPECIES     Performed at [facility]   Report Status 11/26/2013 FINAL   Final   Organism ID, Bacteria ENTEROCOCCUS SPECIES   Final    [x]  Treated with cefodoxime, organism resistant to prescribed antimicrobial  63 yo who was sent home on cefpodoxime for UTI. His UA looked dirty. Culture has grown out enterococcus. It would not be cover by current abx. Will start macrobid since his renal function should be adequate for this.   New antibiotic prescription:   Stop Cefpodoxime Start Macrobid 100mg  PO BID x7 days   ED Provider: Sharrell Ku, PA   [name], PharmD Pager: [phone] Infectious Diseases Pharmacist Phone# [phone]

## 2014-07-27 ENCOUNTER — Emergency Department (HOSPITAL_COMMUNITY)
Admission: EM | Admit: 2014-07-27 | Discharge: 2014-07-27 | Disposition: A | Discharge status: Home / Self Care | Payer: Medicare Other | Attending: Emergency Medicine | Admitting: Emergency Medicine

## 2014-07-27 ENCOUNTER — Encounter (HOSPITAL_COMMUNITY): Payer: Self-pay | Admitting: Emergency Medicine

## 2014-07-27 DIAGNOSIS — G8111 Spastic hemiplegia affecting right dominant side: Secondary | ICD-10-CM | POA: Diagnosis not present

## 2014-07-27 DIAGNOSIS — I10 Essential (primary) hypertension: Secondary | ICD-10-CM | POA: Insufficient documentation

## 2014-07-27 DIAGNOSIS — Z86711 Personal history of pulmonary embolism: Secondary | ICD-10-CM | POA: Diagnosis not present

## 2014-07-27 DIAGNOSIS — N390 Urinary tract infection, site not specified: Secondary | ICD-10-CM | POA: Diagnosis present

## 2014-07-27 DIAGNOSIS — Z79899 Other long term (current) drug therapy: Secondary | ICD-10-CM | POA: Insufficient documentation

## 2014-07-27 DIAGNOSIS — Z791 Long term (current) use of non-steroidal anti-inflammatories (NSAID): Secondary | ICD-10-CM | POA: Insufficient documentation

## 2014-07-27 LAB — URINALYSIS, ROUTINE W REFLEX MICROSCOPIC
GLUCOSE, UA: NEGATIVE mg/dL
Ketones, ur: NEGATIVE mg/dL
Nitrite: POSITIVE — AB
PH: 6 (ref 5.0–8.0)
Protein, ur: >300 mg/dL — AB
Specific Gravity, Urine: 1.038 — ABNORMAL HIGH (ref 1.005–1.030)
Urobilinogen, UA: 1 mg/dL (ref 0.0–1.0)

## 2014-07-27 LAB — URINE MICROSCOPIC-ADD ON

## 2014-07-27 LAB — COMPREHENSIVE METABOLIC PANEL
ALT: 38 U/L (ref 0–53)
AST: 25 U/L (ref 0–37)
Albumin: 3.7 g/dL (ref 3.5–5.2)
Alkaline Phosphatase: 79 U/L (ref 39–117)
Anion gap: 12 (ref 5–15)
BILIRUBIN TOTAL: 0.4 mg/dL (ref 0.3–1.2)
BUN: 16 mg/dL (ref 6–23)
CALCIUM: 9.7 mg/dL (ref 8.4–10.5)
CHLORIDE: 107 meq/L (ref 96–112)
CO2: 22 meq/L (ref 19–32)
Creatinine, Ser: 0.8 mg/dL (ref 0.50–1.35)
GFR calc Af Amer: >90 mL/min (ref >90)
GFR calc non Af Amer: >90 mL/min (ref >90)
Glucose, Bld: 92 mg/dL (ref 70–99)
Potassium: 4.5 mEq/L (ref 3.7–5.3)
Sodium: 141 mEq/L (ref 137–147)
Total Protein: 8 g/dL (ref 6.0–8.3)

## 2014-07-27 LAB — CBC
HCT: 46.8 % (ref 39.0–52.0)
Hemoglobin: 15.7 g/dL (ref 13.0–17.0)
MCH: 28.9 pg (ref 26.0–34.0)
MCHC: 33.5 g/dL (ref 30.0–36.0)
MCV: 86.2 fL (ref 78.0–100.0)
PLATELETS: 172 10*3/uL (ref 150–400)
RBC: 5.43 MIL/uL (ref 4.22–5.81)
RDW: 16.3 % — AB (ref 11.5–15.5)
WBC: 8.2 10*3/uL (ref 4.0–10.5)

## 2014-07-27 MED ORDER — CEPHALEXIN 500 MG PO CAPS: 500.0000 mg | ORAL_CAPSULE | Freq: Four times a day (QID) | ORAL | Status: DC

## 2014-07-27 MED ORDER — CEFTRIAXONE SODIUM 1 G IJ SOLR
1.0000 g | Freq: Once | INTRAMUSCULAR | Status: AC
Start: 2014-07-27 — End: 2014-07-27
  Administered 2014-07-27: 1 g via INTRAMUSCULAR
  Filled 2014-07-27: qty 10

## 2014-07-27 MED ORDER — DEXTROSE 5 % IV SOLN
1.0000 g | Freq: Once | INTRAVENOUS | Status: DC
  Filled 2014-07-27: qty 10

## 2014-07-27 MED ORDER — LIDOCAINE HCL 1 % IJ SOLN
INTRAMUSCULAR | Status: AC
  Administered 2014-07-27: 2.1 mL
  Filled 2014-07-27: qty 20

## 2014-07-27 NOTE — ED Notes (Addendum)
Pickering MD and Tiburcio Pea PA at bedside inserting suprapubic catheter 16 [place] to replace current 42 [place] in place.

## 2014-07-27 NOTE — ED Notes (Signed)
Suprapublic catheter

## 2014-07-27 NOTE — ED Provider Notes (Signed)
CSN: [id number]     Arrival date & time 07/27/14  1011 History   First MD Initiated Contact with Patient 07/27/14 1137     Chief Complaint  Patient presents with  . Urinary Tract Infection     (Consider location/radiation/quality/duration/timing/severity/associated sxs/prior Treatment) HPI  [name] Is a 63 year old male with a history of multiple sclerosis, neurogenic bladder with chronic indwelling suprapubic catheter. He is a resident at the [facility]. Patient was sent over for evaluation for urinary tract infection as he was noted to have blood and turbidity of his urine. He denies any fevers, chills or other symptoms of systemic infection. He has no abdominal pain. Patient was unaware of changes to his urine. He states that his last catheter change was several weeks ago.  Past Medical History  Diagnosis Date  . MS (multiple sclerosis)     bedridden since 1986  . Neurogenic bladder     with suprapubic catheter  . Dysphagia   . Personal history of PE (pulmonary embolism)     on coumadin  . Hypertension    Past Surgical History  Procedure Laterality Date  . Tonsillectomy and adenoidectomy    . Suprapubic catheter placement      11/11   Family History  Problem Relation Age of Onset  . Heart disease Father    History  Substance Use Topics  . Smoking status: Never Smoker   . Smokeless tobacco: Never Used  . Alcohol Use: No    Review of Systems  Ten systems reviewed and are negative for acute change, except as noted in the HPI.     Allergies  Review of patient's allergies indicates no known allergies.  Home Medications   Prior to Admission medications   Medication Sig Start Date End Date Taking? Authorizing Provider  acetaminophen (TYLENOL) 325 MG tablet Take 650 mg by mouth 4 (four) times daily.   Yes Historical Provider, MD  acetaminophen (TYLENOL) 650 MG suppository Place 650 mg rectally every 6 (six) hours as needed for fever.   Yes Historical  Provider, MD  baclofen (LIORESAL) 10 MG tablet Take 10 mg by mouth 3 (three) times daily.   Yes Historical Provider, MD  calcium carbonate (TUMS - DOSED IN MG ELEMENTAL CALCIUM) 500 MG chewable tablet Chew 1 tablet by mouth daily.   Yes Historical Provider, MD  citalopram (CELEXA) 10 MG tablet Take 10 mg by mouth daily.    Yes Historical Provider, MD  Cranberry 425 MG CAPS Take 425 mg by mouth 2 (two) times daily.    Yes Historical Provider, MD  docusate (COLACE) 50 MG/5ML liquid Take 100 mg by mouth 2 (two) times daily.   Yes Historical Provider, MD  ipratropium-albuterol (DUONEB) 0.5-2.5 (3) MG/3ML SOLN Take 3 mLs by nebulization every 6 (six) hours as needed. Wheezing and sob   Yes Historical Provider, MD  meloxicam (MOBIC) 7.5 MG tablet Take 7.5 mg by mouth daily.   Yes Historical Provider, MD  metoprolol (LOPRESSOR) 50 MG tablet Take 50 mg by mouth 2 (two) times daily.   Yes Historical Provider, MD  Multiple Vitamin (MULITIVITAMIN WITH MINERALS) TABS Take 1 tablet by mouth daily.   Yes Historical Provider, MD  polyethylene glycol (MIRALAX / GLYCOLAX) packet Take 17 g by mouth daily.   Yes Historical Provider, MD  polyvinyl alcohol (LIQUIFILM TEARS) 1.4 % ophthalmic solution Place 2 drops into both eyes every 8 (eight) hours as needed for dry eyes.    Yes Historical Provider, MD  traMADol (ULTRAM) 50 MG tablet Take 50 mg by mouth every 6 (six) hours as needed for pain.   Yes Historical Provider, MD   BP 127/80  Pulse 71  Temp(Src) 98.1 F (36.7 C) (Oral)  Resp 18  SpO2 99% Physical Exam  Nursing note and vitals reviewed. Constitutional: He is oriented to person, place, and time. He appears well-developed and well-nourished. No distress.  HENT:  Head: Normocephalic and atraumatic.  Eyes: Conjunctivae are normal. No scleral icterus.  Disconjugate gaze  Neck: Normal range of motion. Neck supple.  Cardiovascular: Normal rate, regular rhythm and normal heart sounds.   Pulmonary/Chest:  Effort normal and breath sounds normal. No respiratory distress.  Abdominal: Soft. There is no tenderness.  Suprapubic catheter in place. No signs of erythema or infection from catheter site. No tenderness to palpation of the abdomen  Musculoskeletal: He exhibits no edema.  Neurological: He is alert and oriented to person, place, and time.  Bilateral upper extremity hemi-spasticity. Bilateral hemiparesis of the lower extremities.  Skin: Skin is warm and dry. He is not diaphoretic.  Psychiatric: His behavior is normal.     ED Course  SUPRAPUBIC CATHETER PLACEMENT Date/Time: 07/27/2014 12:29 PM Performed by: Arthor CaptainHARRIS, Holle Sprick Authorized by: Arthor CaptainHARRIS, Yolande Skoda Consent: Verbal consent obtained. Risks and benefits: risks, benefits and alternatives were discussed Patient identity confirmed: verbally with patient Indications: bladder incontinence, catheter change, inability to ambulate and neurogenic bladder Local anesthesia used: no Preparation: Patient was prepped and draped in the usual sterile fashion. Suprapubic aspiration by: catheter Catheter type: Foley Catheter size: 16 Fr Number of attempts: 1 Urine characteristics: bloody and cloudy Patient tolerance: Patient tolerated the procedure well with no immediate complications.   (including critical care time) Labs Review Labs Reviewed  CBC - Abnormal; Notable for the following:    RDW 16.3 (*)    All other components within normal limits  URINALYSIS, ROUTINE W REFLEX MICROSCOPIC - Abnormal; Notable for the following:    Color, Urine AMBER (*)    APPearance TURBID (*)    Specific Gravity, Urine 1.038 (*)    Hgb urine dipstick LARGE (*)    Bilirubin Urine MODERATE (*)    Protein, ur >300 (*)    Nitrite POSITIVE (*)    Leukocytes, UA MODERATE (*)    All other components within normal limits  URINE MICROSCOPIC-ADD ON - Abnormal; Notable for the following:    Squamous Epithelial / LPF FEW (*)    Bacteria, UA FEW (*)    All other  components within normal limits  COMPREHENSIVE METABOLIC PANEL    Imaging Review No results found.   EKG Interpretation None      MDM   Final diagnoses:  UTI (lower urinary tract infection)    Patient here with an infected urine, chronic suprapubic Foley catheter. Catheter was changed sterilely. Patient received 1 g of IM Rocephin. Discharge with Keflex. Urine sent for culture. D/c with keflex. F/u wit PCP. No signs or pyelo   Arthor Captainbigail Sayra Frisby, PA-C 07/28/14 1259

## 2014-07-27 NOTE — ED Notes (Signed)
Bed: WA19 Expected date:  Expected time:  Means of arrival:  Comments: ems 

## 2014-07-27 NOTE — ED Notes (Addendum)
Per [facility], pt is from [facility], pt hx of MS, has foley catheter. Staff dark urine with foul smell. Denies pain. According to staff pt has eaten and taken all of his morning meds.

## 2014-07-27 NOTE — ED Notes (Signed)
Pt states he was sent to the hospital to find out why he had blood in his urine bag; states he is in no distress

## 2014-07-27 NOTE — Discharge Instructions (Signed)

## 2014-07-28 NOTE — ED Provider Notes (Signed)
Medical screening examination/treatment/procedure(s) were conducted as a shared visit with non-physician practitioner(s) and myself.  I personally evaluated the patient during the encounter.   EKG Interpretation None     Patient with suprapubic catheter with infection. Changed. Will discharge antibiotics and culture  Timothy RudeNathan R. Rubin PayorPickering, MD 07/28/14 [phone]

## 2014-07-30 LAB — URINE CULTURE: Colony Count: >=100000

## 2014-08-02 ENCOUNTER — Telehealth (HOSPITAL_BASED_OUTPATIENT_CLINIC_OR_DEPARTMENT_OTHER): Payer: Self-pay

## 2014-08-02 NOTE — Telephone Encounter (Signed)
Post ED Visit - Positive Culture Follow-up  Culture report reviewed by antimicrobial stewardship pharmacist: []  Wes Dulaney, Pharm.D., BCPS [x]  Celedonio Miyamoto, Pharm.D., BCPS []  Georgina Pillion, Pharm.D., BCPS []  [place], [address].D., BCPS, AAHIVP []  Estella Husk, Pharm.D., BCPS, AAHIVP []  Carly Sabat, Pharm.D. []  Enzo Bi, [address].D.  Positive Urine culture Treated with Cephalexin, organism sensitive to the same and no further patient follow-up is required at this time.  Arvid Right 08/02/2014, 4:12 AM

## 2014-11-19 ENCOUNTER — Emergency Department (HOSPITAL_COMMUNITY)
Admission: EM | Admit: 2014-11-19 | Discharge: 2014-11-19 | Disposition: A | Discharge status: Home / Self Care | Payer: Medicare Other | Attending: Emergency Medicine | Admitting: Emergency Medicine

## 2014-11-19 DIAGNOSIS — Z791 Long term (current) use of non-steroidal anti-inflammatories (NSAID): Secondary | ICD-10-CM | POA: Diagnosis not present

## 2014-11-19 DIAGNOSIS — I10 Essential (primary) hypertension: Secondary | ICD-10-CM | POA: Insufficient documentation

## 2014-11-19 DIAGNOSIS — R509 Fever, unspecified: Secondary | ICD-10-CM | POA: Diagnosis present

## 2014-11-19 DIAGNOSIS — N390 Urinary tract infection, site not specified: Secondary | ICD-10-CM | POA: Insufficient documentation

## 2014-11-19 DIAGNOSIS — G35 Multiple sclerosis: Secondary | ICD-10-CM | POA: Diagnosis not present

## 2014-11-19 DIAGNOSIS — Z7901 Long term (current) use of anticoagulants: Secondary | ICD-10-CM | POA: Diagnosis not present

## 2014-11-19 DIAGNOSIS — Z86711 Personal history of pulmonary embolism: Secondary | ICD-10-CM | POA: Insufficient documentation

## 2014-11-19 DIAGNOSIS — Z79899 Other long term (current) drug therapy: Secondary | ICD-10-CM | POA: Insufficient documentation

## 2014-11-19 LAB — URINE MICROSCOPIC-ADD ON

## 2014-11-19 LAB — BASIC METABOLIC PANEL
Anion gap: 10 (ref 5–15)
BUN: 20 mg/dL (ref 6–23)
CHLORIDE: 107 mmol/L (ref 96–112)
CO2: 22 mmol/L (ref 19–32)
Calcium: 9.2 mg/dL (ref 8.4–10.5)
Creatinine, Ser: 1.08 mg/dL (ref 0.50–1.35)
GFR calc Af Amer: 84 mL/min — ABNORMAL LOW (ref >90)
GFR calc non Af Amer: 72 mL/min — ABNORMAL LOW (ref >90)
Glucose, Bld: 98 mg/dL (ref 70–99)
POTASSIUM: 4 mmol/L (ref 3.5–5.1)
Sodium: 139 mmol/L (ref 135–145)

## 2014-11-19 LAB — CBC WITH DIFFERENTIAL/PLATELET
Basophils Absolute: 0 10*3/uL (ref 0.0–0.1)
Basophils Relative: 0 % (ref 0–1)
EOS PCT: 0 % (ref 0–5)
Eosinophils Absolute: 0 10*3/uL (ref 0.0–0.7)
HCT: 49.4 % (ref 39.0–52.0)
Hemoglobin: 16.4 g/dL (ref 13.0–17.0)
LYMPHS ABS: 1.4 10*3/uL (ref 0.7–4.0)
Lymphocytes Relative: 10 % — ABNORMAL LOW (ref 12–46)
MCH: 29.9 pg (ref 26.0–34.0)
MCHC: 33.2 g/dL (ref 30.0–36.0)
MCV: 90 fL (ref 78.0–100.0)
Monocytes Absolute: 1.6 10*3/uL — ABNORMAL HIGH (ref 0.1–1.0)
Monocytes Relative: 11 % (ref 3–12)
Neutro Abs: 11.3 10*3/uL — ABNORMAL HIGH (ref 1.7–7.7)
Neutrophils Relative %: 79 % — ABNORMAL HIGH (ref 43–77)
PLATELETS: 156 10*3/uL (ref 150–400)
RBC: 5.49 MIL/uL (ref 4.22–5.81)
RDW: 15.6 % — AB (ref 11.5–15.5)
WBC: 14.3 10*3/uL — ABNORMAL HIGH (ref 4.0–10.5)

## 2014-11-19 LAB — URINALYSIS, ROUTINE W REFLEX MICROSCOPIC
GLUCOSE, UA: NEGATIVE mg/dL
KETONES UR: 15 mg/dL — AB
Nitrite: POSITIVE — AB
PROTEIN: 30 mg/dL — AB
Specific Gravity, Urine: >1.046 — ABNORMAL HIGH (ref 1.005–1.030)
UROBILINOGEN UA: 1 mg/dL (ref 0.0–1.0)
pH: 5 (ref 5.0–8.0)

## 2014-11-19 MED ORDER — ACETAMINOPHEN 325 MG PO TABS
650.0000 mg | ORAL_TABLET | Freq: Once | ORAL | Status: AC
  Administered 2014-11-19: 650 mg via ORAL
  Filled 2014-11-19: qty 2

## 2014-11-19 MED ORDER — SULFAMETHOXAZOLE-TRIMETHOPRIM 800-160 MG PO TABS: 1.0000 | ORAL_TABLET | Freq: Two times a day (BID) | ORAL | Status: DC

## 2014-11-19 NOTE — ED Notes (Signed)
Bed: [facility] Expected date:  Expected time:  Means of arrival:  Comments: EMS-blood in cath

## 2014-11-19 NOTE — Discharge Instructions (Signed)
Take Bactrim as directed until gone. Refer to attached documents for more information. Return to the ED with worsening or concerning symptoms.  °

## 2014-11-19 NOTE — ED Notes (Signed)
MD at bedside. 

## 2014-11-19 NOTE — ED Provider Notes (Signed)
CSN: [id number]     Arrival date & time 11/19/14  1329 History   First MD Initiated Contact with Patient 11/19/14 1520     Chief Complaint  Patient presents with  . Fever  . Hematuria     (Consider location/radiation/quality/duration/timing/severity/associated sxs/prior Treatment) HPI Comments: Patient is a 64 year old male with a past medical history of MS, neurogenic bladder with indwelling catheter, and dysphagia who presents with a fever that started last night per staff. Patient was sent to the ED via EMS from [facility]. Patient's max temp has been 102F. Patient was not given any medication for the fever per patient. Patient also reports having blood in his urine. He has a history of frequent UTI. No abdominal pain, fatigue, loss of appetite, or other associated symptoms. No aggravating/alleviating factors.    Past Medical History  Diagnosis Date  . MS (multiple sclerosis)     bedridden since 1986  . Neurogenic bladder     with suprapubic catheter  . Dysphagia   . Personal history of PE (pulmonary embolism)     on coumadin  . Hypertension    Past Surgical History  Procedure Laterality Date  . Tonsillectomy and adenoidectomy    . Suprapubic catheter placement      11/11   Family History  Problem Relation Age of Onset  . Heart disease Father    History  Substance Use Topics  . Smoking status: Never Smoker   . Smokeless tobacco: Never Used  . Alcohol Use: No    Review of Systems  Constitutional: Positive for fever. Negative for chills and fatigue.  HENT: Negative for trouble swallowing.   Eyes: Negative for visual disturbance.  Respiratory: Negative for shortness of breath.   Cardiovascular: Negative for chest pain and palpitations.  Gastrointestinal: Negative for nausea, vomiting, abdominal pain and diarrhea.  Genitourinary: Positive for hematuria. Negative for dysuria and difficulty urinating.  Musculoskeletal: Negative for arthralgias  and neck pain.  Skin: Negative for color change.  Neurological: Negative for dizziness and weakness.  Psychiatric/Behavioral: Negative for dysphoric mood.  All other systems reviewed and are negative.     Allergies  Review of patient's allergies indicates no known allergies.  Home Medications   Prior to Admission medications   Medication Sig Start Date End Date Taking? Authorizing Provider  acetaminophen (TYLENOL) 325 MG tablet Take 650 mg by mouth 4 (four) times daily.   Yes Historical Provider, MD  acetaminophen (TYLENOL) 650 MG suppository Place 650 mg rectally every 6 (six) hours as needed for fever.   Yes Historical Provider, MD  baclofen (LIORESAL) 10 MG tablet Take 10 mg by mouth 3 (three) times daily.   Yes Historical Provider, MD  calcium carbonate (TUMS - DOSED IN MG ELEMENTAL CALCIUM) 500 MG chewable tablet Chew 1 tablet by mouth daily.   Yes Historical Provider, MD  citalopram (CELEXA) 10 MG tablet Take 10 mg by mouth daily.    Yes Historical Provider, MD  Cranberry 425 MG CAPS Take 425 mg by mouth 2 (two) times daily.    Yes Historical Provider, MD  docusate (COLACE) 50 MG/5ML liquid Take 100 mg by mouth 2 (two) times daily.   Yes Historical Provider, MD  guaiFENesin (MUCINEX) 600 MG 12 hr tablet Take 1,200 mg by mouth every 12 (twelve) hours.   Yes Historical Provider, MD  ipratropium-albuterol (DUONEB) 0.5-2.5 (3) MG/3ML SOLN Take 3 mLs by nebulization every 6 (six) hours as needed. Wheezing and sob   Yes  Historical Provider, MD  meloxicam (MOBIC) 7.5 MG tablet Take 7.5 mg by mouth daily.   Yes Historical Provider, MD  metoprolol (LOPRESSOR) 50 MG tablet Take 50 mg by mouth 2 (two) times daily. Along with Metoprolol tartrate  for a total of =  bid   Yes Historical Provider, MD  metoprolol succinate (TOPROL-XL) 12.5 mg TB24 24 hr tablet Take 12.5 mg by mouth 2 (two) times daily. Along with Metoprolol tartrate  for a total of =  bid   Yes Historical Provider,  MD  Multiple Vitamin (MULITIVITAMIN WITH MINERALS) TABS Take 1 tablet by mouth daily.   Yes Historical Provider, MD  polyethylene glycol (MIRALAX / GLYCOLAX) packet Take 17 g by mouth daily.   Yes Historical Provider, MD  polyvinyl alcohol (LIQUIFILM TEARS) 1.4 % ophthalmic solution Place 2 drops into both eyes every 8 (eight) hours as needed for dry eyes.    Yes Historical Provider, MD  traMADol (ULTRAM) 50 MG tablet Take 50 mg by mouth every 6 (six) hours as needed for pain.   Yes Historical Provider, MD  cephALEXin (KEFLEX) 500 MG capsule Take 1 capsule (500 mg total) by mouth 4 (four) times daily. Patient not taking: Reported on 11/19/2014 07/27/14   Arthor Captain, PA-C   BP 118/66 mmHg  Pulse 88  Temp(Src) 100.7 F (38.2 C) (Rectal)  Resp 18  SpO2 95% Physical Exam  Constitutional: He appears well-developed and well-nourished. No distress.  HENT:  Head: Normocephalic and atraumatic.  Eyes: Conjunctivae and EOM are normal.  Neck: Normal range of motion.  Cardiovascular: Normal rate and regular rhythm.  Exam reveals no gallop and no friction rub.   No murmur heard. Pulmonary/Chest: Effort normal and breath sounds normal. He has no wheezes. He has no rales. He exhibits no tenderness.  Abdominal: Soft. He exhibits no distension. There is no tenderness. There is no rebound.  Musculoskeletal: Normal range of motion.  Neurological: He is alert.  Arms contracted. Abnormal coordination due to MS. Speech is slow, which is baseline for him.   Skin: Skin is warm and dry.  Psychiatric: He has a normal mood and affect. His behavior is normal.  Nursing note and vitals reviewed.   ED Course  Procedures (including critical care time) Labs Review Labs Reviewed  CBC WITH DIFFERENTIAL/PLATELET - Abnormal; Notable for the following:    WBC 14.3 (*)    RDW 15.6 (*)    Neutrophils Relative % 79 (*)    Neutro Abs 11.3 (*)    Lymphocytes Relative 10 (*)    Monocytes Absolute 1.6 (*)    All  other components within normal limits  BASIC METABOLIC PANEL - Abnormal; Notable for the following:    GFR calc non Af Amer 72 (*)    GFR calc Af Amer 84 (*)    All other components within normal limits  URINALYSIS, ROUTINE W REFLEX MICROSCOPIC - Abnormal; Notable for the following:    Color, Urine ORANGE (*)    APPearance CLOUDY (*)    Specific Gravity, Urine >1.046 (*)    Hgb urine dipstick LARGE (*)    Bilirubin Urine MODERATE (*)    Ketones, ur 15 (*)    Protein, ur 30 (*)    Nitrite POSITIVE (*)    Leukocytes, UA MODERATE (*)    All other components within normal limits  URINE MICROSCOPIC-ADD ON - Abnormal; Notable for the following:    Bacteria, UA MANY (*)    All other components within normal limits  URINE CULTURE    Imaging Review No results found.   EKG Interpretation None      MDM   Final diagnoses:  UTI (lower urinary tract infection)   3:21 PM Labs and urinalysis pending. Patient is febrile at 100.16F at this time. Patient will have tylenol for fever. Patient stable at this time.   5:51 PM Patient's labs show elevated WBC due to UTI. Urinalysis shows infection. Creatinine stable. Patient will be treated with Bactrim based on previous urine culture. Urine will be sent for culture. Patient instructed to return with worsening or concerning symptoms.   Emilia Beck, PA-C 11/19/14 1752  Flint Melter, MD 11/20/14 [phone]

## 2014-11-19 NOTE — ED Notes (Signed)
Per EMS, pt from [facility].  Pt has fever of 102 last night.  Pt has chronic indwelling cath.  Has not had urine output since yesterday.  The urine output prior was bloody.  Hx of UTI.  Baseline mental status.  Pt slow to answer but alert and oriented x 4. Hx pt has MS.  Arms contracted.  Vitals: 122 palp, hr 86, 98%, 99 temp, cbg 113.  No pain.

## 2014-11-19 NOTE — ED Provider Notes (Signed)
  Face-to-face evaluation   History: The patient was sent here for evaluation of hematuria and fever.  He apparently had blood in his urine, which was in his bag connected to his suprapubic catheter.  He is unable to give additional history.  Physical exam: Alert, calm, elderly patient.  Abdomen is distended and nontender.  His suprapubic catheter present.  The site and catheter appear normal.  There is yellow urine in the catheter bag.  Penis is remarkable for blood at the urethral meatus.  There are no traumatic injuries.  Scrotum and testicles are normal.  Medical screening examination/treatment/procedure(s) were conducted as a shared visit with non-physician practitioner(s) and myself.  I personally evaluated the patient during the encounter  Flint Melter, MD 11/20/14 1232

## 2014-11-22 LAB — URINE CULTURE
Colony Count: 30000
SPECIAL REQUESTS: NORMAL

## 2014-11-23 ENCOUNTER — Telehealth (HOSPITAL_BASED_OUTPATIENT_CLINIC_OR_DEPARTMENT_OTHER): Payer: Self-pay | Admitting: Emergency Medicine

## 2014-11-23 NOTE — Telephone Encounter (Signed)
Post ED Visit - Positive Culture Follow-up  Culture report reviewed by antimicrobial stewardship pharmacist:  Wes Dulaney, Pharm.D., BCPS  Celedonio Miyamoto, [address].D., BCPS  Georgina Pillion, Pharm.D., BCPS  [place], [address].D., BCPS, AAHIVP  Estella Husk, Pharm.D., BCPS, AAHIVP  Elder Cyphers, [address].D., BCPS  Positive urine culture Proteus Mirabilis Treated with Septra DS one every 12 hours x 10 days, organism sensitive to the same and no further patient follow-up is required at this time.  Berle Mull 11/23/2014, 10:25 AM

## 2015-02-16 ENCOUNTER — Emergency Department (HOSPITAL_COMMUNITY): Payer: Medicare Other

## 2015-02-16 ENCOUNTER — Encounter (HOSPITAL_COMMUNITY): Payer: Self-pay | Admitting: Nurse Practitioner

## 2015-02-16 ENCOUNTER — Inpatient Hospital Stay (HOSPITAL_COMMUNITY)
Admission: EM | Admit: 2015-02-16 | Discharge: 2015-02-21 | DRG: 871 | Disposition: A | Discharge status: Skilled Nursing Facility | Payer: Medicare Other | Attending: Internal Medicine | Admitting: Internal Medicine

## 2015-02-16 DIAGNOSIS — Z79891 Long term (current) use of opiate analgesic: Secondary | ICD-10-CM

## 2015-02-16 DIAGNOSIS — R509 Fever, unspecified: Secondary | ICD-10-CM | POA: Diagnosis not present

## 2015-02-16 DIAGNOSIS — R7881 Bacteremia: Secondary | ICD-10-CM | POA: Diagnosis present

## 2015-02-16 DIAGNOSIS — Z86711 Personal history of pulmonary embolism: Secondary | ICD-10-CM

## 2015-02-16 DIAGNOSIS — T83511A Infection and inflammatory reaction due to indwelling urethral catheter, initial encounter: Secondary | ICD-10-CM

## 2015-02-16 DIAGNOSIS — N390 Urinary tract infection, site not specified: Secondary | ICD-10-CM

## 2015-02-16 DIAGNOSIS — G35 Multiple sclerosis: Secondary | ICD-10-CM | POA: Diagnosis present

## 2015-02-16 DIAGNOSIS — L89109 Pressure ulcer of unspecified part of back, unspecified stage: Secondary | ICD-10-CM | POA: Diagnosis present

## 2015-02-16 DIAGNOSIS — Z8249 Family history of ischemic heart disease and other diseases of the circulatory system: Secondary | ICD-10-CM

## 2015-02-16 DIAGNOSIS — Z936 Other artificial openings of urinary tract status: Secondary | ICD-10-CM

## 2015-02-16 DIAGNOSIS — A419 Sepsis, unspecified organism: Secondary | ICD-10-CM | POA: Diagnosis present

## 2015-02-16 DIAGNOSIS — R1310 Dysphagia, unspecified: Secondary | ICD-10-CM | POA: Diagnosis present

## 2015-02-16 DIAGNOSIS — Z8701 Personal history of pneumonia (recurrent): Secondary | ICD-10-CM

## 2015-02-16 DIAGNOSIS — T8351XA Infection and inflammatory reaction due to indwelling urinary catheter, initial encounter: Secondary | ICD-10-CM | POA: Diagnosis present

## 2015-02-16 DIAGNOSIS — Y846 Urinary catheterization as the cause of abnormal reaction of the patient, or of later complication, without mention of misadventure at the time of the procedure: Secondary | ICD-10-CM | POA: Diagnosis present

## 2015-02-16 DIAGNOSIS — A411 Sepsis due to other specified staphylococcus: Principal | ICD-10-CM | POA: Diagnosis present

## 2015-02-16 DIAGNOSIS — Z792 Long term (current) use of antibiotics: Secondary | ICD-10-CM

## 2015-02-16 DIAGNOSIS — G8250 Quadriplegia, unspecified: Secondary | ICD-10-CM | POA: Diagnosis present

## 2015-02-16 DIAGNOSIS — Z87440 Personal history of urinary (tract) infections: Secondary | ICD-10-CM

## 2015-02-16 DIAGNOSIS — N319 Neuromuscular dysfunction of bladder, unspecified: Secondary | ICD-10-CM | POA: Diagnosis present

## 2015-02-16 DIAGNOSIS — L89153 Pressure ulcer of sacral region, stage 3: Secondary | ICD-10-CM | POA: Diagnosis present

## 2015-02-16 DIAGNOSIS — Z79899 Other long term (current) drug therapy: Secondary | ICD-10-CM

## 2015-02-16 DIAGNOSIS — K4090 Unilateral inguinal hernia, without obstruction or gangrene, not specified as recurrent: Secondary | ICD-10-CM | POA: Diagnosis present

## 2015-02-16 DIAGNOSIS — K5900 Constipation, unspecified: Secondary | ICD-10-CM | POA: Diagnosis present

## 2015-02-16 DIAGNOSIS — B9620 Unspecified Escherichia coli [E. coli] as the cause of diseases classified elsewhere: Secondary | ICD-10-CM | POA: Diagnosis present

## 2015-02-16 DIAGNOSIS — I10 Essential (primary) hypertension: Secondary | ICD-10-CM | POA: Diagnosis present

## 2015-02-16 DIAGNOSIS — F329 Major depressive disorder, single episode, unspecified: Secondary | ICD-10-CM | POA: Diagnosis present

## 2015-02-16 HISTORY — DX: Quadriplegia, unspecified: G82.50

## 2015-02-16 LAB — CBC
HCT: 44.3 % (ref 39.0–52.0)
Hemoglobin: 14.7 g/dL (ref 13.0–17.0)
MCH: 29.6 pg (ref 26.0–34.0)
MCHC: 33.2 g/dL (ref 30.0–36.0)
MCV: 89.3 fL (ref 78.0–100.0)
PLATELETS: 194 10*3/uL (ref 150–400)
RBC: 4.96 MIL/uL (ref 4.22–5.81)
RDW: 15.8 % — ABNORMAL HIGH (ref 11.5–15.5)
WBC: 13.8 10*3/uL — AB (ref 4.0–10.5)

## 2015-02-16 LAB — I-STAT TROPONIN, ED: TROPONIN I, POC: 0 ng/mL (ref 0.00–0.08)

## 2015-02-16 LAB — I-STAT CG4 LACTIC ACID, ED: Lactic Acid, Venous: 1.78 mmol/L (ref 0.5–2.0)

## 2015-02-16 LAB — BRAIN NATRIURETIC PEPTIDE: B NATRIURETIC PEPTIDE 5: 35.7 pg/mL (ref 0.0–100.0)

## 2015-02-16 MED ORDER — SODIUM CHLORIDE 0.9 % IV BOLUS (SEPSIS)
1000.0000 mL | Freq: Once | INTRAVENOUS | Status: AC
  Administered 2015-02-16: 1000 mL via INTRAVENOUS

## 2015-02-16 NOTE — ED Notes (Signed)
Bed: [facility]08 Expected date:  Expected time:  Means of arrival:  Comments: EMS 64 yo male resp distress/possible code sepsis protocol/ST 120/hx ALS/foley 126/70 99% /hx PNA 1 week ago-finished today

## 2015-02-16 NOTE — ED Notes (Addendum)
Per EMS, patient from [facility], recent dx of pneumonia, finished abx today at facility. Patient abd distended and c/o pain with touch. Patient lungs rhonchi, able to minimally clear with slight cough. Patient febrile at facility. Patient urine burgundy in catheter bag.

## 2015-02-16 NOTE — ED Notes (Signed)
MD at bedside to change supra-pubic cath

## 2015-02-16 NOTE — ED Notes (Signed)
EKG given to EDP,Weintz,MD., for  Review.

## 2015-02-16 NOTE — ED Notes (Signed)
Lab at bedside to recollect BNP

## 2015-02-16 NOTE — ED Notes (Signed)
Catheter requested from portable equipment

## 2015-02-17 ENCOUNTER — Encounter (HOSPITAL_COMMUNITY): Payer: Self-pay | Admitting: Radiology

## 2015-02-17 ENCOUNTER — Emergency Department (HOSPITAL_COMMUNITY): Payer: Medicare Other

## 2015-02-17 DIAGNOSIS — A4151 Sepsis due to Escherichia coli [E. coli]: Secondary | ICD-10-CM | POA: Diagnosis not present

## 2015-02-17 DIAGNOSIS — G8250 Quadriplegia, unspecified: Secondary | ICD-10-CM | POA: Diagnosis present

## 2015-02-17 DIAGNOSIS — Z8249 Family history of ischemic heart disease and other diseases of the circulatory system: Secondary | ICD-10-CM | POA: Diagnosis not present

## 2015-02-17 DIAGNOSIS — K5900 Constipation, unspecified: Secondary | ICD-10-CM | POA: Diagnosis not present

## 2015-02-17 DIAGNOSIS — L89153 Pressure ulcer of sacral region, stage 3: Secondary | ICD-10-CM | POA: Diagnosis present

## 2015-02-17 DIAGNOSIS — Z86711 Personal history of pulmonary embolism: Secondary | ICD-10-CM | POA: Diagnosis not present

## 2015-02-17 DIAGNOSIS — T8351XA Infection and inflammatory reaction due to indwelling urinary catheter, initial encounter: Secondary | ICD-10-CM

## 2015-02-17 DIAGNOSIS — N319 Neuromuscular dysfunction of bladder, unspecified: Secondary | ICD-10-CM | POA: Diagnosis present

## 2015-02-17 DIAGNOSIS — R509 Fever, unspecified: Secondary | ICD-10-CM | POA: Diagnosis present

## 2015-02-17 DIAGNOSIS — Z87440 Personal history of urinary (tract) infections: Secondary | ICD-10-CM | POA: Diagnosis not present

## 2015-02-17 DIAGNOSIS — I10 Essential (primary) hypertension: Secondary | ICD-10-CM

## 2015-02-17 DIAGNOSIS — A499 Bacterial infection, unspecified: Secondary | ICD-10-CM | POA: Diagnosis not present

## 2015-02-17 DIAGNOSIS — N390 Urinary tract infection, site not specified: Secondary | ICD-10-CM | POA: Diagnosis not present

## 2015-02-17 DIAGNOSIS — Y846 Urinary catheterization as the cause of abnormal reaction of the patient, or of later complication, without mention of misadventure at the time of the procedure: Secondary | ICD-10-CM | POA: Diagnosis present

## 2015-02-17 DIAGNOSIS — G35 Multiple sclerosis: Secondary | ICD-10-CM

## 2015-02-17 DIAGNOSIS — Z79891 Long term (current) use of opiate analgesic: Secondary | ICD-10-CM | POA: Diagnosis not present

## 2015-02-17 DIAGNOSIS — A411 Sepsis due to other specified staphylococcus: Secondary | ICD-10-CM | POA: Diagnosis present

## 2015-02-17 DIAGNOSIS — K4090 Unilateral inguinal hernia, without obstruction or gangrene, not specified as recurrent: Secondary | ICD-10-CM | POA: Diagnosis present

## 2015-02-17 DIAGNOSIS — Z79899 Other long term (current) drug therapy: Secondary | ICD-10-CM | POA: Diagnosis not present

## 2015-02-17 DIAGNOSIS — A419 Sepsis, unspecified organism: Secondary | ICD-10-CM | POA: Diagnosis not present

## 2015-02-17 DIAGNOSIS — Z792 Long term (current) use of antibiotics: Secondary | ICD-10-CM | POA: Diagnosis not present

## 2015-02-17 DIAGNOSIS — Z8701 Personal history of pneumonia (recurrent): Secondary | ICD-10-CM | POA: Diagnosis not present

## 2015-02-17 DIAGNOSIS — Z936 Other artificial openings of urinary tract status: Secondary | ICD-10-CM | POA: Diagnosis not present

## 2015-02-17 DIAGNOSIS — R1310 Dysphagia, unspecified: Secondary | ICD-10-CM | POA: Diagnosis present

## 2015-02-17 DIAGNOSIS — F329 Major depressive disorder, single episode, unspecified: Secondary | ICD-10-CM | POA: Diagnosis present

## 2015-02-17 DIAGNOSIS — B9620 Unspecified Escherichia coli [E. coli] as the cause of diseases classified elsewhere: Secondary | ICD-10-CM | POA: Diagnosis present

## 2015-02-17 LAB — URINALYSIS, ROUTINE W REFLEX MICROSCOPIC
GLUCOSE, UA: NEGATIVE mg/dL
Ketones, ur: NEGATIVE mg/dL
Nitrite: NEGATIVE
PROTEIN: 30 mg/dL — AB
Specific Gravity, Urine: >1.046 — ABNORMAL HIGH (ref 1.005–1.030)
UROBILINOGEN UA: 1 mg/dL (ref 0.0–1.0)
pH: 6 (ref 5.0–8.0)

## 2015-02-17 LAB — CBC
HEMATOCRIT: 38.2 % — AB (ref 39.0–52.0)
Hemoglobin: 12.3 g/dL — ABNORMAL LOW (ref 13.0–17.0)
MCH: 29.6 pg (ref 26.0–34.0)
MCHC: 32.2 g/dL (ref 30.0–36.0)
MCV: 91.8 fL (ref 78.0–100.0)
PLATELETS: 122 10*3/uL — AB (ref 150–400)
RBC: 4.16 MIL/uL — ABNORMAL LOW (ref 4.22–5.81)
RDW: 15.5 % (ref 11.5–15.5)
WBC: 12.8 10*3/uL — ABNORMAL HIGH (ref 4.0–10.5)

## 2015-02-17 LAB — HEPATIC FUNCTION PANEL
ALK PHOS: 60 U/L (ref 38–126)
ALT: 40 U/L (ref 17–63)
AST: 35 U/L (ref 15–41)
Albumin: 3.3 g/dL — ABNORMAL LOW (ref 3.5–5.0)
BILIRUBIN DIRECT: 0.1 mg/dL (ref 0.1–0.5)
Indirect Bilirubin: 0.5 mg/dL (ref 0.3–0.9)
Total Bilirubin: 0.6 mg/dL (ref 0.3–1.2)
Total Protein: 6.5 g/dL (ref 6.5–8.1)

## 2015-02-17 LAB — URINE MICROSCOPIC-ADD ON

## 2015-02-17 LAB — PROTIME-INR
INR: 1.08 (ref 0.00–1.49)
PROTHROMBIN TIME: 14.1 s (ref 11.6–15.2)

## 2015-02-17 LAB — MRSA PCR SCREENING: MRSA BY PCR: NEGATIVE

## 2015-02-17 LAB — BASIC METABOLIC PANEL
Anion gap: 4 — ABNORMAL LOW (ref 5–15)
Anion gap: 4 — ABNORMAL LOW (ref 5–15)
BUN: 15 mg/dL (ref 6–20)
BUN: 13 mg/dL (ref 6–20)
CALCIUM: 8.4 mg/dL — AB (ref 8.9–10.3)
CHLORIDE: 113 mmol/L — AB (ref 101–111)
CO2: 23 mmol/L (ref 22–32)
CO2: 22 mmol/L (ref 22–32)
CREATININE: 1.03 mg/dL (ref 0.61–1.24)
Calcium: 7.8 mg/dL — ABNORMAL LOW (ref 8.9–10.3)
Chloride: 112 mmol/L — ABNORMAL HIGH (ref 101–111)
Creatinine, Ser: 0.82 mg/dL (ref 0.61–1.24)
GFR calc Af Amer: >60 mL/min (ref >60)
GFR calc Af Amer: >60 mL/min (ref >60)
Glucose, Bld: 111 mg/dL — ABNORMAL HIGH (ref 70–99)
Glucose, Bld: 94 mg/dL (ref 70–99)
POTASSIUM: 4.2 mmol/L (ref 3.5–5.1)
Potassium: 4.1 mmol/L (ref 3.5–5.1)
SODIUM: 139 mmol/L (ref 135–145)
Sodium: 139 mmol/L (ref 135–145)

## 2015-02-17 LAB — APTT: APTT: 34 s (ref 24–37)

## 2015-02-17 LAB — LIPASE, BLOOD: Lipase: 18 U/L — ABNORMAL LOW (ref 22–51)

## 2015-02-17 LAB — GLUCOSE, CAPILLARY: Glucose-Capillary: 78 mg/dL (ref 70–99)

## 2015-02-17 LAB — LACTIC ACID, PLASMA: LACTIC ACID, VENOUS: 0.9 mmol/L (ref 0.5–2.0)

## 2015-02-17 LAB — I-STAT CG4 LACTIC ACID, ED: Lactic Acid, Venous: 1.63 mmol/L (ref 0.5–2.0)

## 2015-02-17 LAB — PROCALCITONIN: PROCALCITONIN: 28.05 ng/mL

## 2015-02-17 MED ORDER — POLYETHYLENE GLYCOL 3350 17 G PO PACK
17.0000 g | PACK | Freq: Every day | ORAL | Status: DC
  Administered 2015-02-17: 17 g via ORAL
  Filled 2015-02-17: qty 1

## 2015-02-17 MED ORDER — POLYVINYL ALCOHOL 1.4 % OP SOLN
1.0000 [drp] | Freq: Every day | OPHTHALMIC | Status: DC
  Administered 2015-02-17 – 2015-02-21 (×5): 1 [drp] via OPHTHALMIC
  Filled 2015-02-17 (×2): qty 15

## 2015-02-17 MED ORDER — IOHEXOL 300 MG/ML  SOLN
100.0000 mL | Freq: Once | INTRAMUSCULAR | Status: AC | PRN
  Administered 2015-02-17: 100 mL via INTRAVENOUS

## 2015-02-17 MED ORDER — CITALOPRAM HYDROBROMIDE 10 MG PO TABS
10.0000 mg | ORAL_TABLET | Freq: Every day | ORAL | Status: DC
  Administered 2015-02-18 – 2015-02-21 (×4): 10 mg via ORAL
  Filled 2015-02-17 (×6): qty 1

## 2015-02-17 MED ORDER — CETYLPYRIDINIUM CHLORIDE 0.05 % MT LIQD
7.0000 mL | Freq: Two times a day (BID) | OROMUCOSAL | Status: DC
  Administered 2015-02-17 – 2015-02-21 (×7): 7 mL via OROMUCOSAL

## 2015-02-17 MED ORDER — DEXTROSE 5 % IV SOLN
1.0000 g | Freq: Once | INTRAVENOUS | Status: AC
  Administered 2015-02-17: 1 g via INTRAVENOUS
  Filled 2015-02-17: qty 10

## 2015-02-17 MED ORDER — HEPARIN SODIUM (PORCINE) 5000 UNIT/ML IJ SOLN
5000.0000 [IU] | Freq: Three times a day (TID) | INTRAMUSCULAR | Status: DC
  Administered 2015-02-17 – 2015-02-21 (×14): 5000 [IU] via SUBCUTANEOUS
  Filled 2015-02-17 (×13): qty 1

## 2015-02-17 MED ORDER — SODIUM CHLORIDE 0.9 % IV BOLUS (SEPSIS)
1000.0000 mL | Freq: Once | INTRAVENOUS | Status: DC
  Administered 2015-02-17: 1000 mL via INTRAVENOUS

## 2015-02-17 MED ORDER — POLYETHYLENE GLYCOL 3350 17 G PO PACK
17.0000 g | PACK | Freq: Two times a day (BID) | ORAL | Status: DC
  Administered 2015-02-17 – 2015-02-21 (×8): 17 g via ORAL
  Filled 2015-02-17 (×8): qty 1

## 2015-02-17 MED ORDER — SODIUM CHLORIDE 0.9 % IJ SOLN
3.0000 mL | Freq: Two times a day (BID) | INTRAMUSCULAR | Status: DC
  Administered 2015-02-17 – 2015-02-21 (×4): 3 mL via INTRAVENOUS

## 2015-02-17 MED ORDER — ACETAMINOPHEN 325 MG PO TABS: 650.0000 mg | ORAL_TABLET | Freq: Four times a day (QID) | ORAL | Status: DC | PRN

## 2015-02-17 MED ORDER — SODIUM CHLORIDE 0.9 % IV SOLN
INTRAVENOUS | Status: AC
  Administered 2015-02-17: 19:00:00 via INTRAVENOUS
  Administered 2015-02-18: 75 mL/h via INTRAVENOUS
  Administered 2015-02-19: 05:00:00 via INTRAVENOUS

## 2015-02-17 MED ORDER — DOCUSATE SODIUM 100 MG PO CAPS
100.0000 mg | ORAL_CAPSULE | Freq: Two times a day (BID) | ORAL | Status: DC
  Administered 2015-02-17 – 2015-02-21 (×9): 100 mg via ORAL
  Filled 2015-02-17 (×10): qty 1

## 2015-02-17 MED ORDER — ONDANSETRON HCL 4 MG/2ML IJ SOLN: 4.0000 mg | Freq: Four times a day (QID) | INTRAMUSCULAR | Status: DC | PRN

## 2015-02-17 MED ORDER — CEFTRIAXONE SODIUM IN DEXTROSE 20 MG/ML IV SOLN
1.0000 g | INTRAVENOUS | Status: DC
  Administered 2015-02-18 – 2015-02-20 (×3): 1 g via INTRAVENOUS
  Filled 2015-02-17 (×4): qty 50

## 2015-02-17 MED ORDER — ONDANSETRON HCL 4 MG PO TABS: 4.0000 mg | ORAL_TABLET | Freq: Four times a day (QID) | ORAL | Status: DC | PRN

## 2015-02-17 MED ORDER — SODIUM CHLORIDE 0.9 % IV SOLN
Freq: Once | INTRAVENOUS | Status: AC
  Administered 2015-02-17: 06:00:00 via INTRAVENOUS

## 2015-02-17 MED ORDER — SODIUM CHLORIDE 0.9 % IV SOLN
Freq: Once | INTRAVENOUS | Status: AC
  Administered 2015-02-17: 01:00:00 via INTRAVENOUS

## 2015-02-17 MED ORDER — IPRATROPIUM-ALBUTEROL 0.5-2.5 (3) MG/3ML IN SOLN: 3.0000 mL | Freq: Four times a day (QID) | RESPIRATORY_TRACT | Status: DC | PRN

## 2015-02-17 MED ORDER — MINERAL OIL RE ENEM
1.0000 | ENEMA | Freq: Once | RECTAL | Status: AC
  Administered 2015-02-17: 1 via RECTAL
  Filled 2015-02-17: qty 1

## 2015-02-17 MED ORDER — METOPROLOL TARTRATE 25 MG PO TABS
12.5000 mg | ORAL_TABLET | Freq: Two times a day (BID) | ORAL | Status: DC
  Administered 2015-02-17 – 2015-02-18 (×3): 12.5 mg via ORAL
  Filled 2015-02-17 (×5): qty 1

## 2015-02-17 MED ORDER — HYDRALAZINE HCL 20 MG/ML IJ SOLN: 5.0000 mg | INTRAMUSCULAR | Status: DC | PRN

## 2015-02-17 MED ORDER — LIDOCAINE HCL 2 % EX GEL
CUTANEOUS | Status: AC
  Administered 2015-02-17: 1
  Filled 2015-02-17: qty 10

## 2015-02-17 MED ORDER — ADULT MULTIVITAMIN W/MINERALS CH
1.0000 | ORAL_TABLET | Freq: Every day | ORAL | Status: DC
  Administered 2015-02-17 – 2015-02-21 (×5): 1 via ORAL
  Filled 2015-02-17 (×5): qty 1

## 2015-02-17 MED ORDER — TRAMADOL HCL 50 MG PO TABS: 50.0000 mg | ORAL_TABLET | Freq: Four times a day (QID) | ORAL | Status: DC | PRN

## 2015-02-17 MED ORDER — CALCIUM CARBONATE ANTACID 500 MG PO CHEW
1.0000 | CHEWABLE_TABLET | Freq: Every day | ORAL | Status: DC
  Administered 2015-02-17 – 2015-02-21 (×5): 200 mg via ORAL
  Filled 2015-02-17 (×5): qty 1

## 2015-02-17 MED ORDER — MELOXICAM 7.5 MG PO TABS
7.5000 mg | ORAL_TABLET | Freq: Every day | ORAL | Status: DC
  Administered 2015-02-17 – 2015-02-21 (×5): 7.5 mg via ORAL
  Filled 2015-02-17 (×6): qty 1

## 2015-02-17 MED ORDER — BACLOFEN 10 MG PO TABS
10.0000 mg | ORAL_TABLET | Freq: Three times a day (TID) | ORAL | Status: DC
  Administered 2015-02-17 – 2015-02-21 (×14): 10 mg via ORAL
  Filled 2015-02-17 (×14): qty 1

## 2015-02-17 MED ORDER — CRANBERRY 425 MG PO CAPS: 425.0000 mg | ORAL_CAPSULE | Freq: Two times a day (BID) | ORAL | Status: DC

## 2015-02-17 MED ORDER — SODIUM CHLORIDE 0.9 % IV BOLUS (SEPSIS)
2000.0000 mL | Freq: Once | INTRAVENOUS | Status: AC
  Administered 2015-02-17: 2000 mL via INTRAVENOUS

## 2015-02-17 NOTE — ED Notes (Signed)
Dr. Norlene Campbell replaced supra pubic catheter, old catheter had large old blood clot at end.  Site cleaned, supra pubic replaced-no urine output/catheter irrigated and cames through penis.  Condom cath also applied to penis

## 2015-02-17 NOTE — Progress Notes (Signed)
Initial Nutrition Assessment  DOCUMENTATION CODES:  Not applicable  INTERVENTION: - Recommend diet advancement within 24 hours if medically feasible - RD to continue to monitor for needs  NUTRITION DIAGNOSIS:  Inadequate oral intake related to inability to eat as evidenced by NPO status.  GOAL:  Patient will meet greater than or equal to 90% of their needs  MONITOR:  PO intake, Diet advancement, Weight trends, I & O's, Labs  REASON FOR ASSESSMENT:  Low Braden    ASSESSMENT: Pt is a 64 year old with medical hx of multiple sclerosis, quadriplegia, neurogenic bladder, suprapubic catheter placement, depression, and recurrent UTI. Pt seen for low Braden. BMI indicates normal weight status. IBW based on quadriplegia.   Pt has been NPO since admission. No notes about pending swallow eval or reason for NPO; will monitor for this and possible diet advancement. Pt unable to meet needs. Pt denies abdominal pain or nausea this AM and reports good appetite PTA. He is unsure of UBW. Per review of weights, weight highly variable over the last few years.  Labs and medications reviewed.  Height:  Ht Readings from Last 1 Encounters:  02/17/15  (1.803 m)    Weight:  Wt Readings from Last 1 Encounters:  02/17/15 170 lb 3.1 oz (77.2 kg)    Ideal Body Weight:  70.4 kg (kg)  Wt Readings from Last 10 Encounters:  02/17/15 170 lb 3.1 oz (77.2 kg)  12/25/12 183 lb 10.3 oz (83.3 kg)  07/20/12 160 lb 7.9 oz (72.8 kg)  04/15/12 148 lb 5.9 oz (67.3 kg)  01/12/11 120 lb (54.432 kg)  01/08/11 117 lb 12.8 oz (53.434 kg)  10/18/10 123 lb (55.792 kg)  08/16/10 114 lb (51.71 kg)    BMI:  Body mass index is 23.75 kg/(m^2).  Estimated Nutritional Needs:  Kcal:  1550-1750  Protein:  70-90 grams  Fluid:  1.5-2L/day  Skin:  Wound (see comment)  Diet Order:  Diet regular Room service appropriate?: Yes; Fluid consistency:: Thin  EDUCATION NEEDS:  No education needs at this  time.   Intake/Output Summary (Last 24 hours) at 02/17/15 1133 Last data filed at 02/17/15 0121  Gross per 24 hour  Intake   1000 ml  Output      0 ml  Net   1000 ml    Last BM:  Disimpaction 5/5 AM   Trenton Gammon, RD, LDN Inpatient Clinical Dietitian Pager # [phone] After hours/weekend pager # [phone]

## 2015-02-17 NOTE — Consult Note (Signed)
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 Known to our department from previous admissions. Wound type:Pressure Pressure Ulcer POA: Yes Measurement:5cm x 5cm x 0.2cm Wound EAV:WUJW tissue evident and indicative of previous wound healing. Surface frayed tissue indicative of constant shearing of tissues. Drainage (amount, consistency, odor) scant serous Periwound:intact, dry Dressing procedure/placement/frequency:I will provide a therapeutic mattress replacement with low air loss feature, also Prevalon pressure redistribution boots.  A silicone dressing with minimize friction and shear as will the mattress replacement. Turning and repositioning per our house protocol is in place. WOC nursing team will not follow, but will remain available to this patient, the nursing and medical team.  Please re-consult if needed. Thanks, Ladona Mow, MSN, RN, GNP, [place], CWON-AP [phone])

## 2015-02-17 NOTE — Progress Notes (Signed)
Soap Suds Enema given per orders. Results at time of enema were moderate amount of light brown stool.

## 2015-02-17 NOTE — ED Provider Notes (Signed)
CSN: [id number]     Arrival date & time 02/16/15  2159 History   First MD Initiated Contact with Patient 02/16/15 2301     Chief Complaint  Patient presents with  . Respiratory Distress     (Consider location/radiation/quality/duration/timing/severity/associated sxs/prior Treatment) HPI 64 year old male presents to the emergency department from nursing facility via EMS with complaint of fever.  Patient recently ended course of antibiotics for pneumonia.  Patient has history of MS, neurogenic bladder, suprapubic catheter and placed.  Patient with abdominal distention and pain with palpation of the abdomen.  Patient is unable to give history secondary to MS. Past Medical History  Diagnosis Date  . MS (multiple sclerosis)     bedridden since 1986  . Neurogenic bladder     with suprapubic catheter  . Dysphagia   . Personal history of PE (pulmonary embolism)     on coumadin  . Hypertension    Past Surgical History  Procedure Laterality Date  . Tonsillectomy and adenoidectomy    . Suprapubic catheter placement      11/11   Family History  Problem Relation Age of Onset  . Heart disease Father    History  Substance Use Topics  . Smoking status: Never Smoker   . Smokeless tobacco: Never Used  . Alcohol Use: No    Review of Systems  Level V caveat, language barrier  Allergies  Review of patient's allergies indicates no known allergies.  Home Medications   Prior to Admission medications   Medication Sig Start Date End Date Taking? Authorizing Provider  acetaminophen (TYLENOL) 325 MG tablet Take 650 mg by mouth 4 (four) times daily.   Yes Historical Provider, MD  acetaminophen (TYLENOL) 650 MG suppository Place 650 mg rectally every 6 (six) hours as needed for fever.   Yes Historical Provider, MD  baclofen (LIORESAL) 10 MG tablet Take 10 mg by mouth 3 (three) times daily.   Yes Historical Provider, MD  calcium carbonate (TUMS - DOSED IN MG ELEMENTAL CALCIUM) 500 MG chewable  tablet Chew 1 tablet by mouth daily.   Yes Historical Provider, MD  citalopram (CELEXA) 10 MG tablet Take 10 mg by mouth daily.    Yes Historical Provider, MD  cloNIDine (CATAPRES) 0.1 MG tablet Take 0.1 mg by mouth daily.   Yes Historical Provider, MD  Cranberry 425 MG CAPS Take 425 mg by mouth 2 (two) times daily.    Yes Historical Provider, MD  docusate sodium (COLACE) 100 MG capsule Take 100 mg by mouth 2 (two) times daily.   Yes Historical Provider, MD  ipratropium-albuterol (DUONEB) 0.5-2.5 (3) MG/3ML SOLN Take 3 mLs by nebulization every 6 (six) hours as needed. Wheezing and sob   Yes Historical Provider, MD  meloxicam (MOBIC) 7.5 MG tablet Take 7.5 mg by mouth daily.   Yes Historical Provider, MD  metoprolol (LOPRESSOR) 50 MG tablet Take 50 mg by mouth 2 (two) times daily. Along with Metoprolol tartrate 50mg  for a total of = 75mg  bid   Yes Historical Provider, MD  metoprolol tartrate (LOPRESSOR) 25 MG tablet Take 12.5 mg by mouth 2 (two) times daily.   Yes Historical Provider, MD  Multiple Vitamin (MULITIVITAMIN WITH MINERALS) TABS Take 1 tablet by mouth daily.   Yes Historical Provider, MD  polyethylene glycol (MIRALAX / GLYCOLAX) packet Take 17 g by mouth daily.   Yes Historical Provider, MD  polyvinyl alcohol (LIQUIFILM TEARS) 1.4 % ophthalmic solution Place 1 drop into both eyes daily.    Yes Historical  Provider, MD  traMADol (ULTRAM) 50 MG tablet Take 50 mg by mouth every 6 (six) hours as needed for pain.   Yes Historical Provider, MD  cephALEXin (KEFLEX) 500 MG capsule Take 1 capsule (500 mg total) by mouth 4 (four) times daily. Patient not taking: Reported on 11/19/2014 07/27/14   Arthor Captain, PA-C  sulfamethoxazole-trimethoprim (SEPTRA DS) 800-160 MG per tablet Take 1 tablet by mouth every 12 (twelve) hours. Patient not taking: Reported on 02/16/2015 11/19/14   Emilia Beck, PA-C   BP 97/58 mmHg  Pulse 103  Temp(Src) 101.3 F (38.5 C) (Rectal)  Resp 24  SpO2 100% Physical  Exam  Constitutional: He is oriented to person, place, and time. He appears well-developed and well-nourished.  Chronically ill male, bedridden.    HENT:  Head: Normocephalic and atraumatic.  Nose: Nose normal.  Mouth/Throat: Oropharynx is clear and moist.  Eyes: Conjunctivae and EOM are normal. Pupils are equal, round, and reactive to light.  Neck: Normal range of motion. Neck supple. No JVD present. No tracheal deviation present. No thyromegaly present.  Cardiovascular: Normal rate, regular rhythm, normal heart sounds and intact distal pulses.  Exam reveals no gallop and no friction rub.   No murmur heard. Pulmonary/Chest: Effort normal and breath sounds normal. No stridor. No respiratory distress. He has no wheezes. He has no rales. He exhibits no tenderness.   Patient has coarse rhonchi , sounds, mainly upper airway.  Abdominal: Soft. Bowel sounds are normal. He exhibits distension. He exhibits no mass. There is tenderness (patient has tenderness to palpation of upper abdomen). There is no rebound and no guarding.  Genitourinary:  Suprapubic catheter in place, leakage around the catheter noted.  Urine is foul smelling.  Musculoskeletal: Normal range of motion. He exhibits edema. He exhibits no tenderness.  Lymphadenopathy:    He has no cervical adenopathy.  Neurological: He is alert and oriented to person, place, and time. He displays normal reflexes. He exhibits normal muscle tone. Coordination normal.  Skin: Skin is warm and dry. No rash noted. No erythema. No pallor.  Psychiatric: He has a normal mood and affect. His behavior is normal. Judgment and thought content normal.  Nursing note and vitals reviewed.   ED Course  Procedures (including critical care time) Labs Review Labs Reviewed  CBC - Abnormal; Notable for the following:    WBC 13.8 (*)    RDW 15.8 (*)    All other components within normal limits  URINALYSIS, ROUTINE W REFLEX MICROSCOPIC - Abnormal; Notable for the  following:    Color, Urine AMBER (*)    APPearance CLOUDY (*)    Specific Gravity, Urine >1.046 (*)    Hgb urine dipstick LARGE (*)    Bilirubin Urine SMALL (*)    Protein, ur 30 (*)    Leukocytes, UA SMALL (*)    All other components within normal limits  HEPATIC FUNCTION PANEL - Abnormal; Notable for the following:    Albumin 3.3 (*)    All other components within normal limits  LIPASE, BLOOD - Abnormal; Notable for the following:    Lipase 18 (*)    All other components within normal limits  BASIC METABOLIC PANEL - Abnormal; Notable for the following:    Chloride 112 (*)    Glucose, Bld 111 (*)    Calcium 8.4 (*)    Anion gap 4 (*)    All other components within normal limits  URINE MICROSCOPIC-ADD ON - Abnormal; Notable for the following:  Bacteria, UA MANY (*)    Casts HYALINE CASTS (*)    All other components within normal limits  CULTURE, BLOOD (ROUTINE X 2)  CULTURE, BLOOD (ROUTINE X 2)  URINE CULTURE  BRAIN NATRIURETIC PEPTIDE  I-STAT TROPOININ, ED  I-STAT CG4 LACTIC ACID, ED  I-STAT CG4 LACTIC ACID, ED    Imaging Review Ct Abdomen Pelvis W Contrast  02/17/2015   CLINICAL DATA:  Abdominal distention and pain  EXAM: CT ABDOMEN AND PELVIS WITH CONTRAST  TECHNIQUE: Multidetector CT imaging of the abdomen and pelvis was performed using the standard protocol following bolus administration of intravenous contrast.  CONTRAST:  OMNIPAQUE IOHEXOL 300 MG/ML  SOLN  COMPARISON:  Radiographs 12/22/2012  FINDINGS: There is a large volume of stool distending the rectum. This could represent fecal impaction. Generous stool volume is present throughout the remainder of the colon as well. There is no bowel obstruction. Small bowel is unremarkable. Stomach is unremarkable.  There are unremarkable appearances of the liver, spleen, pancreas, adrenals and kidneys.  The abdominal aorta is normal in caliber.  There is a suprapubic urinary bladder catheter.  There is a fat containing left  inguinal hernia.  There is linear scarring or atelectasis in the left lung base.  No significant skeletal lesions are evident.  IMPRESSION: *Generous colonic stool volume. Prominent distention of the rectum with stool, suggesting fecal impaction. *Fat containing left inguinal hernia   Electronically Signed   By: Ellery Plunk M.D.   On: 02/17/2015 02:26   Dg Chest Port 1 View  02/16/2015   CLINICAL DATA:  Respiratory distress, shortness of breath, unresponsiveness.  EXAM: PORTABLE CHEST - 1 VIEW  COMPARISON:  Chest radiograph December 22, 2012  FINDINGS: The cardiac silhouette appears mildly enlarged, similar. Mediastinal silhouette is unremarkable for this low inspiratory portable examination with crowded vascular markings. Similar biapical pleural thickening without pleural effusion or focal consolidation. No pneumothorax. Soft tissue planes and included osseous structures are nonsuspicious.  IMPRESSION: Stable mild cardiomegaly, no acute pulmonary process.   Electronically Signed   By: Awilda Metro   On: 02/16/2015 22:50     EKG Interpretation   Date/Time:  Wednesday Feb 16 2015 22:42:17 EDT Ventricular Rate:  115 PR Interval:  149 QRS Duration: 71 QT Interval:  319 QTC Calculation: 441 R Axis:   -81 Text Interpretation:  Sinus tachycardia Abnormal R-wave progression, late  transition Inferior infarct, old No significant change since last tracing  Confirmed by Dyanna Seiter  MD, Wyoma Genson ([id number]) on 02/16/2015 11:06:10 PM      MDM   Final diagnoses:  Fever, unspecified fever cause  Urinary tract infection associated with catheterization of urinary tract, initial encounter    64 yo male with fever, abdominal pain, distension.  Lactate is normal, bp has been low, but responsive to fluid boluses.  Lung sounds are coarse, but sound to be mainly upper airway, improved with suctioning.  No infectious process on cxr.  Await labs, CT abd/pelvis.  3:30 AM CT scan shows significant fecal impaction.   Urinalysis consistent with infection.  Will place on Rocephin.  Will discuss with hospitalist for admission given concern for early systemic symptoms of infection  Marisa Severin, MD 02/17/15 [id number]

## 2015-02-17 NOTE — Progress Notes (Signed)
PHARMACIST - PHYSICIAN ORDER COMMUNICATION  CONCERNING: P&T Medication Policy on Herbal Medications  DESCRIPTION:  This patient's order for:  Cranberry Caps 425mg   has been noted.  This product(s) is classified as an "herbal" or natural product. Due to a lack of definitive safety studies or FDA approval, nonstandard manufacturing practices, plus the potential risk of unknown drug-drug interactions while on inpatient medications, the Pharmacy and Therapeutics Committee does not permit the use of "herbal" or natural products of this type within [facility].   ACTION TAKEN: The pharmacy department is unable to verify this order at this time and your patient has been informed of this safety policy. Please reevaluate patient's clinical condition at discharge and address if the herbal or natural product(s) should be resumed at that time.   Thanks Lorenza Evangelist 02/17/2015 6:17 AM

## 2015-02-17 NOTE — ED Notes (Signed)
Admitting MD at bedside.

## 2015-02-17 NOTE — H&P (Addendum)
[facility] History and Physical  [name] LKG:[id number] DOB: 1951-05-17 DOA: 02/16/2015  Referring physician: ED physician PCP: No primary care provider on file.  Specialists:   Chief Complaint: Fever and abdominal pain  HPI: [name] is a 64 y.o. male with PMH of multiple sclerosis, quadriplegia, neurogenic bladder, suprapubic catheter placement, depression, recurrent UTI, who presents with fever and abdominal pain.  Because of the severe sequela from multiple sclerosis, patient cannot provide history in detail. The history is obtained from ED and EMS report. Patient is from nursing home. He was brought to the emergency room by EMS with complaint of fever. He has abdominal distention and abdominal pain to palpation. He could answer a few questions when I saw patient in the emergency room. He denies dysuria, cough, chest pain and shortness of breath.  In ED, patient was found to have negative lipase, temperature 101.3, tachycardia, negative chest x-ray, WBC 13.8, electrolytes okay, lactate 1.63. Urinalysis showed small amount of leukocytes. CT abdomen/pelvis showed fecal impact and fat containing left inguinal hernia. Patient is admitted to inpatient for further evaluation and treatment.  Where does patient live?  SNF    Can patient participate in ADLs? none  Review of Systems:   General: has fevers and chills, no changes in body weight, poor appetite, has fatigure HEENT: no blurry vision, hearing changes or sore throat Pulm: No SOB or coughing CV: no chest pain, palpitations Abd: no nausea/vomiting, has abdominal pain and constipation GU: no dysuria, hematuria, polyuria Ext: no leg edema Neuro: Quadriplegia Skin: no rash MSK: Unable to move legs Heme: No easy bruising.  Travel history:No recent long distant travel.  Allergy: No Known Allergies  Past Medical History  Diagnosis Date  . MS (multiple sclerosis)     bedridden since 1986  . Neurogenic bladder      with suprapubic catheter  . Dysphagia   . Personal history of PE (pulmonary embolism)     on coumadin  . Hypertension   . Quadriplegia     Past Surgical History  Procedure Laterality Date  . Tonsillectomy and adenoidectomy    . Suprapubic catheter placement      11/11    Social History:  reports that he has never smoked. He has never used smokeless tobacco. He reports that he does not drink alcohol or use illicit drugs.  Family History:  Family History  Problem Relation Age of Onset  . Heart disease Father      Prior to Admission medications   Medication Sig Start Date End Date Taking? Authorizing Provider  acetaminophen (TYLENOL) 325 MG tablet Take 650 mg by mouth 4 (four) times daily.   Yes Historical Provider, MD  acetaminophen (TYLENOL) 650 MG suppository Place 650 mg rectally every 6 (six) hours as needed for fever.   Yes Historical Provider, MD  baclofen (LIORESAL) 10 MG tablet Take 10 mg by mouth 3 (three) times daily.   Yes Historical Provider, MD  calcium carbonate (TUMS - DOSED IN MG ELEMENTAL CALCIUM) 500 MG chewable tablet Chew 1 tablet by mouth daily.   Yes Historical Provider, MD  citalopram (CELEXA) 10 MG tablet Take 10 mg by mouth daily.    Yes Historical Provider, MD  cloNIDine (CATAPRES) 0.1 MG tablet Take 0.1 mg by mouth daily.   Yes Historical Provider, MD  Cranberry 425 MG CAPS Take 425 mg by mouth 2 (two) times daily.    Yes Historical Provider, MD  docusate sodium (COLACE) 100 MG capsule Take 100  mg by mouth 2 (two) times daily.   Yes Historical Provider, MD  ipratropium-albuterol (DUONEB) 0.5-2.5 (3) MG/3ML SOLN Take 3 mLs by nebulization every 6 (six) hours as needed. Wheezing and sob   Yes Historical Provider, MD  meloxicam (MOBIC) 7.5 MG tablet Take 7.5 mg by mouth daily.   Yes Historical Provider, MD  metoprolol (LOPRESSOR) 50 MG tablet Take 50 mg by mouth 2 (two) times daily. Along with Metoprolol tartrate 50mg  for a total of = 75mg  bid   Yes  Historical Provider, MD  metoprolol tartrate (LOPRESSOR) 25 MG tablet Take 12.5 mg by mouth 2 (two) times daily.   Yes Historical Provider, MD  Multiple Vitamin (MULITIVITAMIN WITH MINERALS) TABS Take 1 tablet by mouth daily.   Yes Historical Provider, MD  polyethylene glycol (MIRALAX / GLYCOLAX) packet Take 17 g by mouth daily.   Yes Historical Provider, MD  polyvinyl alcohol (LIQUIFILM TEARS) 1.4 % ophthalmic solution Place 1 drop into both eyes daily.    Yes Historical Provider, MD  traMADol (ULTRAM) 50 MG tablet Take 50 mg by mouth every 6 (six) hours as needed for pain.   Yes Historical Provider, MD  cephALEXin (KEFLEX) 500 MG capsule Take 1 capsule (500 mg total) by mouth 4 (four) times daily. Patient not taking: Reported on 11/19/2014 07/27/14   Arthor Captain, PA-C  sulfamethoxazole-trimethoprim (SEPTRA DS) 800-160 MG per tablet Take 1 tablet by mouth every 12 (twelve) hours. Patient not taking: Reported on 02/16/2015 11/19/14   Emilia Beck, PA-C    Physical Exam: Filed Vitals:   02/17/15 0230 02/17/15 0300 02/17/15 0331 02/17/15 0337  BP: 102/59 97/59 108/67   Pulse:   97   Temp:    99.2 F (37.3 C)  TempSrc:    Rectal  Resp: 20 16 23    SpO2:   95%    General: Not in acute distress.Chronically ill male, bedridden.  HEENT:         Eyes: PERRL, EOMI, no scleral icterus       ENT: No discharge from the ears and nose, no pharynx injection, no tonsillar enlargement.        Neck: No JVD, no bruit, no mass felt. Heme: No neck or axillary lymph node enlargement Cardiac: S1/S2, RRR, No murmurs, No gallops or rubs Pulm: has rhonchi bilaterally, no rales or wheezing  Abd: Soft, distended, Tenderness to palpation of upper abdomen, no rebound pain, no organomegaly, BS present. Suprapubic catheter in place, leakage around the catheter noted.  Urine is foul smelling.  Ext: No edema bilaterally. 2+DP/PT pulse bilaterally. Right index finger missing Musculoskeletal: No joint deformities,  erythema, or stiffness, ROM full Skin: No rashes.  Neuro: Alert, not oriented X3, cranial nerves II-XII grossly intact, quadriplegia. Psych: Patient is not psychotic, no suicidal or hemocidal ideation.  Labs on Admission:  Basic Metabolic Panel:  Recent Labs Lab 02/17/15 0045  NA 139  K 4.1  CL 112*  CO2 23  GLUCOSE 111*  BUN 15  CREATININE 1.03  CALCIUM 8.4*   Liver Function Tests:  Recent Labs Lab 02/17/15 0045  AST 35  ALT 40  ALKPHOS 60  BILITOT 0.6  PROT 6.5  ALBUMIN 3.3*    Recent Labs Lab 02/17/15 0045  LIPASE 18*   No results for input(s): AMMONIA in the last 168 hours. CBC:  Recent Labs Lab 02/16/15 2229  WBC 13.8*  HGB 14.7  HCT 44.3  MCV 89.3  PLT 194   Cardiac Enzymes: No results for input(s): CKTOTAL,  CKMB, CKMBINDEX, TROPONINI in the last 168 hours.  BNP (last 3 results)  Recent Labs  02/16/15 2230  BNP 35.7    ProBNP (last 3 results) No results for input(s): PROBNP in the last 8760 hours.  CBG: No results for input(s): GLUCAP in the last 168 hours.  Radiological Exams on Admission: Ct Abdomen Pelvis W Contrast  02/17/2015   CLINICAL DATA:  Abdominal distention and pain  EXAM: CT ABDOMEN AND PELVIS WITH CONTRAST  TECHNIQUE: Multidetector CT imaging of the abdomen and pelvis was performed using the standard protocol following bolus administration of intravenous contrast.  CONTRAST:  OMNIPAQUE IOHEXOL 300 MG/ML  SOLN  COMPARISON:  Radiographs 12/22/2012  FINDINGS: There is a large volume of stool distending the rectum. This could represent fecal impaction. Generous stool volume is present throughout the remainder of the colon as well. There is no bowel obstruction. Small bowel is unremarkable. Stomach is unremarkable.  There are unremarkable appearances of the liver, spleen, pancreas, adrenals and kidneys.  The abdominal aorta is normal in caliber.  There is a suprapubic urinary bladder catheter.  There is a fat containing left  inguinal hernia.  There is linear scarring or atelectasis in the left lung base.  No significant skeletal lesions are evident.  IMPRESSION: *Generous colonic stool volume. Prominent distention of the rectum with stool, suggesting fecal impaction. *Fat containing left inguinal hernia   Electronically Signed   By: Ellery Plunk M.D.   On: 02/17/2015 02:26   Dg Chest Port 1 View  02/16/2015   CLINICAL DATA:  Respiratory distress, shortness of breath, unresponsiveness.  EXAM: PORTABLE CHEST - 1 VIEW  COMPARISON:  Chest radiograph December 22, 2012  FINDINGS: The cardiac silhouette appears mildly enlarged, similar. Mediastinal silhouette is unremarkable for this low inspiratory portable examination with crowded vascular markings. Similar biapical pleural thickening without pleural effusion or focal consolidation. No pneumothorax. Soft tissue planes and included osseous structures are nonsuspicious.  IMPRESSION: Stable mild cardiomegaly, no acute pulmonary process.   Electronically Signed   By: Awilda Metro   On: 02/16/2015 22:50    EKG: Independently reviewed.  Abnormal findings:  LAD and tachycardia  Assessment/Plan Principal Problem:   UTI (lower urinary tract infection) Active Problems:   Multiple sclerosis   HYPERTENSION, BENIGN ESSENTIAL   Neurogenic bladder   Decubitus ulcer of lower back   Sepsis   Fever   Constipation  UTI and sepsis secondary to UTI: Patient's fever is likely caused by UTI. Chest x-ray is negative for acute abnormalities. Patient is septic on admission with leukocytosis, fever and tachycardia. Her blood pressure is soft.  -will admit tele bed -start IV rocephin -follow up blood and urine culture -will get Procalcitonin and trend lactic acid level per sepsis protocol -IVF: received 3L of NS bolus in ED, followed by 75 cc/h  HTN: Initial blood pressure was 87/59, which improved to 102/59 after 1 L of normal saline bolus in emergency room. -hold clonidine -Decrease  her metoprolol dose from 75-25 mg twice a day -Hydralazine IV when necessary  Constipation: His Abdominal pain is most likely caused by constipation as evidenced by CT abdomen/pelvis. -Enema -MiraLAX, Colace  Multiple sclerosis: Has quadriplegia and neurogenic bladder. No acute issues. -Continue baclofen - deep suction prn given his weak coughing capacity  Decubitus ulcers of sacrum: -Consult wound care team  depression: Stable. No suicidal and homicidal ideations. -Continue Celexa   DVT ppx: SQ Heparin     Code Status: Full code Family Communication: None  at bed side. Disposition Plan: Admit to inpatient   Date of Service 02/17/2015    Lorretta Harp [facility] Pager [phone]  If 7PM-7AM, please contact night-coverage [email or web address] Password [email or web address] 02/17/2015, 4:26 AM

## 2015-02-17 NOTE — ED Notes (Signed)
Patient disimpacted for large amount hard formed brown stool.  Abdomen not as distended and patient states his abdomen feels better

## 2015-02-17 NOTE — Progress Notes (Signed)
I have seen and assessed patient and agree with Dr. Evorn Gong assessment and plan. Patient noted to have significant constipation with complaints of abdominal pain and UTI. Patient has been pancultured and cultures are pending. Clinical improvement. Patient denies any abdominal pain. Will give patient a soapsuds enema. Supportive care. Continue empiric antibiotics. Follow.

## 2015-02-17 NOTE — ED Notes (Signed)
Patient transported to CT 

## 2015-02-17 NOTE — Progress Notes (Signed)
ANTIBIOTIC CONSULT NOTE - INITIAL  Pharmacy Consult for Rocephin Indication: UTI  No Known Allergies  Patient Measurements: Height: 5\' 11"  (180.3 cm) Weight: 170 lb 4.8 oz (77.248 kg) IBW/kg (Calculated) : 75.3   Vital Signs: Temp: 98.2 F (36.8 C) (05/05 0538) Temp Source: Oral (05/05 0538) BP: 121/64 mmHg (05/05 0538) Pulse Rate: 87 (05/05 0538) Intake/Output from previous day: 05/04 0701 - 05/05 0700 In: 1000 [I.V.:1000] Out: -  Intake/Output from this shift: Total I/O In: 1000 [I.V.:1000] Out: -   Labs:  Recent Labs  02/16/15 2229 02/17/15 0045  WBC 13.8*  --   HGB 14.7  --   PLT 194  --   CREATININE  --  1.03   Estimated Creatinine Clearance: 79.2 mL/min (by C-G formula based on Cr of 1.03). No results for input(s): VANCOTROUGH, VANCOPEAK, VANCORANDOM, GENTTROUGH, GENTPEAK, GENTRANDOM, TOBRATROUGH, TOBRAPEAK, TOBRARND, AMIKACINPEAK, AMIKACINTROU, AMIKACIN in the last 72 hours.   Microbiology: No results found for this or any previous visit (from the past 720 hour(s)).  Medical History: Past Medical History  Diagnosis Date  . MS (multiple sclerosis)     bedridden since 1986  . Neurogenic bladder     with suprapubic catheter  . Dysphagia   . Personal history of PE (pulmonary embolism)     on coumadin  . Hypertension   . Quadriplegia     Medications:  Prescriptions prior to admission  Medication Sig Dispense Refill Last Dose  . acetaminophen (TYLENOL) 325 MG tablet Take 650 mg by mouth 4 (four) times daily.   02/16/2015 at 2000  . acetaminophen (TYLENOL) 650 MG suppository Place 650 mg rectally every 6 (six) hours as needed for fever.   unknown at unknown  . baclofen (LIORESAL) 10 MG tablet Take 10 mg by mouth 3 (three) times daily.   02/16/2015 at 2100  . calcium carbonate (TUMS - DOSED IN MG ELEMENTAL CALCIUM) 500 MG chewable tablet Chew 1 tablet by mouth daily.   02/16/2015 at 1800  . citalopram (CELEXA) 10 MG tablet Take 10 mg by mouth daily.     02/16/2015 at 0900  . cloNIDine (CATAPRES) 0.1 MG tablet Take 0.1 mg by mouth daily.   02/16/2015 at 1730  . Cranberry 425 MG CAPS Take 425 mg by mouth 2 (two) times daily.    02/16/2015 at 1600  . docusate sodium (COLACE) 100 MG capsule Take 100 mg by mouth 2 (two) times daily.   02/16/2015 at 1600  . ipratropium-albuterol (DUONEB) 0.5-2.5 (3) MG/3ML SOLN Take 3 mLs by nebulization every 6 (six) hours as needed. Wheezing and sob   unknown at unknown  . meloxicam (MOBIC) 7.5 MG tablet Take 7.5 mg by mouth daily.   02/16/2015 at 0730  . metoprolol (LOPRESSOR) 50 MG tablet Take 50 mg by mouth 2 (two) times daily. Along with Metoprolol tartrate 50mg  for a total of = 75mg  bid   02/16/2015 at 1800  . metoprolol tartrate (LOPRESSOR) 25 MG tablet Take 12.5 mg by mouth 2 (two) times daily.   02/16/2015 at 1800  . Multiple Vitamin (MULITIVITAMIN WITH MINERALS) TABS Take 1 tablet by mouth daily.   02/16/2015 at 0900  . polyethylene glycol (MIRALAX / GLYCOLAX) packet Take 17 g by mouth daily.   02/16/2015 at 0730  . polyvinyl alcohol (LIQUIFILM TEARS) 1.4 % ophthalmic solution Place 1 drop into both eyes daily.    02/16/2015 at 0900  . traMADol (ULTRAM) 50 MG tablet Take 50 mg by mouth every 6 (six) hours as  needed for pain.   unknown at unknown  . cephALEXin (KEFLEX) 500 MG capsule Take 1 capsule (500 mg total) by mouth 4 (four) times daily. (Patient not taking: Reported on 11/19/2014) 20 capsule 0 Not Taking at Unknown time  . sulfamethoxazole-trimethoprim (SEPTRA DS) 800-160 MG per tablet Take 1 tablet by mouth every 12 (twelve) hours. (Patient not taking: Reported on 02/16/2015) 20 tablet 0    Scheduled:  . baclofen  10 mg Oral TID  . calcium carbonate  1 tablet Oral Daily  . [START ON 02/18/2015] cefTRIAXone (ROCEPHIN)  IV  1 g Intravenous Q24H  . citalopram  10 mg Oral Daily  . docusate sodium  100 mg Oral BID  . heparin  5,000 Units Subcutaneous 3 times per day  . meloxicam  7.5 mg Oral Daily  . metoprolol tartrate  12.5  mg Oral BID  . multivitamin with minerals  1 tablet Oral Daily  . polyethylene glycol  17 g Oral Daily  . polyvinyl alcohol  1 drop Both Eyes Daily  . sodium chloride  3 mL Intravenous Q12H   Infusions:   Assessment: 74 yoM with hx of MS, quadraplegia, neurogenic bladder, suprapubic catheter placement c/o fever and abdominal pain.  Rocephin per Rx for UTI.   Goal of Therapy:  Treat UTI  Plan:  Rocephin 1Gm IV q24h F/u cultures as needed  Lorenza Evangelist 02/17/2015,6:22 AM

## 2015-02-18 DIAGNOSIS — R7881 Bacteremia: Secondary | ICD-10-CM | POA: Diagnosis present

## 2015-02-18 DIAGNOSIS — A419 Sepsis, unspecified organism: Secondary | ICD-10-CM

## 2015-02-18 DIAGNOSIS — A499 Bacterial infection, unspecified: Secondary | ICD-10-CM

## 2015-02-18 LAB — BASIC METABOLIC PANEL
Anion gap: 7 (ref 5–15)
BUN: 9 mg/dL (ref 6–20)
CHLORIDE: 112 mmol/L — AB (ref 101–111)
CO2: 22 mmol/L (ref 22–32)
CREATININE: 0.72 mg/dL (ref 0.61–1.24)
Calcium: 8.3 mg/dL — ABNORMAL LOW (ref 8.9–10.3)
GFR calc Af Amer: >60 mL/min (ref >60)
GFR calc non Af Amer: >60 mL/min (ref >60)
Glucose, Bld: 94 mg/dL (ref 70–99)
Potassium: 3.8 mmol/L (ref 3.5–5.1)
Sodium: 141 mmol/L (ref 135–145)

## 2015-02-18 LAB — CBC
HCT: 41.1 % (ref 39.0–52.0)
Hemoglobin: 13.1 g/dL (ref 13.0–17.0)
MCH: 29.3 pg (ref 26.0–34.0)
MCHC: 31.9 g/dL (ref 30.0–36.0)
MCV: 91.9 fL (ref 78.0–100.0)
PLATELETS: 130 10*3/uL — AB (ref 150–400)
RBC: 4.47 MIL/uL (ref 4.22–5.81)
RDW: 15.4 % (ref 11.5–15.5)
WBC: 8 10*3/uL (ref 4.0–10.5)

## 2015-02-18 LAB — GLUCOSE, CAPILLARY: Glucose-Capillary: 82 mg/dL (ref 70–99)

## 2015-02-18 LAB — MAGNESIUM: Magnesium: 1.7 mg/dL (ref 1.7–2.4)

## 2015-02-18 MED ORDER — VANCOMYCIN HCL IN DEXTROSE 1-5 GM/200ML-% IV SOLN
1000.0000 mg | Freq: Two times a day (BID) | INTRAVENOUS | Status: DC
  Administered 2015-02-18 – 2015-02-19 (×2): 1000 mg via INTRAVENOUS
  Filled 2015-02-18 (×3): qty 200

## 2015-02-18 MED ORDER — METOPROLOL TARTRATE 25 MG PO TABS
25.0000 mg | ORAL_TABLET | Freq: Two times a day (BID) | ORAL | Status: DC
  Administered 2015-02-18 – 2015-02-20 (×4): 25 mg via ORAL
  Filled 2015-02-18 (×3): qty 1

## 2015-02-18 MED ORDER — MAGNESIUM SULFATE 2 GM/50ML IV SOLN
2.0000 g | Freq: Once | INTRAVENOUS | Status: AC
  Administered 2015-02-18: 2 g via INTRAVENOUS
  Filled 2015-02-18: qty 50

## 2015-02-18 MED ORDER — VANCOMYCIN HCL IN DEXTROSE 750-5 MG/150ML-% IV SOLN
750.0000 mg | Freq: Three times a day (TID) | INTRAVENOUS | Status: DC
  Administered 2015-02-18 (×2): 750 mg via INTRAVENOUS
  Filled 2015-02-18 (×3): qty 150

## 2015-02-18 NOTE — Progress Notes (Signed)
Pt has a positive blood culture results. MD on call was notified via [email or web address]. Will continue to monitor pt.----Kaedin Hicklin, rn

## 2015-02-18 NOTE — Progress Notes (Signed)
ANTIBIOTIC CONSULT NOTE - follow-up  Pharmacy Consult for Vancomycin Indication: UTI, +GPC bacteremia  No Known Allergies  Patient Measurements: Height: 5\' 11"  (180.3 cm) Weight: 170 lb 3.1 oz (77.2 kg) IBW/kg (Calculated) : 75.3   Vital Signs: Temp: 98.4 F (36.9 C) (05/06 0532) Temp Source: Oral (05/06 0532) BP: 160/79 mmHg (05/06 0532) Pulse Rate: 85 (05/06 0532) Intake/Output from previous day: 05/05 0701 - 05/06 0700 In: 1171.3 [P.O.:50; I.V.:921.3; IV Piggyback:200] Out: 1125 [Urine:1125] Intake/Output from this shift:    Labs:  Recent Labs  02/16/15 2229 02/17/15 0045 02/17/15 0843 02/18/15 0400  WBC 13.8*  --  12.8* 8.0  HGB 14.7  --  12.3* 13.1  PLT 194  --  122* 130*  CREATININE  --  1.03 0.82 0.72   Estimated Creatinine Clearance: 102 mL/min (by C-G formula based on Cr of 0.72). No results for input(s): VANCOTROUGH, VANCOPEAK, VANCORANDOM, GENTTROUGH, GENTPEAK, GENTRANDOM, TOBRATROUGH, TOBRAPEAK, TOBRARND, AMIKACINPEAK, AMIKACINTROU, AMIKACIN in the last 72 hours.   Microbiology: Recent Results (from the past 720 hour(s))  Culture, blood (routine x 2)     Status: None (Preliminary result)   Collection Time: 02/16/15 10:22 PM  Result Value Ref Range Status   Specimen Description BLOOD LEFT HAND  Final   Special Requests BOTTLES DRAWN AEROBIC AND ANAEROBIC 3CC  Final   Culture   Final           BLOOD CULTURE RECEIVED NO GROWTH TO DATE CULTURE WILL BE HELD FOR 5 DAYS BEFORE ISSUING A FINAL NEGATIVE REPORT Performed at [facility]    Report Status PENDING  Incomplete  Culture, blood (routine x 2)     Status: None (Preliminary result)   Collection Time: 02/16/15 10:29 PM  Result Value Ref Range Status   Specimen Description BLOOD RIGHT HAND  Final   Special Requests BOTTLES DRAWN AEROBIC AND ANAEROBIC 5CC  Final   Culture   Final    GRAM POSITIVE COCCI IN CLUSTERS Note: Gram Stain Report Called to,Read Back By and Verified With: PEACE DORMON  ON 5.5.16 AT 10:15P BY WILEJ Performed at [facility]    Report Status PENDING  Incomplete  MRSA PCR Screening     Status: None   Collection Time: 02/17/15  6:19 AM  Result Value Ref Range Status   MRSA by PCR NEGATIVE NEGATIVE Final    Comment:        The GeneXpert MRSA Assay (FDA approved for NASAL specimens only), is one component of a comprehensive MRSA colonization surveillance program. It is not intended to diagnose MRSA infection nor to guide or monitor treatment for MRSA infections.     Medical History: Past Medical History  Diagnosis Date  . MS (multiple sclerosis)     bedridden since 1986  . Neurogenic bladder     with suprapubic catheter  . Dysphagia   . Personal history of PE (pulmonary embolism)     on coumadin  . Hypertension   . Quadriplegia     Scheduled:  . antiseptic oral rinse  7 mL Mouth Rinse BID  . baclofen  10 mg Oral TID  . calcium carbonate  1 tablet Oral Daily  . cefTRIAXone (ROCEPHIN)  IV  1 g Intravenous Q24H  . citalopram  10 mg Oral Daily  . docusate sodium  100 mg Oral BID  . heparin  5,000 Units Subcutaneous 3 times per day  . magnesium sulfate 1 - 4 g bolus IVPB  2 g Intravenous Once  .  meloxicam  7.5 mg Oral Daily  . metoprolol tartrate  12.5 mg Oral BID  . multivitamin with minerals  1 tablet Oral Daily  . polyethylene glycol  17 g Oral BID  . polyvinyl alcohol  1 drop Both Eyes Daily  . sodium chloride  3 mL Intravenous Q12H  . vancomycin  1,000 mg Intravenous Q12H   Infusions:  . sodium chloride 75 mL/hr (02/18/15 1000)   Assessment: 64 yoM with hx of MS, quadraplegia, neurogenic bladder, suprapubic catheter placement c/o fever and abdominal pain.  Admitted on 5/5.  Rocephin per Rx for UTI.   5/6:  Blood culture growing +GPC         Pharmacy consulted to dose Vancomycin  Goal of Therapy:  Treat UTI Vancomycin trough = 15-20 mcg/ml  Plan:   Continue Rocephin 1Gm IV q24h  Adjust vancomycin to Vancomycin  1gm IV q12h for quadripelgia/age, bedridden since 1986 per notes. SCr will overestimate renal fx  Check vancomycin trough level as needed  F/u final blood culture results and need to continue vancomycin (vs contaminant). Repeat blood cx and urine cx pending  Juliette Alcide, PharmD, BCPS.   Pager: [phone] 02/18/2015 10:42 AM

## 2015-02-18 NOTE — Progress Notes (Signed)
ANTIBIOTIC CONSULT NOTE - INITIAL  Pharmacy Consult for Vancomycin Indication: UTI, +GPC bacteremia  No Known Allergies  Patient Measurements: Height: 5\' 11"  (180.3 cm) Weight: 170 lb 3.1 oz (77.2 kg) IBW/kg (Calculated) : 75.3   Vital Signs: Temp: 98.2 F (36.8 C) (05/05 2049) Temp Source: Oral (05/05 2049) BP: 141/62 mmHg (05/05 2049) Pulse Rate: 88 (05/05 2049) Intake/Output from previous day: 05/05 0701 - 05/06 0700 In: 50 [P.O.:50] Out: 725 [Urine:725] Intake/Output from this shift: Total I/O In: -  Out: 375 [Urine:375]  Labs:  Recent Labs  02/16/15 2229 02/17/15 0045 02/17/15 0843  WBC 13.8*  --  12.8*  HGB 14.7  --  12.3*  PLT 194  --  122*  CREATININE  --  1.03 0.82   Estimated Creatinine Clearance: 99.5 mL/min (by C-G formula based on Cr of 0.82). No results for input(s): VANCOTROUGH, VANCOPEAK, VANCORANDOM, GENTTROUGH, GENTPEAK, GENTRANDOM, TOBRATROUGH, TOBRAPEAK, TOBRARND, AMIKACINPEAK, AMIKACINTROU, AMIKACIN in the last 72 hours.   Microbiology: Recent Results (from the past 720 hour(s))  Culture, blood (routine x 2)     Status: None (Preliminary result)   Collection Time: 02/16/15 10:29 PM  Result Value Ref Range Status   Specimen Description BLOOD RIGHT HAND  Final   Special Requests BOTTLES DRAWN AEROBIC AND ANAEROBIC 5CC  Final   Culture   Final    GRAM POSITIVE COCCI IN CLUSTERS Note: Gram Stain Report Called to,Read Back By and Verified With: PEACE DORMON ON 5.5.16 AT 10:15P BY WILEJ Performed at [facility]    Report Status PENDING  Incomplete  MRSA PCR Screening     Status: None   Collection Time: 02/17/15  6:19 AM  Result Value Ref Range Status   MRSA by PCR NEGATIVE NEGATIVE Final    Comment:        The GeneXpert MRSA Assay (FDA approved for NASAL specimens only), is one component of a comprehensive MRSA colonization surveillance program. It is not intended to diagnose MRSA infection nor to guide or monitor treatment  for MRSA infections.     Medical History: Past Medical History  Diagnosis Date  . MS (multiple sclerosis)     bedridden since 1986  . Neurogenic bladder     with suprapubic catheter  . Dysphagia   . Personal history of PE (pulmonary embolism)     on coumadin  . Hypertension   . Quadriplegia     Medications:  Prescriptions prior to admission  Medication Sig Dispense Refill Last Dose  . acetaminophen (TYLENOL) 325 MG tablet Take 650 mg by mouth 4 (four) times daily.   02/16/2015 at 2000  . acetaminophen (TYLENOL) 650 MG suppository Place 650 mg rectally every 6 (six) hours as needed for fever.   unknown at unknown  . baclofen (LIORESAL) 10 MG tablet Take 10 mg by mouth 3 (three) times daily.   02/16/2015 at 2100  . calcium carbonate (TUMS - DOSED IN MG ELEMENTAL CALCIUM) 500 MG chewable tablet Chew 1 tablet by mouth daily.   02/16/2015 at 1800  . citalopram (CELEXA) 10 MG tablet Take 10 mg by mouth daily.    02/16/2015 at 0900  . cloNIDine (CATAPRES) 0.1 MG tablet Take 0.1 mg by mouth daily.   02/16/2015 at 1730  . Cranberry 425 MG CAPS Take 425 mg by mouth 2 (two) times daily.    02/16/2015 at 1600  . docusate sodium (COLACE) 100 MG capsule Take 100 mg by mouth 2 (two) times daily.   02/16/2015 at 1600  .  ipratropium-albuterol (DUONEB) 0.5-2.5 (3) MG/3ML SOLN Take 3 mLs by nebulization every 6 (six) hours as needed. Wheezing and sob   unknown at unknown  . meloxicam (MOBIC) 7.5 MG tablet Take 7.5 mg by mouth daily.   02/16/2015 at 0730  . metoprolol (LOPRESSOR) 50 MG tablet Take 50 mg by mouth 2 (two) times daily. Along with Metoprolol tartrate  for a total of =  bid   02/16/2015 at 1800  . metoprolol tartrate (LOPRESSOR) 25 MG tablet Take 12.5 mg by mouth 2 (two) times daily.   02/16/2015 at 1800  . Multiple Vitamin (MULITIVITAMIN WITH MINERALS) TABS Take 1 tablet by mouth daily.   02/16/2015 at 0900  . polyethylene glycol (MIRALAX / GLYCOLAX) packet Take 17 g by mouth daily.   02/16/2015 at 0730   . polyvinyl alcohol (LIQUIFILM TEARS) 1.4 % ophthalmic solution Place 1 drop into both eyes daily.    02/16/2015 at 0900  . traMADol (ULTRAM) 50 MG tablet Take 50 mg by mouth every 6 (six) hours as needed for pain.   unknown at unknown  . cephALEXin (KEFLEX) 500 MG capsule Take 1 capsule (500 mg total) by mouth 4 (four) times daily. (Patient not taking: Reported on 11/19/2014) 20 capsule 0 Not Taking at Unknown time  . sulfamethoxazole-trimethoprim (SEPTRA DS) 800-160 MG per tablet Take 1 tablet by mouth every 12 (twelve) hours. (Patient not taking: Reported on 02/16/2015) 20 tablet 0    Scheduled:  . antiseptic oral rinse  7 mL Mouth Rinse BID  . baclofen  10 mg Oral TID  . calcium carbonate  1 tablet Oral Daily  . cefTRIAXone (ROCEPHIN)  IV  1 g Intravenous Q24H  . citalopram  10 mg Oral Daily  . docusate sodium  100 mg Oral BID  . heparin  5,000 Units Subcutaneous 3 times per day  . meloxicam  7.5 mg Oral Daily  . metoprolol tartrate  12.5 mg Oral BID  . multivitamin with minerals  1 tablet Oral Daily  . polyethylene glycol  17 g Oral BID  . polyvinyl alcohol  1 drop Both Eyes Daily  . sodium chloride  3 mL Intravenous Q12H   Infusions:  . sodium chloride 75 mL/hr at 02/17/15 1843   Assessment: 64 yoM with hx of MS, quadraplegia, neurogenic bladder, suprapubic catheter placement c/o fever and abdominal pain.  Admitted on 5/5.  Rocephin per Rx for UTI.   5/6:  Blood culture growing +GPC         Pharmacy consulted to dose Vancomycin  Goal of Therapy:  Treat UTI Vancomycin trough = 15-20 mcg/ml  Plan:  Rocephin 1Gm IV q24h Vancomycin  IV q8h Check vancomycin trough level as needed F/u cultures as needed  Y[facility] Golla, Joselyn Glassman, PharmD 02/18/2015,12:30 AM

## 2015-02-18 NOTE — Clinical Social Work Note (Signed)
Clinical Social Work Assessment  Patient Details  Name: [name] MRN: [id number] Date of Birth: 20-Aug-1951  Date of referral:  02/18/15               Reason for consult:  Facility Placement                Permission sought to share information with:  Facility Medical sales representative, Family Supports Permission granted to share information::  Yes, Verbal Permission Granted  Name::        Agency::     Relationship::     Contact Information:     Housing/Transportation Living arrangements for the past 2 months:  Skilled Building surveyor of Information:  Patient, Facility, Adult Children Patient Interpreter Needed:  None Criminal Activity/Legal Involvement Pertinent to Current Situation/Hospitalization:    Significant Relationships:  Adult Children Lives with:  Facility Resident Do you feel safe going back to the place where you live?  Yes Need for family participation in patient care:     Care giving concerns:  CSW received consult that patient was admitted from [facility].    Social Worker assessment / plan:  CSW spoke with patient & daughter, [name] re: discharge plan. Patient is to return to [facility] at discharge.   Employment status:  Disabled (Comment on whether or not currently receiving Disability) Insurance information:  Medicare, Medicaid In [place] PT Recommendations:  Not assessed at this time Information / Referral to community resources:  Skilled Nursing Facility  Patient/Family's Response to care:  Patient & daughter are agreeable with plan to return to [facility] at discharge.   Patient/Family's Understanding of and Emotional Response to Diagnosis, Current Treatment, and Prognosis:  Patient's daughter was concerned about patient's progress, patient was unresponsive when admitted to the hospital but seems to have improved - patient shared that he's been living at [facility] and has been pleased with the care  he's been receiving there.   Emotional Assessment Appearance:  Appears older than stated age Attitude/Demeanor/Rapport:    Affect (typically observed):  Calm, Pleasant, Quiet Orientation:  Oriented to Self, Oriented to Place, Oriented to  Time, Oriented to Situation Alcohol / Substance use:    Psych involvement (Current and /or in the community):  No (Comment)  Discharge Needs  Concerns to be addressed:    Readmission within the last 30 days:    Current discharge risk:    Barriers to Discharge:      Timothy Repress, LCSW 02/18/2015, 3:20 PM

## 2015-02-18 NOTE — Progress Notes (Signed)
[facility] PROGRESS NOTE  [name] UXL:[id number] DOB: Oct 22, 1950 DOA: 02/16/2015 PCP: No primary care provider on file.  Assessment/Plan: #1 sepsis Likely secondary to UTI and possible bacteremia. Blood cultures 1 out of 2 growing gram-positive cocci in clusters. Patient currently afebrile with some clinical improvement. WBC trending down. Continue empiric IV vancomycin IV Rocephin. Follow.  #2 UTI Urine cultures pending. Continue IV Rocephin.  #3 bacteremia 1/2 blood cultures with gram-positive cocci in clusters is to preliminary reading. Patient currently on IV vancomycin. Follow for now.  #4 constipation Patient with significant bowel movement after enema yesterday. Continue current bowel regimen of MiraLAX twice a day and Colace. Follow.  #5 hypertension Continue current dose of metoprolol.  #6 multiple sclerosis. Patient is quadriplegic with a neurogenic bladder. Continue baclofen. The suction as needed.  #7 decubitus ulcers of the sacrum Will care team has been consulted.  #8 depression Stable. Continue home regimen of Celexa.  #9 prophylaxis Heparin for DVT prophylaxis  Code Status: Full Family Communication: Updated patient. No family at bedside. Disposition Plan: Back to nursing facility when medically stable.   Consultants:  None  Procedures:  CT abdomen and pelvis 02/17/2015  Chest x-ray 02/16/2015  Antibiotics:  IV Rocephin 02/18/2015  IV vancomycin 02/18/2015  HPI/Subjective: Patient with no complaints. Patient states he's feeling well. Patient denies any nausea or emesis. Patient denies any abdominal pain.  Objective: Filed Vitals:   02/18/15 0532  BP: 160/79  Pulse: 85  Temp: 98.4 F (36.9 C)  Resp: 16    Intake/Output Summary (Last 24 hours) at 02/18/15 1441 Last data filed at 02/18/15 0930  Gross per 24 hour  Intake 1241.25 ml  Output    775 ml  Net 466.25 ml   Filed Weights   02/17/15 0538 02/17/15 1129   Weight: 77.248 kg (170 lb 4.8 oz) 77.2 kg (170 lb 3.1 oz)    Exam:   General:  NAD  Cardiovascular: RRR  Respiratory: CTAB anterior lung fields.  Abdomen: Soft, nontender, nondistended, positive bowel sounds.  Musculoskeletal: No clubbing cyanosis or edema.  Data Reviewed: Basic Metabolic Panel:  Recent Labs Lab 02/17/15 0045 02/17/15 0843 02/18/15 0400  NA 139 139 141  K 4.1 4.2 3.8  CL 112* 113* 112*  CO2 23 22 22   GLUCOSE 111* 94 94  BUN 15 13 9   CREATININE 1.03 0.82 0.72  CALCIUM 8.4* 7.8* 8.3*  MG  --   --  1.7   Liver Function Tests:  Recent Labs Lab 02/17/15 0045  AST 35  ALT 40  ALKPHOS 60  BILITOT 0.6  PROT 6.5  ALBUMIN 3.3*    Recent Labs Lab 02/17/15 0045  LIPASE 18*   No results for input(s): AMMONIA in the last 168 hours. CBC:  Recent Labs Lab 02/16/15 2229 02/17/15 0843 02/18/15 0400  WBC 13.8* 12.8* 8.0  HGB 14.7 12.3* 13.1  HCT 44.3 38.2* 41.1  MCV 89.3 91.8 91.9  PLT 194 122* 130*   Cardiac Enzymes: No results for input(s): CKTOTAL, CKMB, CKMBINDEX, TROPONINI in the last 168 hours. BNP (last 3 results)  Recent Labs  02/16/15 2230  BNP 35.7    ProBNP (last 3 results) No results for input(s): PROBNP in the last 8760 hours.  CBG:  Recent Labs Lab 02/17/15 0804 02/18/15 0747  GLUCAP 78 82    Recent Results (from the past 240 hour(s))  Culture, blood (routine x 2)     Status: None (Preliminary result)   Collection Time: 02/16/15 10:22  PM  Result Value Ref Range Status   Specimen Description BLOOD LEFT HAND  Final   Special Requests BOTTLES DRAWN AEROBIC AND ANAEROBIC 3CC  Final   Culture   Final           BLOOD CULTURE RECEIVED NO GROWTH TO DATE CULTURE WILL BE HELD FOR 5 DAYS BEFORE ISSUING A FINAL NEGATIVE REPORT Performed at [facility]    Report Status PENDING  Incomplete  Culture, blood (routine x 2)     Status: None (Preliminary result)   Collection Time: 02/16/15 10:29 PM  Result Value  Ref Range Status   Specimen Description BLOOD RIGHT HAND  Final   Special Requests BOTTLES DRAWN AEROBIC AND ANAEROBIC 5CC  Final   Culture   Final    GRAM POSITIVE COCCI IN CLUSTERS Note: Gram Stain Report Called to,Read Back By and Verified With: PEACE DORMON ON 5.5.16 AT 10:15P BY WILEJ Performed at [facility]    Report Status PENDING  Incomplete  Urine culture     Status: None (Preliminary result)   Collection Time: 02/17/15  2:14 AM  Result Value Ref Range Status   Specimen Description URINE, SUPRAPUBIC  Final   Special Requests NONE  Final   Culture   Final    Culture reincubated for better growth Performed at [facility]    Report Status PENDING  Incomplete  MRSA PCR Screening     Status: None   Collection Time: 02/17/15  6:19 AM  Result Value Ref Range Status   MRSA by PCR NEGATIVE NEGATIVE Final    Comment:        The GeneXpert MRSA Assay (FDA approved for NASAL specimens only), is one component of a comprehensive MRSA colonization surveillance program. It is not intended to diagnose MRSA infection nor to guide or monitor treatment for MRSA infections.      Studies: Ct Abdomen Pelvis W Contrast  02/17/2015   CLINICAL DATA:  Abdominal distention and pain  EXAM: CT ABDOMEN AND PELVIS WITH CONTRAST  TECHNIQUE: Multidetector CT imaging of the abdomen and pelvis was performed using the standard protocol following bolus administration of intravenous contrast.  CONTRAST:  OMNIPAQUE IOHEXOL 300 MG/ML  SOLN  COMPARISON:  Radiographs 12/22/2012  FINDINGS: There is a large volume of stool distending the rectum. This could represent fecal impaction. Generous stool volume is present throughout the remainder of the colon as well. There is no bowel obstruction. Small bowel is unremarkable. Stomach is unremarkable.  There are unremarkable appearances of the liver, spleen, pancreas, adrenals and kidneys.  The abdominal aorta is normal in caliber.  There is a  suprapubic urinary bladder catheter.  There is a fat containing left inguinal hernia.  There is linear scarring or atelectasis in the left lung base.  No significant skeletal lesions are evident.  IMPRESSION: *Generous colonic stool volume. Prominent distention of the rectum with stool, suggesting fecal impaction. *Fat containing left inguinal hernia   Electronically Signed   By: Ellery Plunk M.D.   On: 02/17/2015 02:26   Dg Chest Port 1 View  02/16/2015   CLINICAL DATA:  Respiratory distress, shortness of breath, unresponsiveness.  EXAM: PORTABLE CHEST - 1 VIEW  COMPARISON:  Chest radiograph December 22, 2012  FINDINGS: The cardiac silhouette appears mildly enlarged, similar. Mediastinal silhouette is unremarkable for this low inspiratory portable examination with crowded vascular markings. Similar biapical pleural thickening without pleural effusion or focal consolidation. No pneumothorax. Soft tissue planes and included osseous structures  are nonsuspicious.  IMPRESSION: Stable mild cardiomegaly, no acute pulmonary process.   Electronically Signed   By: Awilda Metro   On: 02/16/2015 22:50    Scheduled Meds: . antiseptic oral rinse  7 mL Mouth Rinse BID  . baclofen  10 mg Oral TID  . calcium carbonate  1 tablet Oral Daily  . cefTRIAXone (ROCEPHIN)  IV  1 g Intravenous Q24H  . citalopram  10 mg Oral Daily  . docusate sodium  100 mg Oral BID  . heparin  5,000 Units Subcutaneous 3 times per day  . meloxicam  7.5 mg Oral Daily  . metoprolol tartrate  12.5 mg Oral BID  . multivitamin with minerals  1 tablet Oral Daily  . polyethylene glycol  17 g Oral BID  . polyvinyl alcohol  1 drop Both Eyes Daily  . sodium chloride  3 mL Intravenous Q12H  . vancomycin  1,000 mg Intravenous Q12H   Continuous Infusions: . sodium chloride 75 mL/hr (02/18/15 1000)    Principal Problem:   Sepsis Active Problems:   UTI (lower urinary tract infection)   Bacteremia due to Gram-positive bacteria   Multiple  sclerosis   HYPERTENSION, BENIGN ESSENTIAL   Neurogenic bladder   Decubitus ulcer of lower back   Fever   Constipation   Pyrexia    Time spent: 35 mins    [facility] MD [facility] Pager [phone]. If 7PM-7AM, please contact night-coverage at [email or web address], password [facility] 02/18/2015, 2:41 PM  LOS: 1 day

## 2015-02-19 LAB — BASIC METABOLIC PANEL
ANION GAP: 7 (ref 5–15)
BUN: 7 mg/dL (ref 6–20)
CO2: 25 mmol/L (ref 22–32)
Calcium: 8.7 mg/dL — ABNORMAL LOW (ref 8.9–10.3)
Chloride: 109 mmol/L (ref 101–111)
Creatinine, Ser: 0.72 mg/dL (ref 0.61–1.24)
GFR calc non Af Amer: >60 mL/min (ref >60)
Glucose, Bld: 94 mg/dL (ref 70–99)
POTASSIUM: 4 mmol/L (ref 3.5–5.1)
Sodium: 141 mmol/L (ref 135–145)

## 2015-02-19 LAB — CBC
HCT: 41.7 % (ref 39.0–52.0)
Hemoglobin: 14.1 g/dL (ref 13.0–17.0)
MCH: 30.8 pg (ref 26.0–34.0)
MCHC: 33.8 g/dL (ref 30.0–36.0)
MCV: 91 fL (ref 78.0–100.0)
Platelets: 152 10*3/uL (ref 150–400)
RBC: 4.58 MIL/uL (ref 4.22–5.81)
RDW: 14.9 % (ref 11.5–15.5)
WBC: 6.4 10*3/uL (ref 4.0–10.5)

## 2015-02-19 LAB — CULTURE, BLOOD (ROUTINE X 2)

## 2015-02-19 LAB — MAGNESIUM: MAGNESIUM: 2.4 mg/dL (ref 1.7–2.4)

## 2015-02-19 LAB — GLUCOSE, CAPILLARY: GLUCOSE-CAPILLARY: 83 mg/dL (ref 70–99)

## 2015-02-19 NOTE — Progress Notes (Signed)
[facility] PROGRESS NOTE  [name] ZOX:[id number] DOB: 1951/01/17 DOA: 02/16/2015 PCP: No primary care provider on file.  Assessment/Plan: #1 sepsis Likely secondary to UTI. Blood cultures 1/2 growing coagulase negative staph species. Likely a contaminant. Patient currently afebrile with clinical improvement. WBC trending down. Continue empiric IV Rocephin. DC IV vancomycin. Follow.  #2 UTI Urine cultures preliminary readings with greater than 100,000 gram-negative rods. Continue IV Rocephin.  #3 coagulase-negative staph bacteremia 1/2 blood cultures with coagulase-negative staph which is likely a contaminant. Will discontinue IV vancomycin.  #4 constipation Patient with significant bowel movement after enema yesterday. Continue current bowel regimen of MiraLAX twice a day and Colace. Follow.  #5 hypertension Continue  metoprolol.  #6 multiple sclerosis. Patient is quadriplegic with a neurogenic bladder. Continue baclofen. The suction as needed.  #7 decubitus ulcers of the sacrum Wound care team has been consulted.  #8 depression Stable. Continue home regimen of Celexa.  #9 prophylaxis Heparin for DVT prophylaxis  Code Status: Full Family Communication: Updated patient. No family at bedside. Disposition Plan: Back to nursing facility when medically stable.   Consultants:  None  Procedures:  CT abdomen and pelvis 02/17/2015  Chest x-ray 02/16/2015  Antibiotics:  IV Rocephin 02/18/2015  IV vancomycin 02/18/2015>>>>02/19/15  HPI/Subjective: Patient with no complaints. Patient states he's feeling well. Patient denies any nausea or emesis. Patient denies any abdominal pain.  Objective: Filed Vitals:   02/19/15 1334  BP: 127/95  Pulse: 71  Temp: 98.4 F (36.9 C)  Resp: 18    Intake/Output Summary (Last 24 hours) at 02/19/15 1448 Last data filed at 02/19/15 0500  Gross per 24 hour  Intake 1367.92 ml  Output   1800 ml  Net -432.08 ml    Filed Weights   02/17/15 0538 02/17/15 1129  Weight: 77.248 kg (170 lb 4.8 oz) 77.2 kg (170 lb 3.1 oz)    Exam:   General:  NAD  Cardiovascular: RRR  Respiratory: CTAB anterior lung fields.  Abdomen: Soft, nontender, nondistended, positive bowel sounds.  Musculoskeletal: No clubbing cyanosis or edema.  Data Reviewed: Basic Metabolic Panel:  Recent Labs Lab 02/17/15 0045 02/17/15 0843 02/18/15 0400 02/19/15 0555  NA 139 139 141 141  K 4.1 4.2 3.8 4.0  CL 112* 113* 112* 109  CO2 GLUCOSE 111* 94 94 94  BUN CREATININE 1.03 0.82 0.72 0.72  CALCIUM 8.4* 7.8* 8.3* 8.7*  MG  --   --  1.7 2.4   Liver Function Tests:  Recent Labs Lab 02/17/15 0045  AST 35  ALT 40  ALKPHOS 60  BILITOT 0.6  PROT 6.5  ALBUMIN 3.3*    Recent Labs Lab 02/17/15 0045  LIPASE 18*   No results for input(s): AMMONIA in the last 168 hours. CBC:  Recent Labs Lab 02/16/15 2229 02/17/15 0843 02/18/15 0400 02/19/15 0555  WBC 13.8* 12.8* 8.0 6.4  HGB 14.7 12.3* 13.1 14.1  HCT 44.3 38.2* 41.1 41.7  MCV 89.3 91.8 91.9 91.0  PLT 194 122* 130* 152   Cardiac Enzymes: No results for input(s): CKTOTAL, CKMB, CKMBINDEX, TROPONINI in the last 168 hours. BNP (last 3 results)  Recent Labs  02/16/15 2230  BNP 35.7    ProBNP (last 3 results) No results for input(s): PROBNP in the last 8760 hours.  CBG:  Recent Labs Lab 02/17/15 0804 02/18/15 0747 02/19/15 0755  GLUCAP 78 82 83    Recent Results (from the past 240 hour(s))  Culture, blood (routine x 2)     Status: None (Preliminary result)   Collection Time: 02/16/15 10:22 PM  Result Value Ref Range Status   Specimen Description BLOOD LEFT HAND  Final   Special Requests BOTTLES DRAWN AEROBIC AND ANAEROBIC 3CC  Final   Culture   Final           BLOOD CULTURE RECEIVED NO GROWTH TO DATE CULTURE WILL BE HELD FOR 5 DAYS BEFORE ISSUING A FINAL NEGATIVE REPORT Performed at [facility]     Report Status PENDING  Incomplete  Culture, blood (routine x 2)     Status: None   Collection Time: 02/16/15 10:29 PM  Result Value Ref Range Status   Specimen Description BLOOD RIGHT HAND  Final   Special Requests BOTTLES DRAWN AEROBIC AND ANAEROBIC 5CC  Final   Culture   Final    STAPHYLOCOCCUS SPECIES (COAGULASE NEGATIVE) Note: THE SIGNIFICANCE OF ISOLATING THIS ORGANISM FROM A SINGLE SET OF BLOOD CULTURES WHEN MULTIPLE SETS ARE DRAWN IS UNCERTAIN. PLEASE NOTIFY THE MICROBIOLOGY DEPARTMENT WITHIN ONE WEEK IF SPECIATION AND SENSITIVITIES ARE REQUIRED. Note: Gram Stain Report Called to,Read Back By and Verified With: PEACE DORMON ON 5.5.16 AT 10:15P BY WILEJ Performed at [facility]    Report Status 02/19/2015 FINAL  Final  Urine culture     Status: None (Preliminary result)   Collection Time: 02/17/15  2:14 AM  Result Value Ref Range Status   Specimen Description URINE, SUPRAPUBIC  Final   Special Requests NONE  Final   Colony Count   Final    >=100,000 COLONIES/ML Performed at [facility]    Culture   Final    GRAM NEGATIVE RODS Performed at [facility]    Report Status PENDING  Incomplete  MRSA PCR Screening     Status: None   Collection Time: 02/17/15  6:19 AM  Result Value Ref Range Status   MRSA by PCR NEGATIVE NEGATIVE Final    Comment:        The GeneXpert MRSA Assay (FDA approved for NASAL specimens only), is one component of a comprehensive MRSA colonization surveillance program. It is not intended to diagnose MRSA infection nor to guide or monitor treatment for MRSA infections.      Studies: No results found.  Scheduled Meds: . antiseptic oral rinse  7 mL Mouth Rinse BID  . baclofen  10 mg Oral TID  . calcium carbonate  1 tablet Oral Daily  . cefTRIAXone (ROCEPHIN)  IV  1 g Intravenous Q24H  . citalopram  10 mg Oral Daily  . docusate sodium  100 mg Oral BID  . heparin  5,000 Units Subcutaneous 3 times per day  . meloxicam   7.5 mg Oral Daily  . metoprolol tartrate  25 mg Oral BID  . multivitamin with minerals  1 tablet Oral Daily  . polyethylene glycol  17 g Oral BID  . polyvinyl alcohol  1 drop Both Eyes Daily  . sodium chloride  3 mL Intravenous Q12H  . vancomycin  1,000 mg Intravenous Q12H   Continuous Infusions: . sodium chloride 50 mL/hr at 02/19/15 0500    Principal Problem:   Sepsis Active Problems:   UTI (lower urinary tract infection)   Bacteremia due to Gram-positive bacteria   Multiple sclerosis   HYPERTENSION, BENIGN ESSENTIAL   Neurogenic bladder   Decubitus ulcer of lower back   Fever   Constipation   Pyrexia    Time spent: 32  mins    [facility] MD [facility] Pager [phone]. If 7PM-7AM, please contact night-coverage at [email or web address], password [facility] 02/19/2015, 2:48 PM  LOS: 2 days

## 2015-02-20 LAB — BASIC METABOLIC PANEL
Anion gap: 6 (ref 5–15)
BUN: 7 mg/dL (ref 6–20)
CO2: 21 mmol/L — AB (ref 22–32)
Calcium: 8.7 mg/dL — ABNORMAL LOW (ref 8.9–10.3)
Chloride: 112 mmol/L — ABNORMAL HIGH (ref 101–111)
Creatinine, Ser: 0.6 mg/dL — ABNORMAL LOW (ref 0.61–1.24)
GFR calc Af Amer: >60 mL/min (ref >60)
GFR calc non Af Amer: >60 mL/min (ref >60)
GLUCOSE: 83 mg/dL (ref 70–99)
Potassium: 5.3 mmol/L — ABNORMAL HIGH (ref 3.5–5.1)
Sodium: 139 mmol/L (ref 135–145)

## 2015-02-20 LAB — URINE CULTURE

## 2015-02-20 LAB — CBC
HCT: 44.6 % (ref 39.0–52.0)
Hemoglobin: 14.7 g/dL (ref 13.0–17.0)
MCH: 29.3 pg (ref 26.0–34.0)
MCHC: 33 g/dL (ref 30.0–36.0)
MCV: 89 fL (ref 78.0–100.0)
Platelets: 157 10*3/uL (ref 150–400)
RBC: 5.01 MIL/uL (ref 4.22–5.81)
RDW: 14.5 % (ref 11.5–15.5)
WBC: 6.7 10*3/uL (ref 4.0–10.5)

## 2015-02-20 LAB — GLUCOSE, CAPILLARY: GLUCOSE-CAPILLARY: 86 mg/dL (ref 70–99)

## 2015-02-20 MED ORDER — METOPROLOL TARTRATE 50 MG PO TABS
62.5000 mg | ORAL_TABLET | Freq: Two times a day (BID) | ORAL | Status: DC
  Administered 2015-02-20 – 2015-02-21 (×2): 62.5 mg via ORAL
  Filled 2015-02-20 (×4): qty 1

## 2015-02-20 MED ORDER — CEFUROXIME AXETIL 250 MG/5ML PO SUSR: 250.0000 mg | Freq: Two times a day (BID) | ORAL | Status: DC

## 2015-02-20 MED ORDER — CEFUROXIME AXETIL 250 MG PO TABS
250.0000 mg | ORAL_TABLET | Freq: Two times a day (BID) | ORAL | Status: DC
  Filled 2015-02-20: qty 1

## 2015-02-20 MED ORDER — CEFPODOXIME PROXETIL 200 MG PO TABS
200.0000 mg | ORAL_TABLET | Freq: Two times a day (BID) | ORAL | Status: DC
  Administered 2015-02-20 – 2015-02-21 (×2): 200 mg via ORAL
  Filled 2015-02-20 (×4): qty 1

## 2015-02-20 NOTE — Progress Notes (Signed)
ANTIBIOTIC CONSULT NOTE - Follow up  Pharmacy Consult for Rocephin Indication: UTI  No Known Allergies  Patient Measurements: Height:  (180.3 cm) Weight: 170 lb 3.1 oz (77.2 kg) IBW/kg (Calculated) : 75.3   Vital Signs: Temp: 98.9 F (37.2 C) (05/08 0451) Temp Source: Oral (05/08 0451) BP: 162/84 mmHg (05/08 0917) Pulse Rate: 77 (05/08 0917) Intake/Output from previous day: 05/07 0701 - 05/08 0700 In: 600 [I.V.:300; IV Piggyback:300] Out: 1850 [Urine:1850] Intake/Output from this shift: Total I/O In: 120 [P.O.:120] Out: -   Labs:  Recent Labs  02/18/15 0400 02/19/15 0555 02/20/15 0701  WBC 8.0 6.4 6.7  HGB 13.1 14.1 14.7  PLT 130* 152 157  CREATININE 0.72 0.72 0.60*   Estimated Creatinine Clearance: 102 mL/min (by C-G formula based on Cr of 0.6). No results for input(s): VANCOTROUGH, VANCOPEAK, VANCORANDOM, GENTTROUGH, GENTPEAK, GENTRANDOM, TOBRATROUGH, TOBRAPEAK, TOBRARND, AMIKACINPEAK, AMIKACINTROU, AMIKACIN in the last 72 hours.   Microbiology: Recent Results (from the past 720 hour(s))  Culture, blood (routine x 2)     Status: None (Preliminary result)   Collection Time: 02/16/15 10:22 PM  Result Value Ref Range Status   Specimen Description BLOOD LEFT HAND  Final   Special Requests BOTTLES DRAWN AEROBIC AND ANAEROBIC 3CC  Final   Culture   Final           BLOOD CULTURE RECEIVED NO GROWTH TO DATE CULTURE WILL BE HELD FOR 5 DAYS BEFORE ISSUING A FINAL NEGATIVE REPORT Performed at [facility]    Report Status PENDING  Incomplete  Culture, blood (routine x 2)     Status: None   Collection Time: 02/16/15 10:29 PM  Result Value Ref Range Status   Specimen Description BLOOD RIGHT HAND  Final   Special Requests BOTTLES DRAWN AEROBIC AND ANAEROBIC 5CC  Final   Culture   Final    STAPHYLOCOCCUS SPECIES (COAGULASE NEGATIVE) Note: THE SIGNIFICANCE OF ISOLATING THIS ORGANISM FROM A SINGLE SET OF BLOOD CULTURES WHEN MULTIPLE SETS ARE DRAWN IS  UNCERTAIN. PLEASE NOTIFY THE MICROBIOLOGY DEPARTMENT WITHIN ONE WEEK IF SPECIATION AND SENSITIVITIES ARE REQUIRED. Note: Gram Stain Report Called to,Read Back By and Verified With: PEACE DORMON ON 5.5.16 AT 10:15P BY WILEJ Performed at [facility]    Report Status 02/19/2015 FINAL  Final  Urine culture     Status: None (Preliminary result)   Collection Time: 02/17/15  2:14 AM  Result Value Ref Range Status   Specimen Description URINE, SUPRAPUBIC  Final   Special Requests NONE  Final   Colony Count   Final    >=100,000 COLONIES/ML Performed at [facility]    Culture   Final    GRAM NEGATIVE RODS Performed at [facility]    Report Status PENDING  Incomplete  MRSA PCR Screening     Status: None   Collection Time: 02/17/15  6:19 AM  Result Value Ref Range Status   MRSA by PCR NEGATIVE NEGATIVE Final    Comment:        The GeneXpert MRSA Assay (FDA approved for NASAL specimens only), is one component of a comprehensive MRSA colonization surveillance program. It is not intended to diagnose MRSA infection nor to guide or monitor treatment for MRSA infections.    Assessment: 59 yoM with hx of MS, quadraplegia, neurogenic bladder, suprapubic catheter placement c/o fever and abdominal pain.  Rocephin per Rx for UTI.   Afebrile, WBCs wnl, SCr wnl, 100CG.  5/5 >> Rocephin >> 5/6 >> Vanc >>  5/7  5/4 blood x2: 1/2 CoNS 5/5 blood x2: canceled  5/5 urine (suprapubic): GNR   Goal of Therapy:  Appropriate antibiotic dosing for renal function; eradication of infection  Plan:  Cont Rocephin 1g IV q24h. F/u GNR in urine.   Charolotte Eke, PharmD, pager [phone]. 02/20/2015,12:35 PM.

## 2015-02-20 NOTE — Progress Notes (Signed)
[facility] PROGRESS NOTE  [name] XBM:[id number] DOB: May 13, 1951 DOA: 02/16/2015 PCP: No primary care provider on file.  Assessment/Plan: #1 sepsis Likely secondary to UTI. Blood cultures 1/2 growing coagulase negative staph species. Likely a contaminant. Patient currently afebrile with clinical improvement. WBC trending down. Change IV Rocephin to oral ceftin. Follow.  #2 Providencia stuartii/EColi UTI Urine cultures preliminary readings with greater than 100,000 gram-negative rods. Change IV Rocephin to ceftin.  #3 coagulase-negative staph bacteremia 1/2 blood cultures with coagulase-negative staph which is likely a contaminant. D/C IV vancomycin.  #4 constipation Continue current bowel regimen of MiraLAX twice a day and Colace. Follow.  #5 hypertension Increase metoprolol to home dose.  #6 multiple sclerosis. Patient is quadriplegic with a neurogenic bladder. Continue baclofen.  Suction as needed.  #7 decubitus ulcers of the sacrum Wound care team has been consulted.  #8 depression Stable. Continue Celexa.  #9 prophylaxis Heparin for DVT prophylaxis  Code Status: Full Family Communication: Updated patient. No family at bedside. Disposition Plan: Back to nursing facility when medically stable, hopefully in 24-48 hrs.   Consultants:  None  Procedures:  CT abdomen and pelvis 02/17/2015  Chest x-ray 02/16/2015  Antibiotics:  IV Rocephin 02/18/2015>>>>>02/20/15  IV vancomycin 02/18/2015>>>>02/19/15  Oral ceftin 02/20/15  HPI/Subjective: Patient denies any nausea or emesis. Patient denies any abdominal pain.Patient tolerating current diet.  Objective: Filed Vitals:   02/20/15 0917  BP: 162/84  Pulse: 77  Temp:   Resp:     Intake/Output Summary (Last 24 hours) at 02/20/15 1358 Last data filed at 02/20/15 1007  Gross per 24 hour  Intake    520 ml  Output   1850 ml  Net  -1330 ml   Filed Weights   02/17/15 0538 02/17/15 1129  Weight:  77.248 kg (170 lb 4.8 oz) 77.2 kg (170 lb 3.1 oz)    Exam:   General:  NAD  Cardiovascular: RRR  Respiratory: CTAB anterior lung fields.  Abdomen: Soft, nontender, nondistended, positive bowel sounds.  Musculoskeletal: No clubbing cyanosis or edema.  Data Reviewed: Basic Metabolic Panel:  Recent Labs Lab 02/17/15 0045 02/17/15 0843 02/18/15 0400 02/19/15 0555 02/20/15 0701  NA 139 139 141 141 139  K 4.1 4.2 3.8 4.0 5.3*  CL 112* 113* 112* 109 112*  CO2 23 22 22 25  21*  GLUCOSE 111* 94 94 94 83  BUN 15 13 9 7 7   CREATININE 1.03 0.82 0.72 0.72 0.60*  CALCIUM 8.4* 7.8* 8.3* 8.7* 8.7*  MG  --   --  1.7 2.4  --    Liver Function Tests:  Recent Labs Lab 02/17/15 0045  AST 35  ALT 40  ALKPHOS 60  BILITOT 0.6  PROT 6.5  ALBUMIN 3.3*    Recent Labs Lab 02/17/15 0045  LIPASE 18*   No results for input(s): AMMONIA in the last 168 hours. CBC:  Recent Labs Lab 02/16/15 2229 02/17/15 0843 02/18/15 0400 02/19/15 0555 02/20/15 0701  WBC 13.8* 12.8* 8.0 6.4 6.7  HGB 14.7 12.3* 13.1 14.1 14.7  HCT 44.3 38.2* 41.1 41.7 44.6  MCV 89.3 91.8 91.9 91.0 89.0  PLT 194 122* 130* 152 157   Cardiac Enzymes: No results for input(s): CKTOTAL, CKMB, CKMBINDEX, TROPONINI in the last 168 hours. BNP (last 3 results)  Recent Labs  02/16/15 2230  BNP 35.7    ProBNP (last 3 results) No results for input(s): PROBNP in the last 8760 hours.  CBG:  Recent Labs Lab 02/17/15 0804 02/18/15 0747 02/19/15 0272  02/20/15 0750  GLUCAP 78 82 83 86    Recent Results (from the past 240 hour(s))  Culture, blood (routine x 2)     Status: None (Preliminary result)   Collection Time: 02/16/15 10:22 PM  Result Value Ref Range Status   Specimen Description BLOOD LEFT HAND  Final   Special Requests BOTTLES DRAWN AEROBIC AND ANAEROBIC 3CC  Final   Culture   Final           BLOOD CULTURE RECEIVED NO GROWTH TO DATE CULTURE WILL BE HELD FOR 5 DAYS BEFORE ISSUING A FINAL NEGATIVE  REPORT Performed at [facility]    Report Status PENDING  Incomplete  Culture, blood (routine x 2)     Status: None   Collection Time: 02/16/15 10:29 PM  Result Value Ref Range Status   Specimen Description BLOOD RIGHT HAND  Final   Special Requests BOTTLES DRAWN AEROBIC AND ANAEROBIC 5CC  Final   Culture   Final    STAPHYLOCOCCUS SPECIES (COAGULASE NEGATIVE) Note: THE SIGNIFICANCE OF ISOLATING THIS ORGANISM FROM A SINGLE SET OF BLOOD CULTURES WHEN MULTIPLE SETS ARE DRAWN IS UNCERTAIN. PLEASE NOTIFY THE MICROBIOLOGY DEPARTMENT WITHIN ONE WEEK IF SPECIATION AND SENSITIVITIES ARE REQUIRED. Note: Gram Stain Report Called to,Read Back By and Verified With: PEACE DORMON ON 5.5.16 AT 10:15P BY WILEJ Performed at [facility]    Report Status 02/19/2015 FINAL  Final  Urine culture     Status: None (Preliminary result)   Collection Time: 02/17/15  2:14 AM  Result Value Ref Range Status   Specimen Description URINE, SUPRAPUBIC  Final   Special Requests NONE  Final   Colony Count   Final    >=100,000 COLONIES/ML Performed at [facility]    Culture   Final    GRAM NEGATIVE RODS Performed at [facility]    Report Status PENDING  Incomplete  MRSA PCR Screening     Status: None   Collection Time: 02/17/15  6:19 AM  Result Value Ref Range Status   MRSA by PCR NEGATIVE NEGATIVE Final    Comment:        The GeneXpert MRSA Assay (FDA approved for NASAL specimens only), is one component of a comprehensive MRSA colonization surveillance program. It is not intended to diagnose MRSA infection nor to guide or monitor treatment for MRSA infections.      Studies: No results found.  Scheduled Meds: . antiseptic oral rinse  7 mL Mouth Rinse BID  . baclofen  10 mg Oral TID  . calcium carbonate  1 tablet Oral Daily  . cefTRIAXone (ROCEPHIN)  IV  1 g Intravenous Q24H  . citalopram  10 mg Oral Daily  . docusate sodium  100 mg Oral BID  . heparin  5,000  Units Subcutaneous 3 times per day  . meloxicam  7.5 mg Oral Daily  . metoprolol tartrate  25 mg Oral BID  . multivitamin with minerals  1 tablet Oral Daily  . polyethylene glycol  17 g Oral BID  . polyvinyl alcohol  1 drop Both Eyes Daily  . sodium chloride  3 mL Intravenous Q12H   Continuous Infusions:    Principal Problem:   Sepsis Active Problems:   UTI (lower urinary tract infection)   Bacteremia due to Gram-positive bacteria   Multiple sclerosis   HYPERTENSION, BENIGN ESSENTIAL   Neurogenic bladder   Decubitus ulcer of lower back   Fever   Constipation   Pyrexia  Time spent: 35 mins    [facility] MD [facility] Pager [phone]. If 7PM-7AM, please contact night-coverage at [email or web address], password [facility] 02/20/2015, 1:58 PM  LOS: 3 days

## 2015-02-20 NOTE — Progress Notes (Signed)
Dear Doctor: Janee Morn,  This patient has been identified as a candidate for Midline  for the following reason (s): poor veins/poor circulatory system (CHF, COPD, emphysema, diabetes, steroid use, IV drug abuse, etc.) If you agree, please write an order for the indicated device. For any questions contact the Vascular Access Team at [phone] if no answer, please leave a message.  Best iv access i could find was the middle finger with a 24g x 3/4 inch cath.  If we loose this access, I see nothing else to attempt.  Thank you for supporting the early vascular access assessment program.

## 2015-02-21 DIAGNOSIS — A4151 Sepsis due to Escherichia coli [E. coli]: Secondary | ICD-10-CM

## 2015-02-21 LAB — BASIC METABOLIC PANEL
ANION GAP: 10 (ref 5–15)
BUN: 11 mg/dL (ref 6–20)
CHLORIDE: 104 mmol/L (ref 101–111)
CO2: 25 mmol/L (ref 22–32)
Calcium: 9.2 mg/dL (ref 8.9–10.3)
Creatinine, Ser: 0.77 mg/dL (ref 0.61–1.24)
GFR calc non Af Amer: >60 mL/min (ref >60)
Glucose, Bld: 86 mg/dL (ref 70–99)
POTASSIUM: 4.2 mmol/L (ref 3.5–5.1)
Sodium: 139 mmol/L (ref 135–145)

## 2015-02-21 LAB — CBC
HEMATOCRIT: 41.6 % (ref 39.0–52.0)
Hemoglobin: 13.5 g/dL (ref 13.0–17.0)
MCH: 29.3 pg (ref 26.0–34.0)
MCHC: 32.5 g/dL (ref 30.0–36.0)
MCV: 90.2 fL (ref 78.0–100.0)
Platelets: 171 10*3/uL (ref 150–400)
RBC: 4.61 MIL/uL (ref 4.22–5.81)
RDW: 14.3 % (ref 11.5–15.5)
WBC: 9.7 10*3/uL (ref 4.0–10.5)

## 2015-02-21 LAB — GLUCOSE, CAPILLARY: Glucose-Capillary: 86 mg/dL (ref 70–99)

## 2015-02-21 MED ORDER — TRAMADOL HCL 50 MG PO TABS: 50.0000 mg | ORAL_TABLET | Freq: Four times a day (QID) | ORAL | Status: AC | PRN

## 2015-02-21 MED ORDER — CEFPODOXIME PROXETIL 200 MG PO TABS: 200.0000 mg | ORAL_TABLET | Freq: Two times a day (BID) | ORAL | Status: AC

## 2015-02-21 NOTE — Progress Notes (Signed)
Patient is set to discharge back to [facility] today. Patient & daughter, [name] aware. Discharge packet given to RN, Amil Amen. [facility] called for transport.     Lincoln Maxin, LCSW [facility] Clinical Social Worker cell #: [phone]

## 2015-02-21 NOTE — Discharge Summary (Signed)
Physician Discharge Summary  [name] ZOX:[id number] DOB: [birth year] DOA: 02/16/2015  PCP: No primary care provider on file.  Admit date: 02/16/2015 Discharge date: 02/21/2015  Time spent: 65 minutes  Recommendations for Outpatient Follow-up:  1. Patient will be discharged to skilled nursing facility and will follow up with MD at SNF. Patient will need a BMET in 1 week.  Discharge Diagnoses:  Principal Problem:   Sepsis Active Problems:   UTI (lower urinary tract infection)   Bacteremia due to Gram-positive bacteria   Multiple sclerosis   HYPERTENSION, BENIGN ESSENTIAL   Neurogenic bladder   Decubitus ulcer of lower back   Fever   Constipation   Pyrexia   Discharge Condition: stable and improved  Diet recommendation: regular  Filed Weights   02/17/15 0538 02/17/15 1129  Weight: 77.248 kg (170 lb 4.8 oz) 77.2 kg (170 lb 3.1 oz)    History of present illness:  Per Dr [name] [name] is a 64 y.o. male with PMH of multiple sclerosis, quadriplegia, neurogenic bladder, suprapubic catheter placement, depression, recurrent UTI, who presented with fever and abdominal pain.  Because of the severe sequela from multiple sclerosis, patient cannot provide history in detail. The history is obtained from ED and EMS report. Patient is from nursing home. He was brought to the emergency room by EMS with complaint of fever. He had abdominal distention and abdominal pain to palpation. He could answer a few questions when admitting physician saw patient in the emergency room. He denied dysuria, cough, chest pain and shortness of breath.  In ED, patient was found to have negative lipase, temperature 101.3, tachycardia, negative chest x-ray, WBC 13.8, electrolytes okay, lactate 1.63. Urinalysis showed small amount of leukocytes. CT abdomen/pelvis showed fecal impact and fat containing left inguinal hernia. Patient was admitted to inpatient for further evaluation and treatment.   Hospital  Course:  #1 sepsis Likely secondary to UTI. Blood cultures 1/2 growing coagulase negative staph species. Likely a contaminant. Patient was initially placed empirically on IV Rocephin and pancultured. Urine cultures grew out greater than 100,000 colonies of Escherichia coli and providencia stuartii. Patient improved clinically. Patient remained afebrile. WBC trended down and normalized. Patient was subsequently transitioned to oral Vantin and will be discharged on 5 more days to complete a course of antibiotic therapy.  #2 Providencia stuartii/EColi UTI Patient had presented with a fever, abdominal pain and leukocytosis. Urinalysis which was done was concerning for UTI and a such patient was placed on IV Rocephin. Foley catheter was changed in the emergency room on admission. Urine cultures came back with greater than 100,000 gram-negative rods that ended up growing out Escherichia coli and providencia stuartii. Patient subsequently remained afebrile throughout the hospitalization leukocytosis trended down and had normalized by day of discharge. Patient was transitioned to oral vantin which he tolerated. Patient will be discharged on 5 more days of oral Vantin to complete a course of antibiotic therapy.  #3 coagulase-negative staph bacteremia 1/2 blood cultures with coagulase-negative staph which is likely a contaminant. Patient was initially placed on IV vancomycin while cultures were pending. Once culture results are back IV vancomycin was discontinued.  #4 constipation Patient noted to be constipated per CT scan. Patient was given an enema with good bowel movement. Patient was placed on a bowel regimen of MiraLAX twice daily as well as Colace. Patient be discharged on this regimen. Outpatient follow-up.  #5 hypertension On admission patient was placed on half his home dose of metoprolol. Patient was hydrated with  IV fluids. Patient improved clinically and metoprolol dose was increased back to his  home regimen. Patient be discharged back on his home regimen of antihypertensive medications.  #6 multiple sclerosis. Patient is quadriplegic with a neurogenic bladder. Continued on baclofen.   #7 decubitus ulcers of the sacrum Wound care team was consulted.  #8 depression Stable. Continued on home regimen of Celexa.  Procedures:  CT abdomen and pelvis 02/17/2015  Chest x-ray 02/16/2015  Consultations:  None  Discharge Exam: Filed Vitals:   02/21/15 0435  BP: 136/84  Pulse: 75  Temp: 97.6 F (36.4 C)  Resp: 16    General: NAD Cardiovascular: RRR Respiratory: CTAB anterior lung fields  Discharge Instructions   Discharge Instructions    Diet general    Complete by:  As directed      Discharge instructions    Complete by:  As directed   Follow up with MD at SNF.     Increase activity slowly    Complete by:  As directed           Current Discharge Medication List    START taking these medications   Details  cefpodoxime (VANTIN) 200 MG tablet Take 1 tablet (200 mg total) by mouth every 12 (twelve) hours. Take for 5 days then stop. Qty: 10 tablet, Refills: 0      CONTINUE these medications which have CHANGED   Details  traMADol (ULTRAM) 50 MG tablet Take 1 tablet (50 mg total) by mouth every 6 (six) hours as needed. Qty: 20 tablet, Refills: 0      CONTINUE these medications which have NOT CHANGED   Details  acetaminophen (TYLENOL) 325 MG tablet Take 650 mg by mouth 4 (four) times daily.    acetaminophen (TYLENOL) 650 MG suppository Place 650 mg rectally every 6 (six) hours as needed for fever.    baclofen (LIORESAL) 10 MG tablet Take 10 mg by mouth 3 (three) times daily.    calcium carbonate (TUMS - DOSED IN MG ELEMENTAL CALCIUM) 500 MG chewable tablet Chew 1 tablet by mouth daily.    citalopram (CELEXA) 10 MG tablet Take 10 mg by mouth daily.     cloNIDine (CATAPRES) 0.1 MG tablet Take 0.1 mg by mouth daily.    Cranberry 425 MG CAPS Take 425 mg  by mouth 2 (two) times daily.     docusate sodium (COLACE) 100 MG capsule Take 100 mg by mouth 2 (two) times daily.    ipratropium-albuterol (DUONEB) 0.5-2.5 (3) MG/3ML SOLN Take 3 mLs by nebulization every 6 (six) hours as needed. Wheezing and sob    meloxicam (MOBIC) 7.5 MG tablet Take 7.5 mg by mouth daily.    !! metoprolol (LOPRESSOR) 50 MG tablet Take 50 mg by mouth 2 (two) times daily. Along with Metoprolol tartrate  for a total of = 62.5mg  bid    !! metoprolol tartrate (LOPRESSOR) 25 MG tablet Take 12.5 mg by mouth 2 (two) times daily.    Multiple Vitamin (MULITIVITAMIN WITH MINERALS) TABS Take 1 tablet by mouth daily.    polyethylene glycol (MIRALAX / GLYCOLAX) packet Take 17 g by mouth daily.    polyvinyl alcohol (LIQUIFILM TEARS) 1.4 % ophthalmic solution Place 1 drop into both eyes daily.      !! - Potential duplicate medications found. Please discuss with provider.    STOP taking these medications     cephALEXin (KEFLEX) 500 MG capsule      sulfamethoxazole-trimethoprim (SEPTRA DS) 800-160 MG per tablet  No Known Allergies Follow-up Information    Please follow up.   Why:  f/u with MD at SNF       The results of significant diagnostics from this hospitalization (including imaging, microbiology, ancillary and laboratory) are listed below for reference.    Significant Diagnostic Studies: Ct Abdomen Pelvis W Contrast  02/17/2015   CLINICAL DATA:  Abdominal distention and pain  EXAM: CT ABDOMEN AND PELVIS WITH CONTRAST  TECHNIQUE: Multidetector CT imaging of the abdomen and pelvis was performed using the standard protocol following bolus administration of intravenous contrast.  CONTRAST:  OMNIPAQUE IOHEXOL 300 MG/ML  SOLN  COMPARISON:  Radiographs 12/22/2012  FINDINGS: There is a large volume of stool distending the rectum. This could represent fecal impaction. Generous stool volume is present throughout the remainder of the colon as well. There is no  bowel obstruction. Small bowel is unremarkable. Stomach is unremarkable.  There are unremarkable appearances of the liver, spleen, pancreas, adrenals and kidneys.  The abdominal aorta is normal in caliber.  There is a suprapubic urinary bladder catheter.  There is a fat containing left inguinal hernia.  There is linear scarring or atelectasis in the left lung base.  No significant skeletal lesions are evident.  IMPRESSION: *Generous colonic stool volume. Prominent distention of the rectum with stool, suggesting fecal impaction. *Fat containing left inguinal hernia   Electronically Signed   By: Ellery Plunk M.D.   On: 02/17/2015 02:26   Dg Chest Port 1 View  02/16/2015   CLINICAL DATA:  Respiratory distress, shortness of breath, unresponsiveness.  EXAM: PORTABLE CHEST - 1 VIEW  COMPARISON:  Chest radiograph December 22, 2012  FINDINGS: The cardiac silhouette appears mildly enlarged, similar. Mediastinal silhouette is unremarkable for this low inspiratory portable examination with crowded vascular markings. Similar biapical pleural thickening without pleural effusion or focal consolidation. No pneumothorax. Soft tissue planes and included osseous structures are nonsuspicious.  IMPRESSION: Stable mild cardiomegaly, no acute pulmonary process.   Electronically Signed   By: Awilda Metro   On: 02/16/2015 22:50    Microbiology: Recent Results (from the past 240 hour(s))  Culture, blood (routine x 2)     Status: None (Preliminary result)   Collection Time: 02/16/15 10:22 PM  Result Value Ref Range Status   Specimen Description BLOOD LEFT HAND  Final   Special Requests BOTTLES DRAWN AEROBIC AND ANAEROBIC 3CC  Final   Culture   Final           BLOOD CULTURE RECEIVED NO GROWTH TO DATE CULTURE WILL BE HELD FOR 5 DAYS BEFORE ISSUING A FINAL NEGATIVE REPORT Performed at [facility]    Report Status PENDING  Incomplete  Culture, blood (routine x 2)     Status: None   Collection Time: 02/16/15  10:29 PM  Result Value Ref Range Status   Specimen Description BLOOD RIGHT HAND  Final   Special Requests BOTTLES DRAWN AEROBIC AND ANAEROBIC 5CC  Final   Culture   Final    STAPHYLOCOCCUS SPECIES (COAGULASE NEGATIVE) Note: THE SIGNIFICANCE OF ISOLATING THIS ORGANISM FROM A SINGLE SET OF BLOOD CULTURES WHEN MULTIPLE SETS ARE DRAWN IS UNCERTAIN. PLEASE NOTIFY THE MICROBIOLOGY DEPARTMENT WITHIN ONE WEEK IF SPECIATION AND SENSITIVITIES ARE REQUIRED. Note: Gram Stain Report Called to,Read Back By and Verified With: PEACE DORMON ON 5.5.16 AT 10:15P BY WILEJ Performed at [facility]    Report Status 02/19/2015 FINAL  Final  Urine culture     Status: None   Collection  Time: 02/17/15  2:14 AM  Result Value Ref Range Status   Specimen Description URINE, SUPRAPUBIC  Final   Special Requests NONE  Final   Colony Count   Final    >=100,000 COLONIES/ML Performed at [facility]    Culture   Final    ESCHERICHIA COLI PROVIDENCIA STUARTII Performed at [facility]    Report Status 02/20/2015 FINAL  Final   Organism ID, Bacteria ESCHERICHIA COLI  Final   Organism ID, Bacteria PROVIDENCIA STUARTII  Final      Susceptibility   Escherichia coli - MIC*    AMPICILLIN >=32 RESISTANT Resistant     CEFAZOLIN 32 RESISTANT Resistant     CEFTRIAXONE <=1 SENSITIVE Sensitive     CIPROFLOXACIN >=4 RESISTANT Resistant     GENTAMICIN <=1 SENSITIVE Sensitive     LEVOFLOXACIN >=8 RESISTANT Resistant     NITROFURANTOIN <=16 SENSITIVE Sensitive     TOBRAMYCIN <=1 SENSITIVE Sensitive     TRIMETH/SULFA <=20 SENSITIVE Sensitive     PIP/TAZO >=128 RESISTANT Resistant     * ESCHERICHIA COLI   Providencia stuartii - MIC*    AMPICILLIN RESISTANT      CEFAZOLIN >=64 RESISTANT Resistant     CEFTRIAXONE <=1 SENSITIVE Sensitive     CIPROFLOXACIN >=4 RESISTANT Resistant     GENTAMICIN RESISTANT      LEVOFLOXACIN >=8 RESISTANT Resistant     NITROFURANTOIN 128 RESISTANT Resistant      TOBRAMYCIN RESISTANT      TRIMETH/SULFA 40 SENSITIVE Sensitive     PIP/TAZO <=4 SENSITIVE Sensitive     * PROVIDENCIA STUARTII  MRSA PCR Screening     Status: None   Collection Time: 02/17/15  6:19 AM  Result Value Ref Range Status   MRSA by PCR NEGATIVE NEGATIVE Final    Comment:        The GeneXpert MRSA Assay (FDA approved for NASAL specimens only), is one component of a comprehensive MRSA colonization surveillance program. It is not intended to diagnose MRSA infection nor to guide or monitor treatment for MRSA infections.      Labs: Basic Metabolic Panel:  Recent Labs Lab 02/17/15 0843 02/18/15 0400 02/19/15 0555 02/20/15 0701 02/21/15 0755  NA 139 141 141 139 139  K 4.2 3.8 4.0 5.3* 4.2  CL 113* 112* 109 112* 104  CO2 22 22 25  21* 25  GLUCOSE 94 94 94 83 86  BUN 13 9 7 7 11   CREATININE 0.82 0.72 0.72 0.60* 0.77  CALCIUM 7.8* 8.3* 8.7* 8.7* 9.2  MG  --  1.7 2.4  --   --    Liver Function Tests:  Recent Labs Lab 02/17/15 0045  AST 35  ALT 40  ALKPHOS 60  BILITOT 0.6  PROT 6.5  ALBUMIN 3.3*    Recent Labs Lab 02/17/15 0045  LIPASE 18*   No results for input(s): AMMONIA in the last 168 hours. CBC:  Recent Labs Lab 02/17/15 0843 02/18/15 0400 02/19/15 0555 02/20/15 0701 02/21/15 0755  WBC 12.8* 8.0 6.4 6.7 9.7  HGB 12.3* 13.1 14.1 14.7 13.5  HCT 38.2* 41.1 41.7 44.6 41.6  MCV 91.8 91.9 91.0 89.0 90.2  PLT 122* 130* 152 157 171   Cardiac Enzymes: No results for input(s): CKTOTAL, CKMB, CKMBINDEX, TROPONINI in the last 168 hours. BNP: BNP (last 3 results)  Recent Labs  02/16/15 2230  BNP 35.7    ProBNP (last 3 results) No results for input(s): PROBNP in the last 8760 hours.  CBG:  Recent Labs Lab 02/17/15 0804 02/18/15 0747 02/19/15 0755 02/20/15 0750 02/21/15 0801  GLUCAP 78 82 83 86 86       Signed:  Kristiana Jacko MD [facility] 02/21/2015, 11:11 AM

## 2015-02-23 LAB — CULTURE, BLOOD (ROUTINE X 2): Culture: NO GROWTH

## 2015-04-05 ENCOUNTER — Encounter: Payer: Self-pay | Admitting: Podiatry

## 2015-04-05 ENCOUNTER — Ambulatory Visit (INDEPENDENT_AMBULATORY_CARE_PROVIDER_SITE_OTHER): Payer: Medicare Other | Admitting: Podiatry

## 2015-04-05 VITALS — BP 158/72 | HR 63 | Resp 16

## 2015-04-05 DIAGNOSIS — B351 Tinea unguium: Secondary | ICD-10-CM | POA: Diagnosis not present

## 2015-04-05 DIAGNOSIS — M79606 Pain in leg, unspecified: Secondary | ICD-10-CM

## 2015-04-05 DIAGNOSIS — L03032 Cellulitis of left toe: Secondary | ICD-10-CM

## 2015-04-05 NOTE — Progress Notes (Signed)
   Subjective:    Patient ID: MILBURN FREENEY, male    DOB: 09-26-1951, 64 y.o.   MRN: [id number]  HPI Comments: "He needs to have those two toes looked at"  Patient presents with caregiver from nursing facility c/o bleeding 1st and 2nd toes left for several weeks. The nails are thick and long and are bleeding around the nail bed. There is an odor and some drainage. The nursing facility has been wrapping them.    Toe Pain       Review of Systems  Constitutional: Positive for activity change and fatigue.  Genitourinary:       Catheter   Musculoskeletal: Positive for myalgias and arthralgias.  Neurological: Positive for speech difficulty.  All other systems reviewed and are negative.      Objective:   Physical Exam: I have reviewed his past medical history medications allergies surgery social history. Pulses are palpable bilateral neurologic sensorium is lost secondary to his history of MS. Cutaneous evaluation does demonstrate moderate to severe paronychia hallux left. Severely elongated nails bilateral. There is no purulence no malodor.      Assessment & Plan:  Assessment: Subungual abscess hallux left. Elongated nails 1 through 5 bilateral. With onychomycosis.  Plan: Nail avulsion with IND and resection of all necrotic tissue hallux left. Debridement of nails bilateral.

## 2015-04-05 NOTE — Patient Instructions (Signed)

## 2016-08-15 DEATH — deceased
